# Patient Record
Sex: Female | Born: 1985
Health system: Southern US, Community
[De-identification: ages and names within clinical notes are randomized; demographics above are authoritative.]

## PROBLEM LIST (undated history)

## (undated) ENCOUNTER — Inpatient Hospital Stay (HOSPITAL_COMMUNITY): Payer: Self-pay

## (undated) DIAGNOSIS — K829 Disease of gallbladder, unspecified: Secondary | ICD-10-CM

## (undated) DIAGNOSIS — K219 Gastro-esophageal reflux disease without esophagitis: Secondary | ICD-10-CM

---

## 2010-09-18 ENCOUNTER — Ambulatory Visit: Payer: Self-pay | Admitting: Nurse Practitioner

## 2010-09-18 ENCOUNTER — Inpatient Hospital Stay (HOSPITAL_COMMUNITY): Admission: AD | Admit: 2010-09-18 | Discharge: 2010-09-18 | Payer: Self-pay | Admitting: Obstetrics & Gynecology

## 2011-03-01 LAB — URINALYSIS, ROUTINE W REFLEX MICROSCOPIC
Bilirubin Urine: NEGATIVE
Glucose, UA: NEGATIVE mg/dL
Hgb urine dipstick: NEGATIVE
Ketones, ur: NEGATIVE mg/dL
Nitrite: NEGATIVE
Protein, ur: NEGATIVE mg/dL
Specific Gravity, Urine: >1.03 — ABNORMAL HIGH (ref 1.005–1.030)
Urobilinogen, UA: 1 mg/dL (ref 0.0–1.0)
pH: 6 (ref 5.0–8.0)

## 2011-03-01 LAB — CBC
HCT: 32 % — ABNORMAL LOW (ref 36.0–46.0)
Hemoglobin: 10.6 g/dL — ABNORMAL LOW (ref 12.0–15.0)
MCH: 23 pg — ABNORMAL LOW (ref 26.0–34.0)
MCHC: 33 g/dL (ref 30.0–36.0)
MCV: 69.9 fL — ABNORMAL LOW (ref 78.0–100.0)
Platelets: 174 10*3/uL (ref 150–400)
RBC: 4.58 MIL/uL (ref 3.87–5.11)
RDW: 18.2 % — ABNORMAL HIGH (ref 11.5–15.5)
WBC: 5.5 10*3/uL (ref 4.0–10.5)

## 2011-03-01 LAB — DIFFERENTIAL
Basophils Absolute: 0 10*3/uL (ref 0.0–0.1)
Basophils Relative: 1 % (ref 0–1)
Eosinophils Absolute: 0.1 10*3/uL (ref 0.0–0.7)
Eosinophils Relative: 1 % (ref 0–5)
Lymphocytes Relative: 44 % (ref 12–46)
Lymphs Abs: 2.4 10*3/uL (ref 0.7–4.0)
Monocytes Absolute: 0.4 10*3/uL (ref 0.1–1.0)
Monocytes Relative: 8 % (ref 3–12)
Neutro Abs: 2.5 10*3/uL (ref 1.7–7.7)
Neutrophils Relative %: 47 % (ref 43–77)

## 2011-03-01 LAB — WET PREP, GENITAL
Clue Cells Wet Prep HPF POC: NONE SEEN
Trich, Wet Prep: NONE SEEN
Yeast Wet Prep HPF POC: NONE SEEN

## 2011-03-01 LAB — GC/CHLAMYDIA PROBE AMP, GENITAL
Chlamydia, DNA Probe: NEGATIVE
GC Probe Amp, Genital: NEGATIVE

## 2011-03-01 LAB — POCT PREGNANCY, URINE: Preg Test, Ur: NEGATIVE

## 2011-05-20 ENCOUNTER — Emergency Department (HOSPITAL_COMMUNITY)
Admission: EM | Admit: 2011-05-20 | Discharge: 2011-05-21 | Disposition: A | Discharge status: Home / Self Care | Payer: Self-pay | Attending: Emergency Medicine | Admitting: Emergency Medicine

## 2011-05-20 DIAGNOSIS — R6883 Chills (without fever): Secondary | ICD-10-CM | POA: Insufficient documentation

## 2011-05-20 DIAGNOSIS — K8020 Calculus of gallbladder without cholecystitis without obstruction: Secondary | ICD-10-CM | POA: Insufficient documentation

## 2011-05-20 DIAGNOSIS — R110 Nausea: Secondary | ICD-10-CM | POA: Insufficient documentation

## 2011-05-20 DIAGNOSIS — R1011 Right upper quadrant pain: Secondary | ICD-10-CM | POA: Insufficient documentation

## 2011-05-21 ENCOUNTER — Emergency Department (HOSPITAL_COMMUNITY): Payer: Self-pay

## 2011-05-21 LAB — DIFFERENTIAL
Basophils Absolute: 0 10*3/uL (ref 0.0–0.1)
Basophils Relative: 0 % (ref 0–1)
Eosinophils Relative: 1 % (ref 0–5)
Lymphocytes Relative: 33 % (ref 12–46)
Lymphs Abs: 2.3 10*3/uL (ref 0.7–4.0)
Monocytes Absolute: 0.5 10*3/uL (ref 0.1–1.0)
Monocytes Relative: 6 % (ref 3–12)
Neutro Abs: 4.2 10*3/uL (ref 1.7–7.7)
Neutrophils Relative %: 60 % (ref 43–77)

## 2011-05-21 LAB — CBC
HCT: 34.8 % — ABNORMAL LOW (ref 36.0–46.0)
Platelets: 174 10*3/uL (ref 150–400)
RBC: 4.5 MIL/uL (ref 3.87–5.11)
RDW: 14.7 % (ref 11.5–15.5)
WBC: 7 10*3/uL (ref 4.0–10.5)

## 2011-05-21 LAB — COMPREHENSIVE METABOLIC PANEL
ALT: 19 U/L (ref 0–35)
AST: 20 U/L (ref 0–37)
Albumin: 3.5 g/dL (ref 3.5–5.2)
Alkaline Phosphatase: 91 U/L (ref 39–117)
BUN: 9 mg/dL (ref 6–23)
CO2: 25 mEq/L (ref 19–32)
Calcium: 8.7 mg/dL (ref 8.4–10.5)
Chloride: 105 mEq/L (ref 96–112)
GFR calc Af Amer: >60 mL/min (ref >60)
GFR calc non Af Amer: >60 mL/min (ref >60)
Glucose, Bld: 95 mg/dL (ref 70–99)
Potassium: 3.7 mEq/L (ref 3.5–5.1)
Sodium: 136 mEq/L (ref 135–145)
Total Bilirubin: 0.3 mg/dL (ref 0.3–1.2)
Total Protein: 7.1 g/dL (ref 6.0–8.3)

## 2011-05-21 LAB — LIPASE, BLOOD: Lipase: 30 U/L (ref 11–59)

## 2011-05-21 LAB — URINALYSIS, ROUTINE W REFLEX MICROSCOPIC
Bilirubin Urine: NEGATIVE
Glucose, UA: NEGATIVE mg/dL
Hgb urine dipstick: NEGATIVE
Ketones, ur: NEGATIVE mg/dL
Nitrite: NEGATIVE
Protein, ur: NEGATIVE mg/dL
Specific Gravity, Urine: 1.025 (ref 1.005–1.030)
Urobilinogen, UA: 0.2 mg/dL (ref 0.0–1.0)
pH: 6 (ref 5.0–8.0)

## 2011-05-21 LAB — POCT PREGNANCY, URINE: Preg Test, Ur: NEGATIVE

## 2011-05-21 LAB — URINE MICROSCOPIC-ADD ON

## 2011-09-04 ENCOUNTER — Ambulatory Visit (INDEPENDENT_AMBULATORY_CARE_PROVIDER_SITE_OTHER): Payer: Self-pay | Admitting: Surgery

## 2011-11-15 LAB — OB RESULTS CONSOLE ABO/RH: RH Type: POSITIVE

## 2011-11-15 LAB — OB RESULTS CONSOLE HEPATITIS B SURFACE ANTIGEN: Hepatitis B Surface Ag: NEGATIVE

## 2011-11-15 LAB — OB RESULTS CONSOLE RPR: RPR: NONREACTIVE

## 2011-11-15 LAB — OB RESULTS CONSOLE ANTIBODY SCREEN: Antibody Screen: NEGATIVE

## 2011-11-15 LAB — OB RESULTS CONSOLE RUBELLA ANTIBODY, IGM: Rubella: IMMUNE

## 2011-12-18 NOTE — L&D Delivery Note (Signed)
Delivery Note At 1:03 PM a viable female was delivered via Vaginal, Spontaneous Delivery (Presentation: ;  ).  APGAR: , ; weight .   Placenta status: , .  Cord:  with the following complications: .  Cord pH: not done  Anesthesia: Epidural  Episiotomy:  Lacerations:  Suture Repair: 2.0 chromic Est. Blood Loss (mL):   Mom to postpartum.  Baby to nursery-stable.  Boubacar Lerette A 05/15/2012, 1:11 PM

## 2012-04-10 LAB — OB RESULTS CONSOLE GBS: GBS: NEGATIVE

## 2012-05-15 ENCOUNTER — Encounter (HOSPITAL_COMMUNITY): Payer: Self-pay | Admitting: *Deleted

## 2012-05-15 ENCOUNTER — Inpatient Hospital Stay (HOSPITAL_COMMUNITY)
Admission: AD | Admit: 2012-05-15 | Discharge: 2012-05-17 | DRG: 775 | Disposition: A | Discharge status: Home / Self Care | Payer: Medicaid Other | Source: Ambulatory Visit | Attending: Obstetrics | Admitting: Obstetrics

## 2012-05-15 ENCOUNTER — Encounter (HOSPITAL_COMMUNITY): Payer: Self-pay | Admitting: Anesthesiology

## 2012-05-15 ENCOUNTER — Inpatient Hospital Stay (HOSPITAL_COMMUNITY): Payer: Medicaid Other | Admitting: Anesthesiology

## 2012-05-15 LAB — CBC
HCT: 29.9 % — ABNORMAL LOW (ref 36.0–46.0)
Hemoglobin: 9.3 g/dL — ABNORMAL LOW (ref 12.0–15.0)
MCHC: 31.1 g/dL (ref 30.0–36.0)

## 2012-05-15 MED ORDER — ZOLPIDEM TARTRATE 5 MG PO TABS: 5.0000 mg | ORAL_TABLET | Freq: Every evening | ORAL | Status: DC | PRN

## 2012-05-15 MED ORDER — LIDOCAINE HCL (PF) 1 % IJ SOLN
INTRAMUSCULAR | Status: DC | PRN
  Administered 2012-05-15 (×2): 8 mL

## 2012-05-15 MED ORDER — DIPHENHYDRAMINE HCL 25 MG PO CAPS: 25.0000 mg | ORAL_CAPSULE | Freq: Four times a day (QID) | ORAL | Status: DC | PRN

## 2012-05-15 MED ORDER — WITCH HAZEL-GLYCERIN EX PADS: 1.0000 "application " | MEDICATED_PAD | CUTANEOUS | Status: DC | PRN

## 2012-05-15 MED ORDER — LIDOCAINE HCL (PF) 1 % IJ SOLN
30.0000 mL | INTRAMUSCULAR | Status: DC | PRN
  Filled 2012-05-15: qty 30

## 2012-05-15 MED ORDER — OXYTOCIN BOLUS FROM INFUSION
500.0000 mL | Freq: Once | INTRAVENOUS | Status: DC
  Filled 2012-05-15: qty 500
  Filled 2012-05-15: qty 1000

## 2012-05-15 MED ORDER — PHENYLEPHRINE 40 MCG/ML (10ML) SYRINGE FOR IV PUSH (FOR BLOOD PRESSURE SUPPORT): 80.0000 ug | PREFILLED_SYRINGE | INTRAVENOUS | Status: DC | PRN

## 2012-05-15 MED ORDER — IBUPROFEN 600 MG PO TABS
600.0000 mg | ORAL_TABLET | Freq: Four times a day (QID) | ORAL | Status: DC
  Administered 2012-05-15 – 2012-05-17 (×6): 600 mg via ORAL
  Filled 2012-05-15 (×2): qty 1

## 2012-05-15 MED ORDER — ONDANSETRON HCL 4 MG/2ML IJ SOLN: 4.0000 mg | INTRAMUSCULAR | Status: DC | PRN

## 2012-05-15 MED ORDER — ACETAMINOPHEN 325 MG PO TABS: 650.0000 mg | ORAL_TABLET | ORAL | Status: DC | PRN

## 2012-05-15 MED ORDER — EPHEDRINE 5 MG/ML INJ: 10.0000 mg | INTRAVENOUS | Status: DC | PRN

## 2012-05-15 MED ORDER — DIPHENHYDRAMINE HCL 50 MG/ML IJ SOLN: 12.5000 mg | INTRAMUSCULAR | Status: DC | PRN

## 2012-05-15 MED ORDER — ONDANSETRON HCL 4 MG PO TABS: 4.0000 mg | ORAL_TABLET | ORAL | Status: DC | PRN

## 2012-05-15 MED ORDER — SENNOSIDES-DOCUSATE SODIUM 8.6-50 MG PO TABS
2.0000 | ORAL_TABLET | Freq: Every day | ORAL | Status: DC
  Administered 2012-05-15: 2 via ORAL

## 2012-05-15 MED ORDER — FLEET ENEMA 7-19 GM/118ML RE ENEM: 1.0000 | ENEMA | RECTAL | Status: DC | PRN

## 2012-05-15 MED ORDER — OXYTOCIN 20 UNITS IN LACTATED RINGERS INFUSION - SIMPLE
125.0000 mL/h | Freq: Once | INTRAVENOUS | Status: AC
  Administered 2012-05-15: 12999 mL/h via INTRAVENOUS

## 2012-05-15 MED ORDER — DIBUCAINE 1 % RE OINT: 1.0000 "application " | TOPICAL_OINTMENT | RECTAL | Status: DC | PRN

## 2012-05-15 MED ORDER — LACTATED RINGERS IV SOLN
INTRAVENOUS | Status: DC
  Administered 2012-05-15 (×3): via INTRAVENOUS

## 2012-05-15 MED ORDER — FENTANYL 2.5 MCG/ML BUPIVACAINE 1/10 % EPIDURAL INFUSION (WH - ANES)
14.0000 mL/h | INTRAMUSCULAR | Status: DC
  Administered 2012-05-15: 14 mL/h via EPIDURAL
  Filled 2012-05-15 (×2): qty 60

## 2012-05-15 MED ORDER — PRENATAL MULTIVITAMIN CH
1.0000 | ORAL_TABLET | Freq: Every day | ORAL | Status: DC
  Administered 2012-05-16 – 2012-05-17 (×2): 1 via ORAL
  Filled 2012-05-15 (×2): qty 1

## 2012-05-15 MED ORDER — OXYCODONE-ACETAMINOPHEN 5-325 MG PO TABS: 1.0000 | ORAL_TABLET | ORAL | Status: DC | PRN

## 2012-05-15 MED ORDER — FERROUS SULFATE 325 (65 FE) MG PO TABS
325.0000 mg | ORAL_TABLET | Freq: Two times a day (BID) | ORAL | Status: DC
  Administered 2012-05-16 – 2012-05-17 (×3): 325 mg via ORAL
  Filled 2012-05-15 (×3): qty 1

## 2012-05-15 MED ORDER — CITRIC ACID-SODIUM CITRATE 334-500 MG/5ML PO SOLN: 30.0000 mL | ORAL | Status: DC | PRN

## 2012-05-15 MED ORDER — FENTANYL 2.5 MCG/ML BUPIVACAINE 1/10 % EPIDURAL INFUSION (WH - ANES)
INTRAMUSCULAR | Status: DC | PRN
  Administered 2012-05-15: 14 mL/h via EPIDURAL

## 2012-05-15 MED ORDER — IBUPROFEN 600 MG PO TABS
600.0000 mg | ORAL_TABLET | Freq: Four times a day (QID) | ORAL | Status: DC | PRN
  Filled 2012-05-15 (×5): qty 1

## 2012-05-15 MED ORDER — ONDANSETRON HCL 4 MG/2ML IJ SOLN: 4.0000 mg | Freq: Four times a day (QID) | INTRAMUSCULAR | Status: DC | PRN

## 2012-05-15 MED ORDER — LACTATED RINGERS IV SOLN: 500.0000 mL | INTRAVENOUS | Status: DC | PRN

## 2012-05-15 MED ORDER — BUTORPHANOL TARTRATE 2 MG/ML IJ SOLN: 1.0000 mg | INTRAMUSCULAR | Status: DC | PRN

## 2012-05-15 MED ORDER — BENZOCAINE-MENTHOL 20-0.5 % EX AERO: 1.0000 "application " | INHALATION_SPRAY | CUTANEOUS | Status: DC | PRN

## 2012-05-15 MED ORDER — LANOLIN HYDROUS EX OINT: TOPICAL_OINTMENT | CUTANEOUS | Status: DC | PRN

## 2012-05-15 MED ORDER — TETANUS-DIPHTH-ACELL PERTUSSIS 5-2.5-18.5 LF-MCG/0.5 IM SUSP
0.5000 mL | Freq: Once | INTRAMUSCULAR | Status: AC
  Administered 2012-05-17: 0.5 mL via INTRAMUSCULAR

## 2012-05-15 MED ORDER — SIMETHICONE 80 MG PO CHEW: 80.0000 mg | CHEWABLE_TABLET | ORAL | Status: DC | PRN

## 2012-05-15 MED ORDER — EPHEDRINE 5 MG/ML INJ
10.0000 mg | INTRAVENOUS | Status: DC | PRN
  Filled 2012-05-15: qty 4

## 2012-05-15 MED ORDER — LACTATED RINGERS IV SOLN: 500.0000 mL | Freq: Once | INTRAVENOUS | Status: DC

## 2012-05-15 MED ORDER — PHENYLEPHRINE 40 MCG/ML (10ML) SYRINGE FOR IV PUSH (FOR BLOOD PRESSURE SUPPORT)
80.0000 ug | PREFILLED_SYRINGE | INTRAVENOUS | Status: DC | PRN
  Filled 2012-05-15: qty 5

## 2012-05-15 NOTE — MAU Note (Signed)
Pt reports per interpreter she has been having contractions since midnight , also reports some bloody discharge. Reports good fetal movement. Denies problems with pregnancy

## 2012-05-15 NOTE — Progress Notes (Signed)
[facility] Department of Clinical Social Work Documentation of Interpretation   I assisted __Lyz RN_________________ with interpretation of _admission_____________________ for this patient.

## 2012-05-15 NOTE — Anesthesia Procedure Notes (Signed)
Epidural Patient location during procedure: OB Start time: 05/15/2012 9:23 AM End time: 05/15/2012 9:27 AM Reason for block: procedure for pain  Staffing Anesthesiologist: Sandrea Hughs  Preanesthetic Checklist Completed: patient identified, site marked, surgical consent, pre-op evaluation, timeout performed, IV checked, risks and benefits discussed and monitors and equipment checked  Epidural Patient position: sitting Prep: site prepped and draped and DuraPrep Patient monitoring: continuous pulse ox and blood pressure Approach: midline Injection technique: LOR air  Needle:  Needle type: Tuohy  Needle gauge: 17 G Needle length: 9 cm Needle insertion depth: 5 cm cm Catheter type: closed end flexible Catheter size: 19 Gauge Catheter at skin depth: 10 cm Test dose: negative and Other  Assessment Sensory level: T9 Events: blood not aspirated, injection not painful, no injection resistance, negative IV test and no paresthesia

## 2012-05-15 NOTE — Progress Notes (Signed)
[facility] Department of Clinical Social Work Documentation of Interpretation   I assisted __Dr Hatchett_________________ with interpretation of _epidural_____________________ for this patient. 

## 2012-05-15 NOTE — H&P (Signed)
This is Dr. Francoise Ceo dictating the history and physical on  Amy Lloyd she's a 27 year old gravida 2 para 1001 who is due date is 05/14/2012 she's a 40 and 1 and was admitted in labor negative GBS her cervix is now 5-6 cm dilated -3 station vertex 90% effaced AROM performed thin meconium she has an epidural Past medical history negative Past surgical history negative Social history denies smoking or alcohol use or drug use System review negative Physical exam HEENT negative Breasts negative Lungs clear Heart regular rhythm no murmurs no gallops Abdomen term Pelvic as described above

## 2012-05-15 NOTE — Anesthesia Preprocedure Evaluation (Signed)
Anesthesia Evaluation  Patient identified by MRN, date of birth, ID band Patient awake    Reviewed: Allergy & Precautions, H&P , NPO status , Patient's Chart, lab work & pertinent test results  Airway Mallampati: III TM Distance: >3 FB Neck ROM: full    Dental No notable dental hx.    Pulmonary neg pulmonary ROS,    Pulmonary exam normal       Cardiovascular negative cardio ROS      Neuro/Psych negative neurological ROS  negative psych ROS   GI/Hepatic negative GI ROS, Neg liver ROS,   Endo/Other  Morbid obesity  Renal/GU negative Renal ROS  negative genitourinary   Musculoskeletal   Abdominal (+) + obese,   Peds  Hematology negative hematology ROS (+)   Anesthesia Other Findings   Reproductive/Obstetrics (+) Pregnancy                           Anesthesia Physical Anesthesia Plan  ASA: III  Anesthesia Plan: Epidural   Post-op Pain Management:    Induction:   Airway Management Planned:   Additional Equipment:   Intra-op Plan:   Post-operative Plan:   Informed Consent: I have reviewed the patients History and Physical, chart, labs and discussed the procedure including the risks, benefits and alternatives for the proposed anesthesia with the patient or authorized representative who has indicated his/her understanding and acceptance.     Plan Discussed with:   Anesthesia Plan Comments:         Anesthesia Quick Evaluation

## 2012-05-15 NOTE — Progress Notes (Signed)
[facility] Department of Clinical Social Work Documentation of Interpretation   I assisted ___Judy  RN________________ with interpretation of ____questions__________________ for this patient. 

## 2012-05-15 NOTE — Progress Notes (Signed)
[facility] Department of Clinical Social Work Documentation of Interpretation   I assisted ____Anita RN______________ with interpretation of ____questions__________________ for this patient.

## 2012-05-16 LAB — CBC
HCT: 28 % — ABNORMAL LOW (ref 36.0–46.0)
Hemoglobin: 8.8 g/dL — ABNORMAL LOW (ref 12.0–15.0)
RBC: 3.81 MIL/uL — ABNORMAL LOW (ref 3.87–5.11)
WBC: 9.6 10*3/uL (ref 4.0–10.5)

## 2012-05-16 NOTE — Progress Notes (Signed)
UR chart review completed.  

## 2012-05-16 NOTE — Anesthesia Postprocedure Evaluation (Signed)
Anesthesia Post Note  Patient: Amy Lloyd  Procedure(s) Performed: * No procedures listed *  Anesthesia type: Epidural  Patient location: Mother/Baby  Post pain: Pain level controlled  Post assessment: Post-op Vital signs reviewed  Last Vitals:  Filed Vitals:   05/16/12 0527  BP: 118/78  Pulse: 83  Temp: 36.8 C  Resp: 18    Post vital signs: Reviewed  Level of consciousness: awake  Complications: No apparent anesthesia complications

## 2012-05-16 NOTE — Progress Notes (Signed)
Patient ID: Amy Lloyd, female   DOB: [birth year], 26 y.o.   MRN: [id number] Postpartum day one Vital signs normal Fundus firm Lochia moderate Legs negative

## 2012-05-16 NOTE — Progress Notes (Signed)
[facility] Department of Clinical Social Work Documentation of Interpretation   I assisted ___________________ with interpretation of _stopped by to check on patient_____________________ for this patient. 

## 2012-05-17 MED ORDER — IBUPROFEN 600 MG PO TABS: 600.0000 mg | ORAL_TABLET | Freq: Four times a day (QID) | ORAL | Status: DC

## 2012-05-17 MED ORDER — OXYCODONE-ACETAMINOPHEN 5-325 MG PO TABS: 1.0000 | ORAL_TABLET | ORAL | Status: AC | PRN

## 2012-05-17 NOTE — Progress Notes (Signed)
Post Partum Day 2 Subjective: no complaints  Objective: Blood pressure 109/75, pulse 85, temperature 98 F (36.7 C), temperature source Oral, resp. rate 18, height 5\' 1"  (1.549 m), weight 83.008 kg (183 lb), SpO2 98.00%, unknown if currently breastfeeding.  Physical Exam:  General: alert and no distress Lochia: appropriate Uterine Fundus: firm Incision: healing well DVT Evaluation: No evidence of DVT seen on physical exam.   Basename 05/16/12 0010 05/15/12 0825  HGB 8.8* 9.3*  HCT 28.0* 29.9*    Assessment/Plan: Discharge home   LOS: 2 days   Adric Wrede A 05/17/2012, 10:07 AM

## 2012-05-17 NOTE — Discharge Summary (Signed)
Obstetric Discharge Summary Reason for Admission: onset of labor Prenatal Procedures: ultrasound Intrapartum Procedures: spontaneous vaginal delivery Postpartum Procedures: none Complications-Operative and Postpartum: none Hemoglobin  Date Value Range Status  05/16/2012 8.8* 12.0-15.0 (g/dL) Final     HCT  Date Value Range Status  05/16/2012 28.0* 36.0-46.0 (%) Final    Physical Exam:  General: alert and no distress Lochia: appropriate Uterine Fundus: firm Incision: healing well DVT Evaluation: No evidence of DVT seen on physical exam.  Discharge Diagnoses: Term Pregnancy-delivered  Discharge Information: Date: 05/17/2012 Activity: pelvic rest Diet: routine Medications: PNV, Ibuprofen, Colace and Percocet Condition: stable Instructions: refer to practice specific booklet Discharge to: home Follow-up Information    Follow up with MARSHALL,BERNARD A, MD. Schedule an appointment as soon as possible for a visit in 6 weeks.   Contact information:   [address] [place] [address] [phone]          Newborn Data: Live born female  Birth Weight: 6 lb 8.1 oz (2951 g) APGAR: 9, 9  Home with mother.  Amy Lloyd A 05/17/2012, 10:13 AM

## 2013-10-28 ENCOUNTER — Encounter (HOSPITAL_COMMUNITY): Payer: Self-pay | Admitting: Emergency Medicine

## 2013-10-28 DIAGNOSIS — R1011 Right upper quadrant pain: Secondary | ICD-10-CM | POA: Insufficient documentation

## 2013-10-28 DIAGNOSIS — R112 Nausea with vomiting, unspecified: Secondary | ICD-10-CM | POA: Insufficient documentation

## 2013-10-28 LAB — CBC WITH DIFFERENTIAL/PLATELET
Eosinophils Relative: 1 % (ref 0–5)
HCT: 39.1 % (ref 36.0–46.0)
Lymphocytes Relative: 32 % (ref 12–46)
Lymphs Abs: 2.6 10*3/uL (ref 0.7–4.0)
MCV: 84.1 fL (ref 78.0–100.0)
Monocytes Absolute: 0.5 10*3/uL (ref 0.1–1.0)
RBC: 4.65 MIL/uL (ref 3.87–5.11)
WBC: 8.2 10*3/uL (ref 4.0–10.5)

## 2013-10-28 LAB — COMPREHENSIVE METABOLIC PANEL
ALT: 21 U/L (ref 0–35)
CO2: 27 mEq/L (ref 19–32)
Calcium: 9.1 mg/dL (ref 8.4–10.5)
Chloride: 102 mEq/L (ref 96–112)
GFR calc Af Amer: >90 mL/min (ref >90)
GFR calc non Af Amer: >90 mL/min (ref >90)
Glucose, Bld: 90 mg/dL (ref 70–99)
Sodium: 138 mEq/L (ref 135–145)
Total Bilirubin: 0.4 mg/dL (ref 0.3–1.2)

## 2013-10-28 NOTE — ED Notes (Signed)
Pt c/o RUQ abdominal pain that started this morning, with nausea and vomiting. Pain radiates to back.

## 2013-10-29 ENCOUNTER — Emergency Department (HOSPITAL_COMMUNITY)
Admission: EM | Admit: 2013-10-29 | Discharge: 2013-10-29 | Discharge status: Against Medical Advice | Payer: Medicaid Other | Attending: Emergency Medicine | Admitting: Emergency Medicine

## 2014-10-18 ENCOUNTER — Encounter (HOSPITAL_COMMUNITY): Payer: Self-pay | Admitting: Emergency Medicine

## 2015-09-25 ENCOUNTER — Emergency Department (HOSPITAL_COMMUNITY): Payer: Medicaid Other

## 2015-09-25 ENCOUNTER — Encounter (HOSPITAL_COMMUNITY): Payer: Self-pay | Admitting: Emergency Medicine

## 2015-09-25 ENCOUNTER — Observation Stay (HOSPITAL_COMMUNITY)
Admission: EM | Admit: 2015-09-25 | Discharge: 2015-09-27 | Disposition: A | Discharge status: Home / Self Care | Payer: Medicaid Other | Attending: General Surgery | Admitting: General Surgery

## 2015-09-25 DIAGNOSIS — K353 Acute appendicitis with localized peritonitis: Secondary | ICD-10-CM | POA: Diagnosis not present

## 2015-09-25 DIAGNOSIS — K3533 Acute appendicitis with perforation and localized peritonitis, with abscess: Secondary | ICD-10-CM | POA: Diagnosis present

## 2015-09-25 DIAGNOSIS — R109 Unspecified abdominal pain: Secondary | ICD-10-CM | POA: Diagnosis present

## 2015-09-25 DIAGNOSIS — R1084 Generalized abdominal pain: Secondary | ICD-10-CM

## 2015-09-25 HISTORY — DX: Disease of gallbladder, unspecified: K82.9

## 2015-09-25 LAB — COMPREHENSIVE METABOLIC PANEL
ALBUMIN: 3.6 g/dL (ref 3.5–5.0)
ALT: 22 U/L (ref 14–54)
ANION GAP: 8 (ref 5–15)
AST: 31 U/L (ref 15–41)
Alkaline Phosphatase: 87 U/L (ref 38–126)
BUN: 12 mg/dL (ref 6–20)
CHLORIDE: 100 mmol/L — AB (ref 101–111)
CO2: 27 mmol/L (ref 22–32)
Calcium: 8.9 mg/dL (ref 8.9–10.3)
Creatinine, Ser: 0.81 mg/dL (ref 0.44–1.00)
GFR calc Af Amer: >60 mL/min (ref >60)
GFR calc non Af Amer: >60 mL/min (ref >60)
GLUCOSE: 101 mg/dL — AB (ref 65–99)
POTASSIUM: 3.4 mmol/L — AB (ref 3.5–5.1)
SODIUM: 135 mmol/L (ref 135–145)
Total Bilirubin: 0.7 mg/dL (ref 0.3–1.2)
Total Protein: 7.3 g/dL (ref 6.5–8.1)

## 2015-09-25 LAB — CBC
HCT: 40.9 % (ref 36.0–46.0)
Hemoglobin: 14 g/dL (ref 12.0–15.0)
MCH: 29.7 pg (ref 26.0–34.0)
MCHC: 34.2 g/dL (ref 30.0–36.0)
MCV: 86.8 fL (ref 78.0–100.0)
PLATELETS: 180 10*3/uL (ref 150–400)
RBC: 4.71 MIL/uL (ref 3.87–5.11)
RDW: 12.1 % (ref 11.5–15.5)
WBC: 9.5 10*3/uL (ref 4.0–10.5)

## 2015-09-25 LAB — URINE MICROSCOPIC-ADD ON

## 2015-09-25 LAB — URINALYSIS, ROUTINE W REFLEX MICROSCOPIC
BILIRUBIN URINE: NEGATIVE
Glucose, UA: NEGATIVE mg/dL
KETONES UR: NEGATIVE mg/dL
NITRITE: NEGATIVE
PH: 7.5 (ref 5.0–8.0)
PROTEIN: NEGATIVE mg/dL
Specific Gravity, Urine: 1.02 (ref 1.005–1.030)
Urobilinogen, UA: 1 mg/dL (ref 0.0–1.0)

## 2015-09-25 LAB — LIPASE, BLOOD: Lipase: 25 U/L (ref 22–51)

## 2015-09-25 LAB — POC URINE PREG, ED: Preg Test, Ur: NEGATIVE

## 2015-09-25 MED ORDER — PIPERACILLIN-TAZOBACTAM 3.375 G IVPB 30 MIN
3.3750 g | Freq: Once | INTRAVENOUS | Status: AC
  Administered 2015-09-25: 3.375 g via INTRAVENOUS
  Filled 2015-09-25: qty 50

## 2015-09-25 MED ORDER — MORPHINE SULFATE (PF) 4 MG/ML IV SOLN
4.0000 mg | Freq: Once | INTRAVENOUS | Status: AC
  Administered 2015-09-25: 4 mg via INTRAVENOUS
  Filled 2015-09-25: qty 1

## 2015-09-25 MED ORDER — SODIUM CHLORIDE 0.9 % IV BOLUS (SEPSIS)
1000.0000 mL | Freq: Once | INTRAVENOUS | Status: AC
  Administered 2015-09-25: 1000 mL via INTRAVENOUS

## 2015-09-25 MED ORDER — IOHEXOL 300 MG/ML  SOLN
100.0000 mL | Freq: Once | INTRAMUSCULAR | Status: AC | PRN
  Administered 2015-09-25: 100 mL via INTRAVENOUS

## 2015-09-25 MED ORDER — ONDANSETRON HCL 4 MG/2ML IJ SOLN
4.0000 mg | Freq: Once | INTRAMUSCULAR | Status: AC
  Administered 2015-09-25: 4 mg via INTRAVENOUS
  Filled 2015-09-25: qty 2

## 2015-09-25 NOTE — ED Notes (Signed)
Pt c/o abdominal pain onset 0300 today. Pt denies N/V.

## 2015-09-25 NOTE — ED Provider Notes (Signed)
CSN: [id number]     Arrival date & time 09/25/15  1711 History   First MD Initiated Contact with Patient 09/25/15 1953     Chief Complaint  Patient presents with  . Abdominal Pain   Amy Lloyd is a 29 y.o. female with no significant past medical history who presents for abdominal pain and nausea. The patient reports that approximately 3 AM this morning, the patient was awoken by sudden onset abdominal pain. The patient scrubbed the pain as going all the way across her lower abdomen. The patient describes the pain as an 8 out of 10 in severity, and nonradiating. The patient describes the pain as sharp The patient denies fevers, chills, constipation, diarrhea, or dysuria. The patient does report that she has had some intermittent vaginal bleeding this month. The patient denies any vaginal bleeding today. The patient says she is having nausea but no emesis. The patient's initial never felt this type of pain before. The patient denies any abdominal trauma, or any other symptoms on arrival.   (Consider location/radiation/quality/duration/timing/severity/associated sxs/prior Treatment) Patient is a 29 y.o. female presenting with abdominal pain. The history is provided by the patient. The history is limited by a language barrier. A language interpreter was used (Spanish interpreter).  Abdominal Pain Pain location:  Suprapubic, LLQ and RLQ Pain quality: sharp   Pain radiates to:  Does not radiate Pain severity:  Severe Onset quality:  Sudden Duration:  1 day Timing:  Constant Progression:  Worsening Chronicity:  New Context: not previous surgeries   Relieved by:  Nothing Ineffective treatments:  None tried Associated symptoms: anorexia, nausea and vaginal bleeding (earlier this month but not today)   Associated symptoms: no chest pain, no chills, no constipation, no cough, no diarrhea, no dysuria, no fatigue, no fever, no hematemesis, no shortness of breath, no vaginal discharge and no  vomiting   Nausea:    Severity:  Moderate   Onset quality:  Gradual   Duration:  1 day   Timing:  Intermittent   Progression:  Waxing and waning Vaginal bleeding:    Quality:  Spotting   Severity:  Mild   Progression:  Resolved Risk factors: not obese     Past Medical History  Diagnosis Date  . No pertinent past medical history    Past Surgical History  Procedure Laterality Date  . No past surgeries     No family history on file. Social History  Substance Use Topics  . Smoking status: Never Smoker   . Smokeless tobacco: None  . Alcohol Use: No   OB History    Gravida Para Term Preterm AB TAB SAB Ectopic Multiple Living   Review of Systems  Constitutional: Negative for fever, chills and fatigue.  HENT: Negative for congestion and rhinorrhea.   Eyes: Negative for visual disturbance.  Respiratory: Negative for cough, chest tightness, shortness of breath, wheezing and stridor.   Cardiovascular: Negative for chest pain, palpitations and leg swelling.  Gastrointestinal: Positive for nausea, abdominal pain and anorexia. Negative for vomiting, diarrhea, constipation, abdominal distention and hematemesis.  Genitourinary: Positive for vaginal bleeding (earlier this month but not today). Negative for dysuria, frequency, flank pain, vaginal discharge and vaginal pain.  Musculoskeletal: Negative for neck pain and neck stiffness.  Skin: Negative for rash and wound.  Neurological: Negative for dizziness and headaches.  Psychiatric/Behavioral: Negative for agitation.  All other systems reviewed and are negative.  Allergies  Review of patient's allergies indicates no known allergies.  Home Medications   Prior to Admission medications   Not on File   BP 127/78 mmHg  Pulse 105  Temp(Src) 99.2 F (37.3 C) (Oral)  Resp 16  Wt 164 lb 3.2 oz (74.481 kg)  SpO2 100%  LMP 09/03/2015  Breastfeeding? No Physical Exam  Constitutional: She is oriented to  person, place, and time. She appears well-developed and well-nourished. No distress.  HENT:  Head: Normocephalic and atraumatic.  Mouth/Throat: No oropharyngeal exudate.  Eyes: Conjunctivae and EOM are normal. Pupils are equal, round, and reactive to light.  Cardiovascular: Normal rate, normal heart sounds and intact distal pulses.   No murmur heard. Pulmonary/Chest: Effort normal. No stridor. No respiratory distress. She has no wheezes. She exhibits no tenderness.  Abdominal: Soft. Bowel sounds are normal. There is tenderness in the right lower quadrant, suprapubic area and left lower quadrant. There is no rebound, no guarding and no CVA tenderness.    Genitourinary: Uterus is not tender. Cervix exhibits discharge (Clear discharge). Cervix exhibits no motion tenderness. Right adnexum displays no tenderness. Left adnexum displays no tenderness.  Musculoskeletal: She exhibits no tenderness.  Neurological: She is alert and oriented to person, place, and time. She exhibits normal muscle tone.  Skin: Skin is warm. She is not diaphoretic. No erythema.  Psychiatric: She has a normal mood and affect.  Nursing note and vitals reviewed.   ED Course  Procedures (including critical care time) Labs Review Labs Reviewed  COMPREHENSIVE METABOLIC PANEL - Abnormal; Notable for the following:    Potassium 3.4 (*)    Chloride 100 (*)    Glucose, Bld 101 (*)    All other components within normal limits  URINALYSIS, ROUTINE W REFLEX MICROSCOPIC (NOT AT [facility]) - Abnormal; Notable for the following:    APPearance CLOUDY (*)    Hgb urine dipstick MODERATE (*)    Leukocytes, UA TRACE (*)    All other components within normal limits  URINE MICROSCOPIC-ADD ON - Abnormal; Notable for the following:    Squamous Epithelial / LPF FEW (*)    Bacteria, UA MANY (*)    All other components within normal limits  CULTURE, BLOOD (ROUTINE X 2)  CULTURE, BLOOD (ROUTINE X 2)  LIPASE, BLOOD  CBC  POC URINE PREG, ED    GC/CHLAMYDIA PROBE AMP () NOT AT [facility]    Imaging Review Ct Abdomen Pelvis W Contrast  09/25/2015   CLINICAL DATA:  29 year old female with periumbilical pain and nausea since 0300 hr today. Initial encounter.  EXAM: CT ABDOMEN AND PELVIS WITH CONTRAST  TECHNIQUE: Multidetector CT imaging of the abdomen and pelvis was performed using the standard protocol following bolus administration of intravenous contrast.  CONTRAST:  OMNIPAQUE IOHEXOL 300 MG/ML  SOLN  COMPARISON:  Abdomen ultrasound 05/21/2011.  FINDINGS: Mild respiratory motion and dependent atelectasis at the lung bases. No pericardial or pleural effusion.  No acute osseous abnormality identified. L4-L5 central disc protrusion.  Small volume pelvic free fluid in the cul-de-sac. IUD. Physiologic bilateral adnexal cysts suspected, otherwise negative uterus and adnexa. Mostly decompressed distal colon. Diminutive urinary bladder. Mostly decompressed sigmoid colon.  Negative left colon, transverse colon, hepatic flexure and right colon. The cecum is not inflamed. Negative terminal ileum.  The appendix does not contain gas. The base of the appendix appears largely unremarkable (coronal image 36), however, there is progressive enlargement and irregular appearance of the appendix distally towards the tip. There is soft  tissue thickening and stranding about the tip best seen on coronal image 41. The appendix in this region measures up to 8 mm diameter. It appears to contain fluid throughout its course. The soft tissue thickening in this region is somewhat inseparable from the right adnexa and some decompressed small bowel loops in the region. No extraluminal gas. No free fluid.  No dilated small bowel.  Decompressed stomach and duodenum.  No pneumoperitoneum. Liver, gallbladder, spleen, pancreas, adrenal glands, and major arterial structures in the abdomen and pelvis are within normal limits. Portal venous system appears to remain patent.  Punctate bilateral nephrolithiasis. No perinephric stranding. The right ureter is also in proximity to the abnormal appendix. No hydroureter. No lymphadenopathy.  IMPRESSION: 1. Acute appendicitis relatively sparing the base of the appendix at this time, most pronounced at the tip. Soft tissue thickening and stranding surrounding the tip of the appendix is also inseparable from the nearby right ureter, right adnexa, and regional decompressed small bowel loops. See coronal image 41. 2. No free air or evidence of perforation at this time. Trace amount of pelvic free fluid could be reactive or physiologic in this female patient.   Electronically Signed   By: Odessa Fleming M.D.   On: 09/25/2015 22:26   I have personally reviewed and evaluated these images and lab results as part of my medical decision-making.   EKG Interpretation None      MDM   Amy Lloyd is a 29 y.o. female with no significant past medical history who presents for abdominal pain and nausea. As the patient's pain is located all across her lower abdomen, initial concern for possible appendicitis versus a GU cause of her symptoms. The patient was tachycardic on arrival and continued to be tachycardic. The patient was given fluids. A pelvic exam was performed revealing some clear discharge however, the patient did not have any cervical motion tenderness, uterine tenderness or adnexal tenderness. The decision was made to obtain imaging studies including CT of the abdomen and pelvis as well as a pelvic ultrasound.  The patient's previous test was negative, her urinalysis showed moderate hemoglobin and trace leuks. The patient's CMP revealed a normal kidney function. The patient's lipase was normal and her CBC did not show a significant leukocytosis or anemia.  The patient's CT of the abdomen and pelvis revealed acute appendicitis. The patient had blood cultures obtained, antibiotics were ordered, more fluids were given, and the surgery  team was called for management recommendations.  The patient will be admitted to the surgery service for further management of her acute appendicitis.  This patient was seen with Dr. Jodi Mourning, emergency medicine attending.  Final diagnoses:  Abdominal pain       Theda Belfast, MD 09/26/15 1610  Blane Ohara, MD 09/28/15 2011

## 2015-09-25 NOTE — H&P (Signed)
Amy Lloyd is an 29 y.o. female.   Chief Complaint: Lower abdominal pain HPI: This is a healthy 29 yo female who presents with a one-day history of lower abdominal pain and nausea.  She denies any recent illness.  She awoke with lower abdominal pain that seems worse on the right side.  She presented to the ED for evaluation.  Pelvic examination was unremarkable, but CT scan showed acute tip appendicitis with no sign of perforation or abscess.  Past Medical History  Diagnosis Date  . No pertinent past medical history     Past Surgical History  Procedure Laterality Date  . No past surgeries      No family history on file. Social History:  reports that she has never smoked. She does not have any smokeless tobacco history on file. She reports that she does not drink alcohol or use illicit drugs.  Allergies: No Known Allergies  Prior to Admission medications   Not on File     Results for orders placed or performed during the hospital encounter of 09/25/15 (from the past 48 hour(s))  Lipase, blood     Status: None   Collection Time: 09/25/15  6:00 PM  Result Value Ref Range   Lipase 25 22 - 51 U/L  Comprehensive metabolic panel     Status: Abnormal   Collection Time: 09/25/15  6:00 PM  Result Value Ref Range   Sodium 135 135 - 145 mmol/L   Potassium 3.4 (L) 3.5 - 5.1 mmol/L   Chloride 100 (L) 101 - 111 mmol/L   CO2 27 22 - 32 mmol/L   Glucose, Bld 101 (H) 65 - 99 mg/dL   BUN 12 6 - 20 mg/dL   Creatinine, Ser 0.81 0.44 - 1.00 mg/dL   Calcium 8.9 8.9 - 10.3 mg/dL   Total Protein 7.3 6.5 - 8.1 g/dL   Albumin 3.6 3.5 - 5.0 g/dL   AST 31 15 - 41 U/L   ALT 22 14 - 54 U/L   Alkaline Phosphatase 87 38 - 126 U/L   Total Bilirubin 0.7 0.3 - 1.2 mg/dL   GFR calc non Af Amer >60 >60 mL/min   GFR calc Af Amer >60 >60 mL/min    Comment: (NOTE) The eGFR has been calculated using the CKD EPI equation. This calculation has not been validated in all clinical situations. eGFR's  persistently <60 mL/min signify possible Chronic Kidney Disease.    Anion gap 8 5 - 15  CBC     Status: None   Collection Time: 09/25/15  6:00 PM  Result Value Ref Range   WBC 9.5 4.0 - 10.5 K/uL   RBC 4.71 3.87 - 5.11 MIL/uL   Hemoglobin 14.0 12.0 - 15.0 g/dL   HCT 40.9 36.0 - 46.0 %   MCV 86.8 78.0 - 100.0 fL   MCH 29.7 26.0 - 34.0 pg   MCHC 34.2 30.0 - 36.0 g/dL   RDW 12.1 11.5 - 15.5 %   Platelets 180 150 - 400 K/uL  Urinalysis, Routine w reflex microscopic (not at [facility])     Status: Abnormal   Collection Time: 09/25/15  8:42 PM  Result Value Ref Range   Color, Urine YELLOW YELLOW   APPearance CLOUDY (A) CLEAR   Specific Gravity, Urine 1.020 1.005 - 1.030   pH 7.5 5.0 - 8.0   Glucose, UA NEGATIVE NEGATIVE mg/dL   Hgb urine dipstick MODERATE (A) NEGATIVE   Bilirubin Urine NEGATIVE NEGATIVE   Ketones, ur NEGATIVE NEGATIVE  mg/dL   Protein, ur NEGATIVE NEGATIVE mg/dL   Urobilinogen, UA 1.0 0.0 - 1.0 mg/dL   Nitrite NEGATIVE NEGATIVE   Leukocytes, UA TRACE (A) NEGATIVE  Urine microscopic-add on     Status: Abnormal   Collection Time: 09/25/15  8:42 PM  Result Value Ref Range   Squamous Epithelial / LPF FEW (A) RARE   WBC, UA 7-10 <3 WBC/hpf   RBC / HPF 3-6 <3 RBC/hpf   Bacteria, UA MANY (A) RARE  POC Urine Pregnancy, ED (do NOT order at [facility])     Status: None   Collection Time: 09/25/15  8:58 PM  Result Value Ref Range   Preg Test, Ur NEGATIVE NEGATIVE    Comment:        THE SENSITIVITY OF THIS METHODOLOGY IS >24 mIU/mL    Ct Abdomen Pelvis W Contrast  09/25/2015   CLINICAL DATA:  29 year old female with periumbilical pain and nausea since 0300 hr today. Initial encounter.  EXAM: CT ABDOMEN AND PELVIS WITH CONTRAST  TECHNIQUE: Multidetector CT imaging of the abdomen and pelvis was performed using the standard protocol following bolus administration of intravenous contrast.  CONTRAST:  15m OMNIPAQUE IOHEXOL 300 MG/ML  SOLN  COMPARISON:  Abdomen ultrasound 05/21/2011.   FINDINGS: Mild respiratory motion and dependent atelectasis at the lung bases. No pericardial or pleural effusion.  No acute osseous abnormality identified. L4-L5 central disc protrusion.  Small volume pelvic free fluid in the cul-de-sac. IUD. Physiologic bilateral adnexal cysts suspected, otherwise negative uterus and adnexa. Mostly decompressed distal colon. Diminutive urinary bladder. Mostly decompressed sigmoid colon.  Negative left colon, transverse colon, hepatic flexure and right colon. The cecum is not inflamed. Negative terminal ileum.  The appendix does not contain gas. The base of the appendix appears largely unremarkable (coronal image 36), however, there is progressive enlargement and irregular appearance of the appendix distally towards the tip. There is soft tissue thickening and stranding about the tip best seen on coronal image 41. The appendix in this region measures up to 8 mm diameter. It appears to contain fluid throughout its course. The soft tissue thickening in this region is somewhat inseparable from the right adnexa and some decompressed small bowel loops in the region. No extraluminal gas. No free fluid.  No dilated small bowel.  Decompressed stomach and duodenum.  No pneumoperitoneum. Liver, gallbladder, spleen, pancreas, adrenal glands, and major arterial structures in the abdomen and pelvis are within normal limits. Portal venous system appears to remain patent. Punctate bilateral nephrolithiasis. No perinephric stranding. The right ureter is also in proximity to the abnormal appendix. No hydroureter. No lymphadenopathy.  IMPRESSION: 1. Acute appendicitis relatively sparing the base of the appendix at this time, most pronounced at the tip. Soft tissue thickening and stranding surrounding the tip of the appendix is also inseparable from the nearby right ureter, right adnexa, and regional decompressed small bowel loops. See coronal image 41. 2. No free air or evidence of perforation at  this time. Trace amount of pelvic free fluid could be reactive or physiologic in this female patient.   Electronically Signed   By: HGenevie AnnM.D.   On: 09/25/2015 22:26    Review of Systems  Constitutional: Negative for weight loss.  HENT: Negative for ear discharge, ear pain, hearing loss and tinnitus.   Eyes: Negative for blurred vision, double vision, photophobia and pain.  Respiratory: Negative for cough, sputum production and shortness of breath.   Cardiovascular: Negative for chest pain.  Gastrointestinal: Positive for nausea  and abdominal pain. Negative for vomiting.  Genitourinary: Negative for dysuria, urgency, frequency and flank pain.  Musculoskeletal: Negative for myalgias, back pain, joint pain, falls and neck pain.  Neurological: Negative for dizziness, tingling, sensory change, focal weakness, loss of consciousness and headaches.  Endo/Heme/Allergies: Does not bruise/bleed easily.  Psychiatric/Behavioral: Negative for depression, memory loss and substance abuse. The patient is not nervous/anxious.     Blood pressure 110/77, pulse 122, temperature 99.2 F (37.3 C), temperature source Oral, resp. rate 16, weight 74.481 kg (164 lb 3.2 oz), last menstrual period 09/03/2015, SpO2 100 %, not currently breastfeeding. Physical Exam  WDWN in NAD HEENT:  EOMI, sclera anicteric Neck:  No masses, no thyromegaly Lungs:  CTA bilaterally; normal respiratory effort CV:  Regular rate and rhythm; no murmurs Abd:  +bowel sounds, soft, tender in lower abdomen, but greatest in the RLQ Ext:  Well-perfused; no edema Skin:  Warm, dry; no sign of jaundice  Assessment/Plan Early acute appendicitis that seems to be involving the tip of the appendix - no sign of perforation or abscess  Admit for IV antibiotics and IV hydration Plan laparoscopic appendectomy tomorrow morning.  Omarian Jaquith K. 09/25/2015, 11:17 PM

## 2015-09-26 ENCOUNTER — Encounter (HOSPITAL_COMMUNITY)
Admission: EM | Disposition: A | Discharge status: Home / Self Care | Payer: Self-pay | Source: Home / Self Care | Attending: Emergency Medicine

## 2015-09-26 ENCOUNTER — Observation Stay (HOSPITAL_COMMUNITY): Payer: Medicaid Other | Admitting: Certified Registered"

## 2015-09-26 ENCOUNTER — Encounter (HOSPITAL_COMMUNITY): Payer: Self-pay | Admitting: Certified Registered"

## 2015-09-26 DIAGNOSIS — K353 Acute appendicitis with localized peritonitis: Secondary | ICD-10-CM | POA: Diagnosis not present

## 2015-09-26 HISTORY — PX: APPENDECTOMY: SHX54

## 2015-09-26 HISTORY — PX: LAPAROSCOPIC APPENDECTOMY: SHX408

## 2015-09-26 LAB — SURGICAL PCR SCREEN
MRSA, PCR: NEGATIVE
Staphylococcus aureus: NEGATIVE

## 2015-09-26 LAB — GC/CHLAMYDIA PROBE AMP (~~LOC~~) NOT AT ARMC
CHLAMYDIA, DNA PROBE: NEGATIVE
NEISSERIA GONORRHEA: NEGATIVE

## 2015-09-26 SURGERY — APPENDECTOMY, LAPAROSCOPIC
Anesthesia: General | Site: Abdomen

## 2015-09-26 MED ORDER — PIPERACILLIN-TAZOBACTAM 3.375 G IVPB
3.3750 g | Freq: Three times a day (TID) | INTRAVENOUS | Status: DC
  Administered 2015-09-26: 3.375 g via INTRAVENOUS
  Filled 2015-09-26 (×3): qty 50

## 2015-09-26 MED ORDER — HYDROMORPHONE HCL 1 MG/ML IJ SOLN
1.0000 mg | INTRAMUSCULAR | Status: DC | PRN
  Administered 2015-09-26: 1 mg via INTRAVENOUS
  Filled 2015-09-26: qty 1

## 2015-09-26 MED ORDER — PHENYLEPHRINE 40 MCG/ML (10ML) SYRINGE FOR IV PUSH (FOR BLOOD PRESSURE SUPPORT)
PREFILLED_SYRINGE | INTRAVENOUS | Status: AC
  Filled 2015-09-26: qty 10

## 2015-09-26 MED ORDER — ROCURONIUM BROMIDE 100 MG/10ML IV SOLN
INTRAVENOUS | Status: DC | PRN
  Administered 2015-09-26: 20 mg via INTRAVENOUS

## 2015-09-26 MED ORDER — SUCCINYLCHOLINE CHLORIDE 20 MG/ML IJ SOLN
INTRAMUSCULAR | Status: DC | PRN
  Administered 2015-09-26: 100 mg via INTRAVENOUS

## 2015-09-26 MED ORDER — INFLUENZA VAC SPLIT QUAD 0.5 ML IM SUSY
0.5000 mL | PREFILLED_SYRINGE | INTRAMUSCULAR | Status: DC
  Filled 2015-09-26: qty 0.5

## 2015-09-26 MED ORDER — DEXAMETHASONE SODIUM PHOSPHATE 4 MG/ML IJ SOLN
INTRAMUSCULAR | Status: AC
  Filled 2015-09-26: qty 2

## 2015-09-26 MED ORDER — 0.9 % SODIUM CHLORIDE (POUR BTL) OPTIME
TOPICAL | Status: DC | PRN
  Administered 2015-09-26: 1000 mL

## 2015-09-26 MED ORDER — DIPHENHYDRAMINE HCL 25 MG PO CAPS: 25.0000 mg | ORAL_CAPSULE | Freq: Four times a day (QID) | ORAL | Status: DC | PRN

## 2015-09-26 MED ORDER — OXYCODONE HCL 5 MG/5ML PO SOLN: 5.0000 mg | Freq: Once | ORAL | Status: DC | PRN

## 2015-09-26 MED ORDER — PROPOFOL 10 MG/ML IV BOLUS
INTRAVENOUS | Status: AC
  Filled 2015-09-26: qty 20

## 2015-09-26 MED ORDER — NEOSTIGMINE METHYLSULFATE 10 MG/10ML IV SOLN
INTRAVENOUS | Status: DC | PRN
  Administered 2015-09-26: 3 mg via INTRAVENOUS

## 2015-09-26 MED ORDER — MIDAZOLAM HCL 2 MG/2ML IJ SOLN
INTRAMUSCULAR | Status: AC
  Filled 2015-09-26: qty 4

## 2015-09-26 MED ORDER — SODIUM CHLORIDE 0.9 % IR SOLN
Status: DC | PRN
  Administered 2015-09-26: 1000 mL

## 2015-09-26 MED ORDER — ONDANSETRON 4 MG PO TBDP
4.0000 mg | ORAL_TABLET | Freq: Four times a day (QID) | ORAL | Status: DC | PRN
  Filled 2015-09-26: qty 1

## 2015-09-26 MED ORDER — ARTIFICIAL TEARS OP OINT
TOPICAL_OINTMENT | OPHTHALMIC | Status: AC
  Filled 2015-09-26: qty 3.5

## 2015-09-26 MED ORDER — LACTATED RINGERS IV SOLN
INTRAVENOUS | Status: DC
  Administered 2015-09-26 (×2): via INTRAVENOUS

## 2015-09-26 MED ORDER — PIPERACILLIN-TAZOBACTAM 3.375 G IVPB
3.3750 g | Freq: Three times a day (TID) | INTRAVENOUS | Status: AC
  Administered 2015-09-26 (×2): 3.375 g via INTRAVENOUS
  Filled 2015-09-26 (×2): qty 50

## 2015-09-26 MED ORDER — BUPIVACAINE-EPINEPHRINE (PF) 0.25% -1:200000 IJ SOLN
INTRAMUSCULAR | Status: AC
  Filled 2015-09-26: qty 30

## 2015-09-26 MED ORDER — MIDAZOLAM HCL 5 MG/5ML IJ SOLN
INTRAMUSCULAR | Status: DC | PRN
  Administered 2015-09-26: 2 mg via INTRAVENOUS

## 2015-09-26 MED ORDER — DIPHENHYDRAMINE HCL 50 MG/ML IJ SOLN: 25.0000 mg | Freq: Four times a day (QID) | INTRAMUSCULAR | Status: DC | PRN

## 2015-09-26 MED ORDER — NEOSTIGMINE METHYLSULFATE 10 MG/10ML IV SOLN
INTRAVENOUS | Status: AC
  Filled 2015-09-26: qty 1

## 2015-09-26 MED ORDER — POTASSIUM CHLORIDE IN NACL 20-0.9 MEQ/L-% IV SOLN
INTRAVENOUS | Status: DC
  Administered 2015-09-26: 01:00:00 via INTRAVENOUS
  Filled 2015-09-26: qty 1000

## 2015-09-26 MED ORDER — ONDANSETRON HCL 4 MG/2ML IJ SOLN: 4.0000 mg | Freq: Four times a day (QID) | INTRAMUSCULAR | Status: DC | PRN

## 2015-09-26 MED ORDER — HYDROMORPHONE HCL 1 MG/ML IJ SOLN
INTRAMUSCULAR | Status: AC
  Filled 2015-09-26: qty 1

## 2015-09-26 MED ORDER — ONDANSETRON HCL 4 MG/2ML IJ SOLN
INTRAMUSCULAR | Status: DC | PRN
  Administered 2015-09-26: 4 mg via INTRAVENOUS

## 2015-09-26 MED ORDER — ONDANSETRON HCL 4 MG/2ML IJ SOLN
INTRAMUSCULAR | Status: AC
  Filled 2015-09-26: qty 2

## 2015-09-26 MED ORDER — ROCURONIUM BROMIDE 50 MG/5ML IV SOLN
INTRAVENOUS | Status: AC
  Filled 2015-09-26: qty 1

## 2015-09-26 MED ORDER — LIDOCAINE HCL (CARDIAC) 20 MG/ML IV SOLN
INTRAVENOUS | Status: DC | PRN
  Administered 2015-09-26: 80 mg via INTRAVENOUS

## 2015-09-26 MED ORDER — SUCCINYLCHOLINE CHLORIDE 20 MG/ML IJ SOLN
INTRAMUSCULAR | Status: AC
  Filled 2015-09-26: qty 1

## 2015-09-26 MED ORDER — PROPOFOL 10 MG/ML IV BOLUS
INTRAVENOUS | Status: DC | PRN
  Administered 2015-09-26: 170 mg via INTRAVENOUS

## 2015-09-26 MED ORDER — BUPIVACAINE-EPINEPHRINE 0.25% -1:200000 IJ SOLN
INTRAMUSCULAR | Status: DC | PRN
  Administered 2015-09-26: 10 mL

## 2015-09-26 MED ORDER — GLYCOPYRROLATE 0.2 MG/ML IJ SOLN
INTRAMUSCULAR | Status: DC | PRN
  Administered 2015-09-26: 0.4 mg via INTRAVENOUS

## 2015-09-26 MED ORDER — EPHEDRINE SULFATE 50 MG/ML IJ SOLN
INTRAMUSCULAR | Status: AC
  Filled 2015-09-26: qty 1

## 2015-09-26 MED ORDER — HYDROMORPHONE HCL 1 MG/ML IJ SOLN
0.2500 mg | INTRAMUSCULAR | Status: DC | PRN
  Administered 2015-09-26 (×2): 0.25 mg via INTRAVENOUS

## 2015-09-26 MED ORDER — SODIUM CHLORIDE 0.9 % IJ SOLN
INTRAMUSCULAR | Status: AC
  Filled 2015-09-26: qty 10

## 2015-09-26 MED ORDER — FENTANYL CITRATE (PF) 250 MCG/5ML IJ SOLN
INTRAMUSCULAR | Status: AC
Start: 2015-09-26 — End: 2015-09-26
  Filled 2015-09-26: qty 5

## 2015-09-26 MED ORDER — GLYCOPYRROLATE 0.2 MG/ML IJ SOLN
INTRAMUSCULAR | Status: AC
  Filled 2015-09-26: qty 3

## 2015-09-26 MED ORDER — LIDOCAINE HCL (CARDIAC) 20 MG/ML IV SOLN
INTRAVENOUS | Status: AC
  Filled 2015-09-26: qty 5

## 2015-09-26 MED ORDER — FENTANYL CITRATE (PF) 100 MCG/2ML IJ SOLN
INTRAMUSCULAR | Status: DC | PRN
  Administered 2015-09-26: 50 ug via INTRAVENOUS
  Administered 2015-09-26: 100 ug via INTRAVENOUS
  Administered 2015-09-26: 50 ug via INTRAVENOUS

## 2015-09-26 MED ORDER — OXYCODONE HCL 5 MG PO TABS: 5.0000 mg | ORAL_TABLET | Freq: Once | ORAL | Status: DC | PRN

## 2015-09-26 MED ORDER — PANTOPRAZOLE SODIUM 40 MG IV SOLR
40.0000 mg | Freq: Every day | INTRAVENOUS | Status: DC
  Administered 2015-09-26: 40 mg via INTRAVENOUS
  Filled 2015-09-26: qty 40

## 2015-09-26 MED ORDER — OXYCODONE-ACETAMINOPHEN 5-325 MG PO TABS
1.0000 | ORAL_TABLET | ORAL | Status: DC | PRN
Start: 2015-09-26 — End: 2015-09-27
  Administered 2015-09-26 (×3): 2 via ORAL
  Filled 2015-09-26 (×4): qty 2

## 2015-09-26 MED ORDER — FENTANYL CITRATE (PF) 250 MCG/5ML IJ SOLN
INTRAMUSCULAR | Status: AC
  Filled 2015-09-26: qty 5

## 2015-09-26 SURGICAL SUPPLY — 39 items
APPLIER CLIP ROT 10 11.4 M/L (STAPLE)
CANISTER SUCTION 2500CC (MISCELLANEOUS) ×2 IMPLANT
CHLORAPREP W/TINT 26ML (MISCELLANEOUS) ×2 IMPLANT
CLIP APPLIE ROT 10 11.4 M/L (STAPLE) IMPLANT
COVER SURGICAL LIGHT HANDLE (MISCELLANEOUS) ×2 IMPLANT
CUTTER FLEX LINEAR 45M (STAPLE) ×2 IMPLANT
DEVICE TROCAR PUNCTURE CLOSURE (ENDOMECHANICALS) ×2 IMPLANT
ELECT REM PT RETURN 9FT ADLT (ELECTROSURGICAL) ×2
ELECTRODE REM PT RTRN 9FT ADLT (ELECTROSURGICAL) ×1 IMPLANT
GLOVE BIO SURGEON STRL SZ7 (GLOVE) ×2 IMPLANT
GLOVE BIOGEL PI IND STRL 7.5 (GLOVE) ×3 IMPLANT
GLOVE BIOGEL PI INDICATOR 7.5 (GLOVE) ×3
GLOVE ECLIPSE 7.5 STRL STRAW (GLOVE) ×2 IMPLANT
GOWN STRL REUS W/ TWL LRG LVL3 (GOWN DISPOSABLE) ×3 IMPLANT
GOWN STRL REUS W/TWL LRG LVL3 (GOWN DISPOSABLE) ×3
KIT BASIN OR (CUSTOM PROCEDURE TRAY) ×2 IMPLANT
KIT ROOM TURNOVER OR (KITS) ×2 IMPLANT
LIQUID BAND (GAUZE/BANDAGES/DRESSINGS) ×2 IMPLANT
NS IRRIG 1000ML POUR BTL (IV SOLUTION) ×2 IMPLANT
PAD ARMBOARD 7.5X6 YLW CONV (MISCELLANEOUS) ×4 IMPLANT
POUCH RETRIEVAL ECOSAC 10 (ENDOMECHANICALS) ×1 IMPLANT
POUCH RETRIEVAL ECOSAC 10MM (ENDOMECHANICALS) ×1
RELOAD 45 VASCULAR/THIN (ENDOMECHANICALS) IMPLANT
RELOAD STAPLE TA45 3.5 REG BLU (ENDOMECHANICALS) ×2 IMPLANT
SCALPEL HARMONIC ACE (MISCELLANEOUS) ×2 IMPLANT
SCISSORS LAP 5X35 DISP (ENDOMECHANICALS) IMPLANT
SET IRRIG TUBING LAPAROSCOPIC (IRRIGATION / IRRIGATOR) ×2 IMPLANT
SLEEVE ENDOPATH XCEL 5M (ENDOMECHANICALS) ×2 IMPLANT
SPECIMEN JAR SMALL (MISCELLANEOUS) ×2 IMPLANT
STRIP CLOSURE SKIN 1/2X4 (GAUZE/BANDAGES/DRESSINGS) ×2 IMPLANT
SUT MNCRL AB 4-0 PS2 18 (SUTURE) ×2 IMPLANT
SUT VICRYL 0 UR6 27IN ABS (SUTURE) ×2 IMPLANT
TOWEL OR 17X24 6PK STRL BLUE (TOWEL DISPOSABLE) ×2 IMPLANT
TOWEL OR 17X26 10 PK STRL BLUE (TOWEL DISPOSABLE) ×2 IMPLANT
TRAY FOLEY CATH 16FR SILVER (SET/KITS/TRAYS/PACK) ×2 IMPLANT
TRAY LAPAROSCOPIC MC (CUSTOM PROCEDURE TRAY) ×2 IMPLANT
TROCAR XCEL BLUNT TIP 100MML (ENDOMECHANICALS) ×2 IMPLANT
TROCAR XCEL NON-BLD 5MMX100MML (ENDOMECHANICALS) ×2 IMPLANT
TUBING INSUFFLATION (TUBING) ×2 IMPLANT

## 2015-09-26 NOTE — Progress Notes (Signed)
Called report to Short stay  

## 2015-09-26 NOTE — Op Note (Signed)
Preoperative diagnosis: acute appendicitis Postoperative diagnosis: same as above Procedure: laparoscopic appendectomy Surgeon: Dr Harden Mo Anesthesia: general EBL: minimal Drains none Specimen appendix to pathology Complications: none Sponge count correct at completion Disposition to recovery stable  Indications: This is a 42 yof with rlq pain, elevated wbc and ct scan consistent with appendicitis. We discussed laparoscopic appendectomy.   Procedure: After informed consent was obtained the patient was taken to the operating room. She was already given antibiotics. Sequential compression devices were on her legs.She was placed under general anesthesia without complication. Her abdomen was prepped and draped in the standard sterile surgical fashion. A surgical timeout was then performed. A foley catheter was placed.   I infiltrated marcaine below the umbilicus. I made an incision and then entered the fascia sharply. I then entered the peritoneum bluntly. I placed a 0 vicryl pursestring suture and inserted a hasson trocar.I then inserted 2 further 5 mm trocars in the suprapubic region and the mid abdomen.  She did appear to have acute appendicitis. The appendix was thickened, inflamed at the distal third I was concerned I was not identifying it correctly and rotated the entire cecum medially. I saw the TI into the right colon. I then dissected it free from the cecum. I divided the mesoappendix with the harmonic scalpel. I then divided the appendix with the gia stapler. I then placed this in a bag and removed it from the abdomen.  I then obtained hemostasis and irrigated. I then removed the umbilical trocar and closed with 0 vicryl and the endoclose device after tying down the pursestring.  I then desufflated the abdomen and removed all my remaining trocars. I then closed these with 4-0 Monocryl and Dermabond. He tolerated this well was extubated and transferred to the recovery room in  stable condition

## 2015-09-26 NOTE — Anesthesia Procedure Notes (Signed)
Procedure Name: Intubation Date/Time: 09/26/2015 9:03 AM Performed by: Arlice Colt B Pre-anesthesia Checklist: Timeout performed, Patient identified, Emergency Drugs available, Suction available and Patient being monitored Patient Re-evaluated:Patient Re-evaluated prior to inductionOxygen Delivery Method: Circle system utilized Preoxygenation: Pre-oxygenation with 100% oxygen Intubation Type: IV induction and Rapid sequence Laryngoscope Size: Mac and 4 Grade View: Grade I Tube type: Oral Tube size: 7.5 mm Number of attempts: 1 Airway Equipment and Method: Stylet Placement Confirmation: ETT inserted through vocal cords under direct vision,  breath sounds checked- equal and bilateral,  positive ETCO2 and CO2 detector Secured at: 22 cm Tube secured with: Tape Dental Injury: Teeth and Oropharynx as per pre-operative assessment

## 2015-09-26 NOTE — Anesthesia Postprocedure Evaluation (Signed)
Anesthesia Post Note  Patient: Amy Lloyd  Procedure(s) Performed: Procedure(s) (LRB): APPENDECTOMY LAPAROSCOPIC (N/A)  Anesthesia type: General  Patient location: PACU  Post pain: Pain level controlled and Adequate analgesia  Post assessment: Post-op Vital signs reviewed, Patient's Cardiovascular Status Stable, Respiratory Function Stable, Patent Airway and Pain level controlled  Last Vitals:  Filed Vitals:   09/26/15 1030  BP:   Pulse: 85  Temp:   Resp: 25    Post vital signs: Reviewed and stable  Level of consciousness: awake, alert  and oriented  Complications: No apparent anesthesia complications

## 2015-09-26 NOTE — Transfer of Care (Signed)
Immediate Anesthesia Transfer of Care Note  Patient: Amy Lloyd  Procedure(s) Performed: Procedure(s): APPENDECTOMY LAPAROSCOPIC (N/A)  Patient Location: PACU  Anesthesia Type:General  Level of Consciousness: awake, alert , oriented and patient cooperative  Airway & Oxygen Therapy: Patient Spontanous Breathing and Patient connected to face mask oxygen  Post-op Assessment: Report given to RN, Post -op Vital signs reviewed and stable and Patient moving all extremities  Post vital signs: Reviewed and stable  Last Vitals:  Filed Vitals:   09/26/15 0500  BP: 117/74  Pulse: 117  Temp: 37.6 C  Resp: 15    Complications: No apparent anesthesia complications

## 2015-09-26 NOTE — Progress Notes (Signed)
Patient ID: Amy Lloyd, female   DOB: 05/01/1986, 29 y.o.   MRN: [id number]     [facility]      [address] [place], [place] [address]    Phone: [phone] FAX: [phone]     Subjective: C/o pain rlq, no increase.  No n/v.  NPO.    [place] interpreter Art [phone]  Objective:  Vital signs:  Filed Vitals:   09/25/15 2300 09/25/15 2315 09/26/15 0002 09/26/15 0500  BP: 111/65 124/74 124/79 117/74  Pulse: 117 116 110 117  Temp:   98.8 F (37.1 C) 99.7 F (37.6 C)  TempSrc:   Oral Oral  Resp:   17 15  Weight:      SpO2: 100% 100% 100% 100%    Last BM Date: 09/25/15  Intake/Output   Yesterday:    This shift:     Physical Exam: General: Pt awake/alert/oriented x4 in no acute distress Chest: cta.  No chest wall pain w good excursion CV:  Pulses intact.  Regular rhythm Abdomen: Soft.  Nondistended. ttp pelvic region, most appreciated RLQ.   No evidence of peritonitis.  No incarcerated hernias. Ext:  SCDs BLE.  No mjr edema.  No cyanosis Skin: No petechiae / purpura   Problem List:   Active Problems:   Appendicitis, acute, with peritonitis    Results:   Labs: Results for orders placed or performed during the hospital encounter of 09/25/15 (from the past 48 hour(s))  Lipase, blood     Status: None   Collection Time: 09/25/15  6:00 PM  Result Value Ref Range   Lipase 25 22 - 51 U/L  Comprehensive metabolic panel     Status: Abnormal   Collection Time: 09/25/15  6:00 PM  Result Value Ref Range   Sodium 135 135 - 145 mmol/L   Potassium 3.4 (L) 3.5 - 5.1 mmol/L   Chloride 100 (L) 101 - 111 mmol/L   CO2 27 22 - 32 mmol/L   Glucose, Bld 101 (H) 65 - 99 mg/dL   BUN 12 6 - 20 mg/dL   Creatinine, Ser 0.81 0.44 - 1.00 mg/dL   Calcium 8.9 8.9 - 10.3 mg/dL   Total Protein 7.3 6.5 - 8.1 g/dL   Albumin 3.6 3.5 - 5.0 g/dL   AST 31 15 - 41 U/L   ALT 22 14 - 54 U/L   Alkaline Phosphatase 87 38 - 126 U/L    Total Bilirubin 0.7 0.3 - 1.2 mg/dL   GFR calc non Af Amer >60 >60 mL/min   GFR calc Af Amer >60 >60 mL/min    Comment: (NOTE) The eGFR has been calculated using the CKD EPI equation. This calculation has not been validated in all clinical situations. eGFR's persistently <60 mL/min signify possible Chronic Kidney Disease.    Anion gap 8 5 - 15  CBC     Status: None   Collection Time: 09/25/15  6:00 PM  Result Value Ref Range   WBC 9.5 4.0 - 10.5 K/uL   RBC 4.71 3.87 - 5.11 MIL/uL   Hemoglobin 14.0 12.0 - 15.0 g/dL   HCT 40.9 36.0 - 46.0 %   MCV 86.8 78.0 - 100.0 fL   MCH 29.7 26.0 - 34.0 pg   MCHC 34.2 30.0 - 36.0 g/dL   RDW 12.1 11.5 - 15.5 %   Platelets 180 150 - 400 K/uL  Urinalysis, Routine w reflex microscopic (not at [facility])     Status: Abnormal  Collection Time: 09/25/15  8:42 PM  Result Value Ref Range   Color, Urine YELLOW YELLOW   APPearance CLOUDY (A) CLEAR   Specific Gravity, Urine 1.020 1.005 - 1.030   pH 7.5 5.0 - 8.0   Glucose, UA NEGATIVE NEGATIVE mg/dL   Hgb urine dipstick MODERATE (A) NEGATIVE   Bilirubin Urine NEGATIVE NEGATIVE   Ketones, ur NEGATIVE NEGATIVE mg/dL   Protein, ur NEGATIVE NEGATIVE mg/dL   Urobilinogen, UA 1.0 0.0 - 1.0 mg/dL   Nitrite NEGATIVE NEGATIVE   Leukocytes, UA TRACE (A) NEGATIVE  Urine microscopic-add on     Status: Abnormal   Collection Time: 09/25/15  8:42 PM  Result Value Ref Range   Squamous Epithelial / LPF FEW (A) RARE   WBC, UA 7-10 <3 WBC/hpf   RBC / HPF 3-6 <3 RBC/hpf   Bacteria, UA MANY (A) RARE  POC Urine Pregnancy, ED (do NOT order at [facility])     Status: None   Collection Time: 09/25/15  8:58 PM  Result Value Ref Range   Preg Test, Ur NEGATIVE NEGATIVE    Comment:        THE SENSITIVITY OF THIS METHODOLOGY IS >24 mIU/mL     Imaging / Studies: Ct Abdomen Pelvis W Contrast  09/25/2015   CLINICAL DATA:  29 year old female with periumbilical pain and nausea since 0300 hr today. Initial encounter.  EXAM: CT  ABDOMEN AND PELVIS WITH CONTRAST  TECHNIQUE: Multidetector CT imaging of the abdomen and pelvis was performed using the standard protocol following bolus administration of intravenous contrast.  CONTRAST:  144m OMNIPAQUE IOHEXOL 300 MG/ML  SOLN  COMPARISON:  Abdomen ultrasound 05/21/2011.  FINDINGS: Mild respiratory motion and dependent atelectasis at the lung bases. No pericardial or pleural effusion.  No acute osseous abnormality identified. L4-L5 central disc protrusion.  Small volume pelvic free fluid in the cul-de-sac. IUD. Physiologic bilateral adnexal cysts suspected, otherwise negative uterus and adnexa. Mostly decompressed distal colon. Diminutive urinary bladder. Mostly decompressed sigmoid colon.  Negative left colon, transverse colon, hepatic flexure and right colon. The cecum is not inflamed. Negative terminal ileum.  The appendix does not contain gas. The base of the appendix appears largely unremarkable (coronal image 36), however, there is progressive enlargement and irregular appearance of the appendix distally towards the tip. There is soft tissue thickening and stranding about the tip best seen on coronal image 41. The appendix in this region measures up to 8 mm diameter. It appears to contain fluid throughout its course. The soft tissue thickening in this region is somewhat inseparable from the right adnexa and some decompressed small bowel loops in the region. No extraluminal gas. No free fluid.  No dilated small bowel.  Decompressed stomach and duodenum.  No pneumoperitoneum. Liver, gallbladder, spleen, pancreas, adrenal glands, and major arterial structures in the abdomen and pelvis are within normal limits. Portal venous system appears to remain patent. Punctate bilateral nephrolithiasis. No perinephric stranding. The right ureter is also in proximity to the abnormal appendix. No hydroureter. No lymphadenopathy.  IMPRESSION: 1. Acute appendicitis relatively sparing the base of the appendix  at this time, most pronounced at the tip. Soft tissue thickening and stranding surrounding the tip of the appendix is also inseparable from the nearby right ureter, right adnexa, and regional decompressed small bowel loops. See coronal image 41. 2. No free air or evidence of perforation at this time. Trace amount of pelvic free fluid could be reactive or physiologic in this female patient.   Electronically Signed  By: Genevie Ann M.D.   On: 09/25/2015 22:26    Medications / Allergies:  Scheduled Meds: . pantoprazole (PROTONIX) IV  40 mg Intravenous QHS  . piperacillin-tazobactam (ZOSYN)  IV  3.375 g Intravenous 3 times per day   Continuous Infusions: . 0.9 % NaCl with KCl 20 mEq / L 100 mL/hr at 09/26/15 0054   PRN Meds:.diphenhydrAMINE **OR** diphenhydrAMINE, HYDROmorphone (DILAUDID) injection, ondansetron **OR** ondansetron (ZOFRAN) IV  Antibiotics: Anti-infectives    Start     Dose/Rate Route Frequency Ordered Stop   09/26/15 0600  piperacillin-tazobactam (ZOSYN) IVPB 3.375 g     3.375 g 12.5 mL/hr over 240 Minutes Intravenous 3 times per day 09/26/15 0002     09/25/15 2245  piperacillin-tazobactam (ZOSYN) IVPB 3.375 g     3.375 g 100 mL/hr over 30 Minutes Intravenous  Once 09/25/15 2233 09/25/15 2326        Assessment/Plan Acute appendicitis -to OR this AM for a laparoscopic appendectomy.  ID-zosyn FEN-NPO, IVF, add PO pain meds Dispo-to OR.  Wants to go home today  Erby Pian, [facility] Pager [phone]) For consults and floor pages call [phone])  09/26/2015 7:40 AM

## 2015-09-26 NOTE — Anesthesia Preprocedure Evaluation (Signed)
Anesthesia Evaluation  Patient identified by MRN, date of birth, ID band Patient awake    Reviewed: Allergy & Precautions, NPO status , Patient's Chart, lab work & pertinent test results  Airway Mallampati: I   Neck ROM: full    Dental   Pulmonary neg pulmonary ROS,  breath sounds clear to auscultation        Cardiovascular negative cardio ROS  Rhythm:regular Rate:Normal     Neuro/Psych    GI/Hepatic   Endo/Other    Renal/GU      Musculoskeletal   Abdominal   Peds  Hematology   Anesthesia Other Findings   Reproductive/Obstetrics                             Anesthesia Physical Anesthesia Plan  ASA: I and emergent  Anesthesia Plan: General   Post-op Pain Management:    Induction: Intravenous  Airway Management Planned: Oral ETT  Additional Equipment:   Intra-op Plan:   Post-operative Plan: Extubation in OR  Informed Consent: I have reviewed the patients History and Physical, chart, labs and discussed the procedure including the risks, benefits and alternatives for the proposed anesthesia with the patient or authorized representative who has indicated his/her understanding and acceptance.     Plan Discussed with: CRNA, Anesthesiologist and Surgeon  Anesthesia Plan Comments:         Anesthesia Quick Evaluation  

## 2015-09-27 ENCOUNTER — Encounter (HOSPITAL_COMMUNITY): Payer: Self-pay | Admitting: General Surgery

## 2015-09-27 DIAGNOSIS — R109 Unspecified abdominal pain: Secondary | ICD-10-CM | POA: Diagnosis present

## 2015-09-27 DIAGNOSIS — K353 Acute appendicitis with localized peritonitis: Secondary | ICD-10-CM | POA: Diagnosis not present

## 2015-09-27 MED ORDER — OXYCODONE-ACETAMINOPHEN 5-325 MG PO TABS: 1.0000 | ORAL_TABLET | ORAL | Status: DC | PRN

## 2015-09-27 NOTE — Discharge Instructions (Signed)
LAPAROSCOPIC SURGERY: POST OP INSTRUCTIONS ° °1. DIET: Follow a light bland diet the first 24 hours after arrival home, such as soup, liquids, crackers, etc.  Be sure to include lots of fluids daily.  Avoid fast food or heavy meals as your are more likely to get nauseated.  Eat a low fat the next few days after surgery.   °2. Take your usually prescribed home medications unless otherwise directed. °3. PAIN CONTROL: °a. Pain is best controlled by a usual combination of three different methods TOGETHER: °i. Ice/Heat °ii. Over the counter pain medication °iii. Prescription pain medication °b. Most patients will experience some swelling and bruising around the incisions.  Ice packs or heating pads (30-60 minutes up to 6 times a day) will help. Use ice for the first few days to help decrease swelling and bruising, then switch to heat to help relax tight/sore spots and speed recovery.  Some people prefer to use ice alone, heat alone, alternating between ice & heat.  Experiment to what works for you.  Swelling and bruising can take several weeks to resolve.   °c. It is helpful to take an over-the-counter pain medication regularly for the first few weeks.  Choose one of the following that works best for you: °i. Naproxen (Aleve, etc)  Two 220mg tabs twice a day °ii. Ibuprofen (Advil, etc) Three 200mg tabs four times a day (every meal & bedtime) °iii. Acetaminophen (Tylenol, etc) 500-650mg four times a day (every meal & bedtime) °d. A  prescription for pain medication (such as oxycodone, hydrocodone, etc) should be given to you upon discharge.  Take your pain medication as prescribed.  °i. If you are having problems/concerns with the prescription medicine (does not control pain, nausea, vomiting, rash, itching, etc), please call us [phone] to see if we need to switch you to a different pain medicine that will work better for you and/or control your side effect better. °ii. If you need a refill on your pain medication,  please contact your pharmacy.  They will contact our office to request authorization. Prescriptions will not be filled after 5 pm or on week-ends. °4. Avoid getting constipated.  Between the surgery and the pain medications, it is common to experience some constipation.  Increasing fluid intake and taking a fiber supplement (such as Metamucil, Citrucel, FiberCon, MiraLax, etc) 1-2 times a day regularly will usually help prevent this problem from occurring.  A mild laxative (prune juice, Milk of Magnesia, MiraLax, etc) should be taken according to package directions if there are no bowel movements after 48 hours.   °5. Watch out for diarrhea.  If you have many loose bowel movements, simplify your diet to bland foods & liquids for a few days.  Stop any stool softeners and decrease your fiber supplement.  Switching to mild anti-diarrheal medications (Kayopectate, Pepto Bismol) can help.  If this worsens or does not improve, please call us. °6. Wash / shower every day.  You may shower over the dressings as they are waterproof.  Continue to shower over incision(s) after the dressing is off. °7. Remove your waterproof bandages 5 days after surgery.  You may leave the incision open to air.  You may replace a dressing/Band-Aid to cover the incision for comfort if you wish.  °8. ACTIVITIES as tolerated:   °a. You may resume regular (light) daily activities beginning the next day--such as daily self-care, walking, climbing stairs--gradually increasing activities as tolerated.  If you can walk 30 minutes without difficulty, it   is safe to try more intense activity such as jogging, treadmill, bicycling, low-impact aerobics, swimming, etc. b. Save the most intensive and strenuous activity for last such as sit-ups, heavy lifting, contact sports, etc  Refrain from any heavy lifting or straining until you are off narcotics for pain control.   c. DO NOT PUSH THROUGH PAIN.  Let pain be your guide: If it hurts to do something, don't  do it.  Pain is your body warning you to avoid that activity for another week until the pain goes down. d. You may drive when you are no longer taking prescription pain medication, you can comfortably wear a seatbelt, and you can safely maneuver your car and apply brakes. e. Bonita Quin may have sexual intercourse when it is comfortable.  9. FOLLOW UP in our office a. Please call [facility] at [phone] to set up an appointment to see your surgeon in the office for a follow-up appointment approximately 2-3 weeks after your surgery. b. Make sure that you call for this appointment the day you arrive home to insure a convenient appointment time. 10. IF YOU HAVE DISABILITY OR FAMILY LEAVE FORMS, BRING THEM TO THE OFFICE FOR PROCESSING.  DO NOT GIVE THEM TO YOUR DOCTOR.   WHEN TO CALL us [phone]: 1. Poor pain control 2. Reactions / problems with new medications (rash/itching, nausea, etc)  3. Fever over 101.5 F (38.5 C) 4. Inability to urinate 5. Nausea and/or vomiting 6. Worsening swelling or bruising 7. Continued bleeding from incision. 8. Increased pain, redness, or drainage from the incision   The clinic staff is available to answer your questions during regular business hours (8:30am-5pm).  Please dont hesitate to call and ask to speak to one of our nurses for clinical concerns.   If you have a medical emergency, go to the nearest emergency room or call 911.  A surgeon from [facility] is always on call at the [facility], [place] [address], [place], [place]  [address] ? MAIN: [phone] ? TOLL FREE: [phone] ?  FAX [phone] [email or web address]  Apendectoma Laparoscpica - Adultos (Laparoscopic Appendectomy, Adult)  Una apendicectoma es un procedimiento que se realiza para extirpar el apndice. La ciruga laparoscpica emplea varios cortes pequeos (incisiones) en lugar de una incisin grande. La ciruga  laparoscpica brinda un tiempo ms breve de recuperacin y [employer].  INFORME A SU MDICO:   Alergias a alimentos o medicamentos.  Medicamentos que [place], incluyendo vitaminas, hierbas, gotas oftlmicas, medicamentos de venta libre y cremas.  Uso de corticoides (por va oral o cremas).  Problemas anteriores debido a anestsicos o a medicamentos que [employer] sensibilidad.  Antecedentes de hemorragias o cogulos sanguneos  Cirugas anteriores.  Otros problemas de salud, incluyendo diabetes, problemas cardacos, pulmonares y renales.  Posibilidad de embarazo, si corresponde. RIESGOS Y COMPLICACIONES.   Infecciones. Un germen comienza a desarrollarse en la herida. Generalmente se trata con antibiticos. En algunos casos, la herida debe abrirse y limpiarse.  Hemorragias  Lesiones en los rganos circundantes.  lceras (abscesos).  Dolor crnico en el sitio de la incisin. Se define como un dolor que dura ms de 3 meses.  Cogulos de [employer] piernas que en algunos casos [employer].  Infecciones en el pulmn (neumona). ANTES DEL PROCEDIMIENTO  La apendicectoma se realiza inmediatamente despus de diagnosticar la inflamacin del apndice (apendicitis). No es necesario una preparacin antes del procedimiento.  PROCEDIMIENTO   Conley Rolls  administrarn un medicamento que lo har dormir (anestesia general). Una vez que se encuentre dormido, Musician un tubo flexible (catter) en la vejiga para drenar la orina durante la ciruga. El tubo se retira antes de que usted se despierte de la anestesia. Cuando se encuentre dormido, le insuflarn dixido de carbono en el abdomen. Esto le permitir al cirujano observar el interior del abdomen y llevar a cabo la [place]. Le practicarn varias incisiones pequeas en el abdomen. El cirujano insertar un tubo delgado y luminoso (laparoscopio) a travs de una de las incisiones. El cirujano observar por el laparoscopio mientras  realiza la [place]. Se insertarn otros instrumentos quirrgicos a travs de las otras incisiones. La laparoscopia puede no ser apropiada cuando:   Hay una cicatriz importante de una operacin anterior.  El paciente tiene trastornos hemorrgicos.  Si tiene Engineer, petroleum trmino.  Hay otros problemas que hacen imposible el procedimiento laparoscpico, como en caso de una infeccin avanzada o la ruptura del apndice. Si el cirujano estima que no es seguro seguir con el procedimiento laparoscpico cambiar a una ciruga de abdomen abierto. Esto le dar Neomia Dear mayor visin y campo para Printmaker. La ciruga abierta requiere un tiempo ms prolongado de recuperacin. Luego de extirpar el apndice, le cerrarn las incisiones con puntos (suturas) o con un adhesivo para la piel.  DESPUS DEL PROCEDIMIENTO  Lo trasladarn una sala de recuperacin. Cuando el efecto de la anestesia haya desaparecido, lo llevarn a su habitacin en el hospital. Tia Alert del procedimiento le administrarn medicamentos para el dolor para que se sienta confortable. Pregunte a su mdico cunto Furniture conservator/restorer hospital.    Esta informacin no tiene Theme park manager el consejo del mdico. Asegrese de hacerle al mdico cualquier pregunta que tenga.   Document Released: 12/23/2007 Document Revised: 12/24/2014 Elsevier Interactive Patient Education [employer]

## 2015-09-27 NOTE — Discharge Summary (Signed)
Physician Discharge Summary  Patient ID: Amy Lloyd MRN: [id number] DOB/AGE: 1986-03-17 29 y.o.  Admit date: 09/25/2015 Discharge date: 09/27/2015  Admitting Diagnosis: Acute appendicitis  Discharge Diagnosis Patient Active Problem List   Diagnosis Date Noted  . Appendicitis, acute, with peritonitis 09/25/2015    Consultants none  Imaging: Ct Abdomen Pelvis W Contrast  09/25/2015   CLINICAL DATA:  29 year old female with periumbilical pain and nausea since 0300 hr today. Initial encounter.  EXAM: CT ABDOMEN AND PELVIS WITH CONTRAST  TECHNIQUE: Multidetector CT imaging of the abdomen and pelvis was performed using the standard protocol following bolus administration of intravenous contrast.  CONTRAST:  OMNIPAQUE IOHEXOL 300 MG/ML  SOLN  COMPARISON:  Abdomen ultrasound 05/21/2011.  FINDINGS: Mild respiratory motion and dependent atelectasis at the lung bases. No pericardial or pleural effusion.  No acute osseous abnormality identified. L4-L5 central disc protrusion.  Small volume pelvic free fluid in the cul-de-sac. IUD. Physiologic bilateral adnexal cysts suspected, otherwise negative uterus and adnexa. Mostly decompressed distal colon. Diminutive urinary bladder. Mostly decompressed sigmoid colon.  Negative left colon, transverse colon, hepatic flexure and right colon. The cecum is not inflamed. Negative terminal ileum.  The appendix does not contain gas. The base of the appendix appears largely unremarkable (coronal image 36), however, there is progressive enlargement and irregular appearance of the appendix distally towards the tip. There is soft tissue thickening and stranding about the tip best seen on coronal image 41. The appendix in this region measures up to 8 mm diameter. It appears to contain fluid throughout its course. The soft tissue thickening in this region is somewhat inseparable from the right adnexa and some decompressed small bowel loops in the region. No  extraluminal gas. No free fluid.  No dilated small bowel.  Decompressed stomach and duodenum.  No pneumoperitoneum. Liver, gallbladder, spleen, pancreas, adrenal glands, and major arterial structures in the abdomen and pelvis are within normal limits. Portal venous system appears to remain patent. Punctate bilateral nephrolithiasis. No perinephric stranding. The right ureter is also in proximity to the abnormal appendix. No hydroureter. No lymphadenopathy.  IMPRESSION: 1. Acute appendicitis relatively sparing the base of the appendix at this time, most pronounced at the tip. Soft tissue thickening and stranding surrounding the tip of the appendix is also inseparable from the nearby right ureter, right adnexa, and regional decompressed small bowel loops. See coronal image 41. 2. No free air or evidence of perforation at this time. Trace amount of pelvic free fluid could be reactive or physiologic in this female patient.   Electronically Signed   By: Odessa Fleming M.D.   On: 09/25/2015 22:26    Procedures Laparoscopic appendectomy---Dr. Marta Lamas Course:  Amy Lloyd presented to [facility] with lower abdominal pain.  Workup showed acute appendicitis on CT.  Patient was admitted and underwent procedure listed above.  Tolerated procedure well and was transferred to the floor.  Diet was advanced as tolerated.  On POD#1, the patient was voiding well, tolerating diet, ambulating well, pain well controlled, vital signs stable, incisions c/d/i and felt stable for discharge home.  Medication risks, benefits and therapeutic alternatives were reviewed with the patient.  She verbalizes understanding.  Patient will follow up in our office in 2 weeks and knows to call with questions or concerns.  [employer] interpreter used (salvadore) instructions and follow up provided.   Physical Exam: General:  Alert, NAD, pleasant, comfortable Abd:  Soft, ND, mild tenderness, incisions C/D/I    Medication List  TAKE these medications        oxyCODONE-acetaminophen 5-325 MG tablet  Commonly known as:  PERCOCET/ROXICET  Take 1-2 tablets by mouth every 4 (four) hours as needed for moderate pain or severe pain.             Follow-up Information    Follow up with [facility] On 10/12/2015.   Specialty:  General Surgery   Why:  arrive by 10:45AM for a 11:15AM post operative check up    Contact information:   [address] [place] [address] [phone]       Signed: Ashok Norris, [facility] [phone]  09/27/2015, 8:43 AM

## 2015-09-27 NOTE — Discharge Planning (Signed)
IV removed. Discharge information given to patient. Interpreter utilized to give instructions in spanish. Pt sent with prescription and note for work. Patient states she does not have any questions at this time. Visitor came to the floor to take the patient home. Patient walked off the unit, offered a wheelchair but refused.

## 2015-09-30 LAB — CULTURE, BLOOD (ROUTINE X 2)
CULTURE: NO GROWTH
CULTURE: NO GROWTH

## 2016-07-29 ENCOUNTER — Emergency Department (HOSPITAL_COMMUNITY)
Admission: EM | Admit: 2016-07-29 | Discharge: 2016-07-30 | Disposition: A | Discharge status: Home / Self Care | Payer: Medicaid Other | Attending: Emergency Medicine | Admitting: Emergency Medicine

## 2016-07-29 ENCOUNTER — Encounter (HOSPITAL_COMMUNITY): Payer: Self-pay | Admitting: Emergency Medicine

## 2016-07-29 ENCOUNTER — Emergency Department (HOSPITAL_COMMUNITY): Payer: Self-pay

## 2016-07-29 DIAGNOSIS — R109 Unspecified abdominal pain: Secondary | ICD-10-CM

## 2016-07-29 DIAGNOSIS — K8020 Calculus of gallbladder without cholecystitis without obstruction: Secondary | ICD-10-CM | POA: Insufficient documentation

## 2016-07-29 DIAGNOSIS — N1330 Unspecified hydronephrosis: Secondary | ICD-10-CM

## 2016-07-29 DIAGNOSIS — R51 Headache: Secondary | ICD-10-CM | POA: Insufficient documentation

## 2016-07-29 DIAGNOSIS — R1011 Right upper quadrant pain: Secondary | ICD-10-CM

## 2016-07-29 DIAGNOSIS — R519 Headache, unspecified: Secondary | ICD-10-CM

## 2016-07-29 DIAGNOSIS — J029 Acute pharyngitis, unspecified: Secondary | ICD-10-CM

## 2016-07-29 DIAGNOSIS — N390 Urinary tract infection, site not specified: Secondary | ICD-10-CM

## 2016-07-29 LAB — CBC
HCT: 40.2 % (ref 36.0–46.0)
Hemoglobin: 13.6 g/dL (ref 12.0–15.0)
MCH: 28.8 pg (ref 26.0–34.0)
MCHC: 33.8 g/dL (ref 30.0–36.0)
MCV: 85 fL (ref 78.0–100.0)
PLATELETS: 195 10*3/uL (ref 150–400)
RBC: 4.73 MIL/uL (ref 3.87–5.11)
RDW: 12.7 % (ref 11.5–15.5)
WBC: 5.2 10*3/uL (ref 4.0–10.5)

## 2016-07-29 LAB — URINALYSIS, ROUTINE W REFLEX MICROSCOPIC
Bilirubin Urine: NEGATIVE
Glucose, UA: NEGATIVE mg/dL
Hgb urine dipstick: NEGATIVE
KETONES UR: 15 mg/dL — AB
NITRITE: NEGATIVE
PROTEIN: NEGATIVE mg/dL
Specific Gravity, Urine: 1.029 (ref 1.005–1.030)
pH: 6 (ref 5.0–8.0)

## 2016-07-29 LAB — COMPREHENSIVE METABOLIC PANEL
ALT: 14 U/L (ref 14–54)
AST: 16 U/L (ref 15–41)
Albumin: 3.9 g/dL (ref 3.5–5.0)
Alkaline Phosphatase: 73 U/L (ref 38–126)
Anion gap: 8 (ref 5–15)
BILIRUBIN TOTAL: 0.8 mg/dL (ref 0.3–1.2)
BUN: 9 mg/dL (ref 6–20)
CALCIUM: 9 mg/dL (ref 8.9–10.3)
CHLORIDE: 106 mmol/L (ref 101–111)
CO2: 24 mmol/L (ref 22–32)
CREATININE: 0.73 mg/dL (ref 0.44–1.00)
Glucose, Bld: 90 mg/dL (ref 65–99)
Potassium: 3.8 mmol/L (ref 3.5–5.1)
Sodium: 138 mmol/L (ref 135–145)
TOTAL PROTEIN: 7.1 g/dL (ref 6.5–8.1)

## 2016-07-29 LAB — URINE MICROSCOPIC-ADD ON

## 2016-07-29 LAB — LIPASE, BLOOD: LIPASE: 23 U/L (ref 11–51)

## 2016-07-29 LAB — RAPID STREP SCREEN (MED CTR MEBANE ONLY): STREPTOCOCCUS, GROUP A SCREEN (DIRECT): NEGATIVE

## 2016-07-29 LAB — POC URINE PREG, ED: PREG TEST UR: NEGATIVE

## 2016-07-29 MED ORDER — SODIUM CHLORIDE 0.9 % IV BOLUS (SEPSIS)
1000.0000 mL | Freq: Once | INTRAVENOUS | Status: AC
  Administered 2016-07-29: 1000 mL via INTRAVENOUS

## 2016-07-29 MED ORDER — ONDANSETRON HCL 4 MG/2ML IJ SOLN
4.0000 mg | Freq: Once | INTRAMUSCULAR | Status: AC
  Administered 2016-07-29: 4 mg via INTRAVENOUS
  Filled 2016-07-29: qty 2

## 2016-07-29 MED ORDER — CEPHALEXIN 250 MG PO CAPS
500.0000 mg | ORAL_CAPSULE | Freq: Once | ORAL | Status: AC
  Administered 2016-07-30: 500 mg via ORAL
  Filled 2016-07-29: qty 2

## 2016-07-29 MED ORDER — HYDROCODONE-ACETAMINOPHEN 5-325 MG PO TABS: 1.0000 | ORAL_TABLET | Freq: Four times a day (QID) | ORAL | 0 refills | Status: DC | PRN

## 2016-07-29 MED ORDER — MORPHINE SULFATE (PF) 4 MG/ML IV SOLN
4.0000 mg | Freq: Once | INTRAVENOUS | Status: AC
  Administered 2016-07-29: 4 mg via INTRAVENOUS
  Filled 2016-07-29: qty 1

## 2016-07-29 MED ORDER — CEPHALEXIN 500 MG PO CAPS: 500.0000 mg | ORAL_CAPSULE | Freq: Four times a day (QID) | ORAL | 0 refills | Status: DC

## 2016-07-29 NOTE — ED Provider Notes (Signed)
[facility]-EMERGENCY DEPT Provider Note   CSN: [id number] Arrival date & time: 07/29/16  2003  First Provider Contact:  First MD Initiated Contact with Patient 07/29/16 2037        History   Chief Complaint Chief Complaint  Patient presents with  . Abdominal Pain  . Headache    HPI Amy Lloyd is a 30 y.o. female.  Patient presents c/o ruq pain, and also c/o frontal headache and sore throat. Symptoms present for past day. Symptoms gradual in onset, constant, persistent, moderate. No trouble breathing or swallowing. With abdominal pain, +nausea. No vomiting or diarrhea. No dysuria or hematuria. No lower abd or pelvic pain. Frontal headache, gradual onset, mild, c/w prior headaches. No eye pain or change in vision. No recent head trauma/injury. No neck pain or stiffness. No numbness/weakness. No change in speech or vision. Steady gait. No syncope. Sore throat moderate, no trouble swallowing. No known ill contacts. No fever or chills.       The history is provided by the patient.  Abdominal Pain   Associated symptoms include headaches. Pertinent negatives include fever, vomiting and dysuria.  Headache   Pertinent negatives include no fever, no shortness of breath and no vomiting.    Past Medical History:  Diagnosis Date  . Gallbladder attack     Patient Active Problem List   Diagnosis Date Noted  . Appendicitis, acute, with peritonitis 09/25/2015    Past Surgical History:  Procedure Laterality Date  . APPENDECTOMY  09/26/2015   "lap"  . LAPAROSCOPIC APPENDECTOMY N/A 09/26/2015   Procedure: APPENDECTOMY LAPAROSCOPIC;  Surgeon: Emelia LoronMatthew Wakefield, MD;  Location: [facility] OR;  Service: General;  Laterality: N/A;    OB History    Gravida Para Term Preterm AB Living   2 2 2     2    SAB TAB Ectopic Multiple Live Births           1       Home Medications    Prior to Admission medications   Medication Sig Start Date End Date Taking? Authorizing Provider    oxyCODONE-acetaminophen (PERCOCET/ROXICET) 5-325 MG tablet Take 1-2 tablets by mouth every 4 (four) hours as needed for moderate pain or severe pain. 09/27/15   Ashok NorrisEmina Riebock, NP    Family History History reviewed. No pertinent family history.  Social History Social History  Substance Use Topics  . Smoking status: Never Smoker  . Smokeless tobacco: Never Used  . Alcohol use No     Allergies   Review of patient's allergies indicates no known allergies.   Review of Systems Review of Systems  Constitutional: Negative for fever.  HENT: Positive for rhinorrhea and sore throat. Negative for sinus pressure.   Eyes: Negative for photophobia, pain, redness and visual disturbance.  Respiratory: Negative for cough and shortness of breath.   Cardiovascular: Negative for chest pain.  Gastrointestinal: Positive for abdominal pain. Negative for vomiting.  Genitourinary: Negative for dysuria and flank pain.  Musculoskeletal: Negative for back pain and neck pain.  Skin: Negative for rash.  Neurological: Positive for headaches.  Hematological: Does not bruise/bleed easily.  Psychiatric/Behavioral: Negative for confusion.     Physical Exam Updated Vital Signs BP 116/76 (BP Location: Right Arm)   Pulse 73   Temp 98.1 F (36.7 C) (Oral)   LMP 07/02/2016   SpO2 100%   Physical Exam  Constitutional: She appears well-developed and well-nourished. No distress.  HENT:  Head: Atraumatic.  Nose: Nose normal.  Mouth/Throat: No oropharyngeal exudate.  No  sinus or temporal tenderness.   Pharynx mildly erythematous. No asymmetric swelling or abscess.    Eyes: Conjunctivae and EOM are normal. Pupils are equal, round, and reactive to light. No scleral icterus.  Fundoscopic exam unremarkable.   Neck: Neck supple. No tracheal deviation present. No thyromegaly present.  No stiffness or rigidity.   Cardiovascular: Normal rate, regular rhythm, normal heart sounds and intact distal pulses.  Exam  reveals no gallop and no friction rub.   No murmur heard. Pulmonary/Chest: Effort normal and breath sounds normal. No respiratory distress.  Abdominal: Soft. Normal appearance and bowel sounds are normal. She exhibits no distension and no mass. There is tenderness. There is no rebound and no guarding. No hernia.  ruq tenderness.   Genitourinary:  Genitourinary Comments: No definite/clear cva tenderness.  Musculoskeletal: Normal range of motion. She exhibits no edema or tenderness.  Lymphadenopathy:    She has no cervical adenopathy.  Neurological: She is alert. No cranial nerve deficit.  Motor intact bilaterally. Steady gait.   Skin: Skin is warm and dry. No rash noted. She is not diaphoretic.  Psychiatric: She has a normal mood and affect.  Nursing note and vitals reviewed.    ED Treatments / Results  Labs (all labs ordered are listed, but only abnormal results are displayed) Results for orders placed or performed during the hospital encounter of 07/29/16  Rapid strep screen (not at [facility])  Result Value Ref Range   Streptococcus, Group A Screen (Direct) NEGATIVE NEGATIVE  Lipase, blood  Result Value Ref Range   Lipase 23 11 - 51 U/L  Comprehensive metabolic panel  Result Value Ref Range   Sodium 138 135 - 145 mmol/L   Potassium 3.8 3.5 - 5.1 mmol/L   Chloride 106 101 - 111 mmol/L   CO2 24 22 - 32 mmol/L   Glucose, Bld 90 65 - 99 mg/dL   BUN 9 6 - 20 mg/dL   Creatinine, Ser 1.61 0.44 - 1.00 mg/dL   Calcium 9.0 8.9 - 09.6 mg/dL   Total Protein 7.1 6.5 - 8.1 g/dL   Albumin 3.9 3.5 - 5.0 g/dL   AST 16 15 - 41 U/L   ALT 14 14 - 54 U/L   Alkaline Phosphatase 73 38 - 126 U/L   Total Bilirubin 0.8 0.3 - 1.2 mg/dL   GFR calc non Af Amer >60 >60 mL/min   GFR calc Af Amer >60 >60 mL/min   Anion gap 8 5 - 15  CBC  Result Value Ref Range   WBC 5.2 4.0 - 10.5 K/uL   RBC 4.73 3.87 - 5.11 MIL/uL   Hemoglobin 13.6 12.0 - 15.0 g/dL   HCT 04.5 40.9 - 81.1 %   MCV 85.0 78.0 - 100.0  fL   MCH 28.8 26.0 - 34.0 pg   MCHC 33.8 30.0 - 36.0 g/dL   RDW 91.4 78.2 - 95.6 %   Platelets 195 150 - 400 K/uL  Urinalysis, Routine w reflex microscopic  Result Value Ref Range   Color, Urine YELLOW YELLOW   APPearance CLOUDY (A) CLEAR   Specific Gravity, Urine 1.029 1.005 - 1.030   pH 6.0 5.0 - 8.0   Glucose, UA NEGATIVE NEGATIVE mg/dL   Hgb urine dipstick NEGATIVE NEGATIVE   Bilirubin Urine NEGATIVE NEGATIVE   Ketones, ur 15 (A) NEGATIVE mg/dL   Protein, ur NEGATIVE NEGATIVE mg/dL   Nitrite NEGATIVE NEGATIVE   Leukocytes, UA MODERATE (A) NEGATIVE  Urine microscopic-add on  Result Value Ref Range  Squamous Epithelial / LPF 6-30 (A) NONE SEEN   WBC, UA 6-30 0 - 5 WBC/hpf   RBC / HPF 0-5 0 - 5 RBC/hpf   Bacteria, UA MANY (A) NONE SEEN   Urine-Other MUCOUS PRESENT   POC urine preg, ED  Result Value Ref Range   Preg Test, Ur NEGATIVE NEGATIVE   US Abdomen Complete  Result Date: 07/29/2016 CLINICAL DATA:  Initial evaluation for acute right upper quadrant pain. EXAM: ABDOMEN ULTRASOUND COMPLETE COMPARISON:  Prior CT from 09/25/2015. FINDINGS: Gallbladder: Large approximately 4 mm stone present within the gallbladder lumen. Associated gallbladder sludge. No sonographic Murphy sign elicited on exam. Gallbladder wall thickened up to 5 mm, which may be related to incomplete distension. No free pericholecystic fluid. Common bile duct: Diameter: 3 mm Liver: No focal lesion identified. Within normal limits in parenchymal echogenicity. IVC: No abnormality visualized. Pancreas: Visualized portion unremarkable. Spleen: Size and appearance within normal limits. Right Kidney: Length: 9.8 cm. Echogenicity within normal limits. No mass or hydronephrosis visualized. Left Kidney: Length: 10.5 cm. Echogenicity within normal limits. No mass or hydronephrosis visualized. Abdominal aorta: No aneurysm visualized. Other findings: None. IMPRESSION: 1. Cholelithiasis with gallbladder sludge. Gallbladder wall  is thickened up to 5 mm, which may in part be related to gallbladder contraction. No free pericholecystic fluid or sonographic Murphy's sign elicited on exam. No biliary dilatation. 2. Otherwise normal abdominal ultrasound. Specifically, normal appearance of the kidneys. No nephrolithiasis or hydronephrosis. Electronically Signed   By: Rise Mu M.D.   On: 07/29/2016 23:32    EKG  EKG Interpretation None       Radiology US Abdomen Complete  Result Date: 07/29/2016 CLINICAL DATA:  Initial evaluation for acute right upper quadrant pain. EXAM: ABDOMEN ULTRASOUND COMPLETE COMPARISON:  Prior CT from 09/25/2015. FINDINGS: Gallbladder: Large approximately 4 mm stone present within the gallbladder lumen. Associated gallbladder sludge. No sonographic Murphy sign elicited on exam. Gallbladder wall thickened up to 5 mm, which may be related to incomplete distension. No free pericholecystic fluid. Common bile duct: Diameter: 3 mm Liver: No focal lesion identified. Within normal limits in parenchymal echogenicity. IVC: No abnormality visualized. Pancreas: Visualized portion unremarkable. Spleen: Size and appearance within normal limits. Right Kidney: Length: 9.8 cm. Echogenicity within normal limits. No mass or hydronephrosis visualized. Left Kidney: Length: 10.5 cm. Echogenicity within normal limits. No mass or hydronephrosis visualized. Abdominal aorta: No aneurysm visualized. Other findings: None. IMPRESSION: 1. Cholelithiasis with gallbladder sludge. Gallbladder wall is thickened up to 5 mm, which may in part be related to gallbladder contraction. No free pericholecystic fluid or sonographic Murphy's sign elicited on exam. No biliary dilatation. 2. Otherwise normal abdominal ultrasound. Specifically, normal appearance of the kidneys. No nephrolithiasis or hydronephrosis. Electronically Signed   By: Rise Mu M.D.   On: 07/29/2016 23:32    Procedures Procedures (including critical care  time)  Medications Ordered in ED Medications  sodium chloride 0.9 % bolus 1,000 mL (not administered)  morphine 4 MG/ML injection 4 mg (not administered)  ondansetron (ZOFRAN) injection 4 mg (not administered)     Initial Impression / Assessment and Plan / ED Course  I have reviewed the triage vital signs and the nursing notes.  Pertinent labs & imaging results that were available during my care of the patient were reviewed by me and considered in my medical decision making (see chart for details).  Clinical Course    Iv ns. Labs. U/s. Morphine iv. zofran iv.   Reviewed nursing notes and prior charts  for additional history.   ua with LE pos, many bact, 6-30 wbc, will rx.   U/s with gallstones, no pericholecystic fluid.   On recheck, ruq pain improved, abd soft nt.   Discussed labs, u/s with patient.    Will refer to gen surgery f/u re gallstones.     Final Clinical Impressions(s) / ED Diagnoses   Final diagnoses:  None    New Prescriptions New Prescriptions   No medications on file     Cathren Laine, MD 07/29/16 2343

## 2016-07-29 NOTE — ED Triage Notes (Signed)
Using interpreter service: Pain in right side of abdomen since last night. Has gallbladder issues and it feels the same, but worse. Nausea, no vomiting. Also reports headache for four days.

## 2016-07-29 NOTE — ED Notes (Signed)
Patient transported to Ultrasound 

## 2016-07-29 NOTE — Discharge Instructions (Signed)
It was our pleasure to provide your ER care today - we hope that you feel better.  Your ultrasound shows gallstones - follow up with general surgeon in the next 1-2 weeks -see referral - call office tomorrow to arrange appointment.   Take antibiotic as prescribed for suspected urine infection.  Take motrin or aleve as need for pain. You may also take hydrocodone as need for pain. No driving when taking hydrocodone. Also, do not take tylenol or acetaminophen containing medication when taking hydrocodone.  Return to ER if worse, new symptoms, fevers, worsening or severe pain, other concern.  You were given pain medication in the ER - no driving for the next 6 hours.

## 2016-08-01 LAB — CULTURE, GROUP A STREP (THRC)

## 2017-04-03 ENCOUNTER — Encounter (HOSPITAL_COMMUNITY): Payer: Self-pay | Admitting: Emergency Medicine

## 2017-04-03 ENCOUNTER — Emergency Department (HOSPITAL_COMMUNITY): Payer: Self-pay

## 2017-04-03 ENCOUNTER — Emergency Department (HOSPITAL_COMMUNITY)
Admission: EM | Admit: 2017-04-03 | Discharge: 2017-04-03 | Disposition: A | Discharge status: Home / Self Care | Payer: Self-pay | Attending: Emergency Medicine | Admitting: Emergency Medicine

## 2017-04-03 DIAGNOSIS — Y939 Activity, unspecified: Secondary | ICD-10-CM | POA: Insufficient documentation

## 2017-04-03 DIAGNOSIS — R109 Unspecified abdominal pain: Secondary | ICD-10-CM | POA: Insufficient documentation

## 2017-04-03 DIAGNOSIS — Y999 Unspecified external cause status: Secondary | ICD-10-CM | POA: Insufficient documentation

## 2017-04-03 DIAGNOSIS — Y92410 Unspecified street and highway as the place of occurrence of the external cause: Secondary | ICD-10-CM | POA: Insufficient documentation

## 2017-04-03 DIAGNOSIS — M25511 Pain in right shoulder: Secondary | ICD-10-CM | POA: Insufficient documentation

## 2017-04-03 DIAGNOSIS — M7918 Myalgia, other site: Secondary | ICD-10-CM

## 2017-04-03 LAB — URINALYSIS, ROUTINE W REFLEX MICROSCOPIC
BILIRUBIN URINE: NEGATIVE
Glucose, UA: NEGATIVE mg/dL
Hgb urine dipstick: NEGATIVE
Ketones, ur: NEGATIVE mg/dL
NITRITE: NEGATIVE
PH: 5 (ref 5.0–8.0)
Protein, ur: NEGATIVE mg/dL
SPECIFIC GRAVITY, URINE: 1.026 (ref 1.005–1.030)

## 2017-04-03 LAB — PREGNANCY, URINE: PREG TEST UR: NEGATIVE

## 2017-04-03 MED ORDER — IBUPROFEN 600 MG PO TABS: 600.0000 mg | ORAL_TABLET | Freq: Four times a day (QID) | ORAL | 0 refills | Status: DC | PRN

## 2017-04-03 MED ORDER — CYCLOBENZAPRINE HCL 10 MG PO TABS: 10.0000 mg | ORAL_TABLET | Freq: Two times a day (BID) | ORAL | 0 refills | Status: DC | PRN

## 2017-04-03 MED ORDER — HYDROCODONE-ACETAMINOPHEN 5-325 MG PO TABS
1.0000 | ORAL_TABLET | Freq: Once | ORAL | Status: AC
  Administered 2017-04-03: 1 via ORAL
  Filled 2017-04-03: qty 1
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
## 2017-04-03 NOTE — ED Notes (Signed)
Patient transported to X-ray 
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
## 2017-04-16 ENCOUNTER — Emergency Department (HOSPITAL_COMMUNITY)
Admission: EM | Admit: 2017-04-16 | Discharge: 2017-04-16 | Disposition: A | Discharge status: Home / Self Care | Payer: Self-pay | Attending: Physician Assistant | Admitting: Physician Assistant

## 2017-04-16 ENCOUNTER — Encounter (HOSPITAL_COMMUNITY): Payer: Self-pay | Admitting: Emergency Medicine

## 2017-04-16 ENCOUNTER — Emergency Department (HOSPITAL_COMMUNITY): Payer: Self-pay

## 2017-04-16 DIAGNOSIS — M545 Low back pain, unspecified: Secondary | ICD-10-CM

## 2017-04-16 DIAGNOSIS — S3992XA Unspecified injury of lower back, initial encounter: Secondary | ICD-10-CM | POA: Insufficient documentation

## 2017-04-16 DIAGNOSIS — Z79899 Other long term (current) drug therapy: Secondary | ICD-10-CM | POA: Insufficient documentation

## 2017-04-16 DIAGNOSIS — Y999 Unspecified external cause status: Secondary | ICD-10-CM | POA: Insufficient documentation

## 2017-04-16 DIAGNOSIS — Y92410 Unspecified street and highway as the place of occurrence of the external cause: Secondary | ICD-10-CM | POA: Insufficient documentation

## 2017-04-16 DIAGNOSIS — Y939 Activity, unspecified: Secondary | ICD-10-CM | POA: Insufficient documentation

## 2017-04-16 LAB — PREGNANCY, URINE: PREG TEST UR: NEGATIVE

## 2017-04-16 MED ORDER — IBUPROFEN 100 MG/5ML PO SUSP: 10.0000 mg/kg | Freq: Four times a day (QID) | ORAL | 0 refills | Status: DC | PRN

## 2017-04-16 MED ORDER — IBUPROFEN 100 MG/5ML PO SUSP
600.0000 mg | Freq: Once | ORAL | Status: AC
  Administered 2017-04-16: 600 mg via ORAL
  Filled 2017-04-16: qty 30

## 2017-04-16 MED ORDER — CYCLOBENZAPRINE HCL 10 MG PO TABS: 10.0000 mg | ORAL_TABLET | Freq: Two times a day (BID) | ORAL | 0 refills | Status: DC | PRN

## 2017-04-16 NOTE — ED Notes (Signed)
ED Provider at bedside. 

## 2017-04-16 NOTE — ED Notes (Signed)
Patient transported to X-ray 
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
## 2017-04-16 NOTE — ED Notes (Signed)
c/o lower back pain onset Sat. States the pain radiates into the left hip. Denies injury
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
## 2017-04-16 NOTE — Discharge Instructions (Signed)
As we discussed this likely her muscles that are causing pain in her back. Please return with any concerns.

## 2017-04-16 NOTE — ED Notes (Signed)
Pt stable, understands discharge instructions, and reasons for return.   

## 2017-11-12 ENCOUNTER — Observation Stay (HOSPITAL_COMMUNITY): Payer: Self-pay | Admitting: Anesthesiology

## 2017-11-12 ENCOUNTER — Encounter (HOSPITAL_COMMUNITY)
Admission: EM | Disposition: A | Discharge status: Home / Self Care | Payer: Self-pay | Source: Home / Self Care | Attending: Emergency Medicine

## 2017-11-12 ENCOUNTER — Encounter (HOSPITAL_COMMUNITY): Payer: Self-pay | Admitting: Emergency Medicine

## 2017-11-12 ENCOUNTER — Emergency Department (HOSPITAL_COMMUNITY): Payer: Self-pay

## 2017-11-12 ENCOUNTER — Other Ambulatory Visit: Payer: Self-pay

## 2017-11-12 ENCOUNTER — Observation Stay (HOSPITAL_COMMUNITY)
Admission: EM | Admit: 2017-11-12 | Discharge: 2017-11-13 | Disposition: A | Discharge status: Home / Self Care | Payer: Self-pay | Attending: General Surgery | Admitting: General Surgery

## 2017-11-12 DIAGNOSIS — K8020 Calculus of gallbladder without cholecystitis without obstruction: Secondary | ICD-10-CM

## 2017-11-12 DIAGNOSIS — K819 Cholecystitis, unspecified: Secondary | ICD-10-CM | POA: Diagnosis present

## 2017-11-12 DIAGNOSIS — K8010 Calculus of gallbladder with chronic cholecystitis without obstruction: Principal | ICD-10-CM | POA: Insufficient documentation

## 2017-11-12 DIAGNOSIS — K8050 Calculus of bile duct without cholangitis or cholecystitis without obstruction: Secondary | ICD-10-CM

## 2017-11-12 DIAGNOSIS — K8000 Calculus of gallbladder with acute cholecystitis without obstruction: Secondary | ICD-10-CM

## 2017-11-12 HISTORY — PX: CHOLECYSTECTOMY: SHX55

## 2017-11-12 LAB — COMPREHENSIVE METABOLIC PANEL
ALBUMIN: 3.5 g/dL (ref 3.5–5.0)
ALK PHOS: 79 U/L (ref 38–126)
ALT: 14 U/L (ref 14–54)
AST: 19 U/L (ref 15–41)
Anion gap: 7 (ref 5–15)
BILIRUBIN TOTAL: 0.3 mg/dL (ref 0.3–1.2)
BUN: 10 mg/dL (ref 6–20)
CO2: 25 mmol/L (ref 22–32)
Calcium: 8.7 mg/dL — ABNORMAL LOW (ref 8.9–10.3)
Chloride: 106 mmol/L (ref 101–111)
Creatinine, Ser: 0.77 mg/dL (ref 0.44–1.00)
GFR calc Af Amer: >60 mL/min (ref >60)
GLUCOSE: 115 mg/dL — AB (ref 65–99)
Potassium: 3.3 mmol/L — ABNORMAL LOW (ref 3.5–5.1)
Sodium: 138 mmol/L (ref 135–145)
Total Protein: 6.7 g/dL (ref 6.5–8.1)

## 2017-11-12 LAB — URINALYSIS, ROUTINE W REFLEX MICROSCOPIC
Bilirubin Urine: NEGATIVE
Glucose, UA: NEGATIVE mg/dL
HGB URINE DIPSTICK: NEGATIVE
KETONES UR: NEGATIVE mg/dL
Nitrite: NEGATIVE
PROTEIN: NEGATIVE mg/dL
Specific Gravity, Urine: 1.019 (ref 1.005–1.030)
pH: 7 (ref 5.0–8.0)

## 2017-11-12 LAB — CBC
HEMATOCRIT: 39 % (ref 36.0–46.0)
HEMOGLOBIN: 13.6 g/dL (ref 12.0–15.0)
MCH: 30.4 pg (ref 26.0–34.0)
MCHC: 34.9 g/dL (ref 30.0–36.0)
MCV: 87.2 fL (ref 78.0–100.0)
Platelets: 185 10*3/uL (ref 150–400)
RBC: 4.47 MIL/uL (ref 3.87–5.11)
RDW: 12 % (ref 11.5–15.5)
WBC: 6.7 10*3/uL (ref 4.0–10.5)

## 2017-11-12 LAB — LIPASE, BLOOD: Lipase: 28 U/L (ref 11–51)

## 2017-11-12 LAB — I-STAT BETA HCG BLOOD, ED (MC, WL, AP ONLY)

## 2017-11-12 SURGERY — LAPAROSCOPIC CHOLECYSTECTOMY
Anesthesia: General

## 2017-11-12 MED ORDER — DIPHENHYDRAMINE HCL 25 MG PO CAPS: 25.0000 mg | ORAL_CAPSULE | Freq: Four times a day (QID) | ORAL | Status: DC | PRN

## 2017-11-12 MED ORDER — LIDOCAINE HCL (CARDIAC) 20 MG/ML IV SOLN
INTRAVENOUS | Status: DC | PRN
  Administered 2017-11-12: 50 mg via INTRATRACHEAL

## 2017-11-12 MED ORDER — MIDAZOLAM HCL 5 MG/5ML IJ SOLN
INTRAMUSCULAR | Status: DC | PRN
  Administered 2017-11-12: 2 mg via INTRAVENOUS

## 2017-11-12 MED ORDER — PROPOFOL 10 MG/ML IV BOLUS
INTRAVENOUS | Status: AC
  Filled 2017-11-12: qty 20

## 2017-11-12 MED ORDER — ACETAMINOPHEN 160 MG/5ML PO SOLN: 325.0000 mg | ORAL | Status: DC | PRN

## 2017-11-12 MED ORDER — EPHEDRINE 5 MG/ML INJ
INTRAVENOUS | Status: AC
  Filled 2017-11-12: qty 10

## 2017-11-12 MED ORDER — SUCCINYLCHOLINE CHLORIDE 200 MG/10ML IV SOSY
PREFILLED_SYRINGE | INTRAVENOUS | Status: AC
  Filled 2017-11-12: qty 10

## 2017-11-12 MED ORDER — EPHEDRINE SULFATE 50 MG/ML IJ SOLN
INTRAMUSCULAR | Status: DC | PRN
  Administered 2017-11-12 (×2): 5 mg via INTRAVENOUS

## 2017-11-12 MED ORDER — LIDOCAINE 2% (20 MG/ML) 5 ML SYRINGE
INTRAMUSCULAR | Status: AC
  Filled 2017-11-12: qty 5

## 2017-11-12 MED ORDER — OXYCODONE HCL 5 MG/5ML PO SOLN: 5.0000 mg | Freq: Once | ORAL | Status: DC | PRN

## 2017-11-12 MED ORDER — DEXAMETHASONE SODIUM PHOSPHATE 10 MG/ML IJ SOLN
INTRAMUSCULAR | Status: DC | PRN
  Administered 2017-11-12: 10 mg via INTRAVENOUS

## 2017-11-12 MED ORDER — MIDAZOLAM HCL 2 MG/2ML IJ SOLN
INTRAMUSCULAR | Status: AC
  Filled 2017-11-12: qty 2

## 2017-11-12 MED ORDER — PHENYLEPHRINE HCL 10 MG/ML IJ SOLN
INTRAMUSCULAR | Status: DC | PRN
  Administered 2017-11-12: 80 ug via INTRAVENOUS

## 2017-11-12 MED ORDER — ROCURONIUM BROMIDE 100 MG/10ML IV SOLN
INTRAVENOUS | Status: DC | PRN
  Administered 2017-11-12: 60 mg via INTRAVENOUS

## 2017-11-12 MED ORDER — DIPHENHYDRAMINE HCL 50 MG/ML IJ SOLN
25.0000 mg | Freq: Four times a day (QID) | INTRAMUSCULAR | Status: DC | PRN
Start: 2017-11-12 — End: 2017-11-12

## 2017-11-12 MED ORDER — DEXTROSE 5 % IV SOLN
2.0000 g | INTRAVENOUS | Status: DC
  Filled 2017-11-12: qty 2

## 2017-11-12 MED ORDER — ACETAMINOPHEN 325 MG PO TABS: 650.0000 mg | ORAL_TABLET | Freq: Four times a day (QID) | ORAL | Status: DC | PRN

## 2017-11-12 MED ORDER — ONDANSETRON 4 MG PO TBDP: 4.0000 mg | ORAL_TABLET | Freq: Four times a day (QID) | ORAL | Status: DC | PRN

## 2017-11-12 MED ORDER — HYDROMORPHONE HCL 1 MG/ML IJ SOLN
1.0000 mg | Freq: Once | INTRAMUSCULAR | Status: AC
  Administered 2017-11-12: 1 mg via INTRAVENOUS
  Filled 2017-11-12: qty 1

## 2017-11-12 MED ORDER — BUPIVACAINE-EPINEPHRINE (PF) 0.25% -1:200000 IJ SOLN
INTRAMUSCULAR | Status: AC
  Filled 2017-11-12: qty 30

## 2017-11-12 MED ORDER — BUPIVACAINE-EPINEPHRINE 0.25% -1:200000 IJ SOLN
INTRAMUSCULAR | Status: DC | PRN
  Administered 2017-11-12: 10 mL

## 2017-11-12 MED ORDER — ONDANSETRON HCL 4 MG/2ML IJ SOLN
4.0000 mg | Freq: Once | INTRAMUSCULAR | Status: AC
  Administered 2017-11-12: 4 mg via INTRAVENOUS
  Filled 2017-11-12: qty 2

## 2017-11-12 MED ORDER — ACETAMINOPHEN 325 MG PO TABS: 325.0000 mg | ORAL_TABLET | ORAL | Status: DC | PRN

## 2017-11-12 MED ORDER — OXYCODONE HCL 5 MG PO TABS
5.0000 mg | ORAL_TABLET | ORAL | Status: DC | PRN
  Administered 2017-11-12 – 2017-11-13 (×3): 10 mg via ORAL
  Filled 2017-11-12 (×3): qty 2

## 2017-11-12 MED ORDER — SODIUM CHLORIDE 0.9 % IV BOLUS (SEPSIS)
1000.0000 mL | Freq: Once | INTRAVENOUS | Status: AC
  Administered 2017-11-12: 1000 mL via INTRAVENOUS

## 2017-11-12 MED ORDER — FENTANYL CITRATE (PF) 100 MCG/2ML IJ SOLN
INTRAMUSCULAR | Status: AC
  Administered 2017-11-12: 50 ug via INTRAVENOUS
  Filled 2017-11-12: qty 2

## 2017-11-12 MED ORDER — SUGAMMADEX SODIUM 200 MG/2ML IV SOLN
INTRAVENOUS | Status: DC | PRN
  Administered 2017-11-12: 150 mg via INTRAVENOUS

## 2017-11-12 MED ORDER — FENTANYL CITRATE (PF) 100 MCG/2ML IJ SOLN
50.0000 ug | INTRAMUSCULAR | Status: AC | PRN
Start: 2017-11-12 — End: 2017-11-12
  Administered 2017-11-12 (×4): 50 ug via INTRAVENOUS
  Administered 2017-11-12: 100 ug via INTRAVENOUS
  Filled 2017-11-12: qty 2

## 2017-11-12 MED ORDER — MORPHINE SULFATE (PF) 4 MG/ML IV SOLN
2.0000 mg | INTRAVENOUS | Status: DC | PRN
  Administered 2017-11-12 – 2017-11-13 (×2): 2 mg via INTRAVENOUS
  Filled 2017-11-12 (×2): qty 1

## 2017-11-12 MED ORDER — SUGAMMADEX SODIUM 200 MG/2ML IV SOLN
INTRAVENOUS | Status: AC
  Filled 2017-11-12: qty 2

## 2017-11-12 MED ORDER — SIMETHICONE 80 MG PO CHEW: 40.0000 mg | CHEWABLE_TABLET | Freq: Four times a day (QID) | ORAL | Status: DC | PRN

## 2017-11-12 MED ORDER — DEXTROSE 5 % IV SOLN
2.0000 g | INTRAVENOUS | Status: DC
  Administered 2017-11-12: 2 g via INTRAVENOUS
  Filled 2017-11-12: qty 2

## 2017-11-12 MED ORDER — DEXAMETHASONE SODIUM PHOSPHATE 10 MG/ML IJ SOLN
INTRAMUSCULAR | Status: AC
  Filled 2017-11-12: qty 1

## 2017-11-12 MED ORDER — ONDANSETRON HCL 4 MG/2ML IJ SOLN
INTRAMUSCULAR | Status: AC
  Filled 2017-11-12: qty 2

## 2017-11-12 MED ORDER — HYDROMORPHONE HCL 1 MG/ML IJ SOLN: 0.5000 mg | INTRAMUSCULAR | Status: DC | PRN

## 2017-11-12 MED ORDER — FENTANYL CITRATE (PF) 250 MCG/5ML IJ SOLN
INTRAMUSCULAR | Status: AC
  Filled 2017-11-12: qty 5

## 2017-11-12 MED ORDER — LACTATED RINGERS IV SOLN
INTRAVENOUS | Status: DC
  Administered 2017-11-12 (×2): via INTRAVENOUS

## 2017-11-12 MED ORDER — PROPOFOL 10 MG/ML IV BOLUS
INTRAVENOUS | Status: DC | PRN
  Administered 2017-11-12: 20 mg via INTRAVENOUS
  Administered 2017-11-12: 120 mg via INTRAVENOUS

## 2017-11-12 MED ORDER — ACETAMINOPHEN 650 MG RE SUPP
650.0000 mg | Freq: Four times a day (QID) | RECTAL | Status: DC | PRN
Start: 2017-11-12 — End: 2017-11-12

## 2017-11-12 MED ORDER — FENTANYL CITRATE (PF) 100 MCG/2ML IJ SOLN
25.0000 ug | INTRAMUSCULAR | Status: DC | PRN
  Administered 2017-11-12 (×4): 50 ug via INTRAVENOUS

## 2017-11-12 MED ORDER — OXYCODONE HCL 5 MG PO TABS: 5.0000 mg | ORAL_TABLET | Freq: Once | ORAL | Status: DC | PRN

## 2017-11-12 MED ORDER — PHENYLEPHRINE 40 MCG/ML (10ML) SYRINGE FOR IV PUSH (FOR BLOOD PRESSURE SUPPORT)
PREFILLED_SYRINGE | INTRAVENOUS | Status: AC
  Filled 2017-11-12: qty 10

## 2017-11-12 MED ORDER — SODIUM CHLORIDE 0.9 % IV SOLN
INTRAVENOUS | Status: DC
  Administered 2017-11-12: 22:00:00 via INTRAVENOUS

## 2017-11-12 MED ORDER — ONDANSETRON HCL 4 MG/2ML IJ SOLN
4.0000 mg | Freq: Four times a day (QID) | INTRAMUSCULAR | Status: DC | PRN
  Administered 2017-11-12: 4 mg via INTRAVENOUS
  Filled 2017-11-12: qty 2

## 2017-11-12 MED ORDER — METHOCARBAMOL 500 MG PO TABS
500.0000 mg | ORAL_TABLET | Freq: Four times a day (QID) | ORAL | Status: DC | PRN
  Administered 2017-11-12: 500 mg via ORAL
  Filled 2017-11-12: qty 1

## 2017-11-12 MED ORDER — ONDANSETRON HCL 4 MG/2ML IJ SOLN: 4.0000 mg | Freq: Four times a day (QID) | INTRAMUSCULAR | Status: DC | PRN

## 2017-11-12 MED ORDER — ACETAMINOPHEN 650 MG RE SUPP: 650.0000 mg | Freq: Four times a day (QID) | RECTAL | Status: DC | PRN

## 2017-11-12 MED ORDER — IBUPROFEN 600 MG PO TABS: 600.0000 mg | ORAL_TABLET | Freq: Four times a day (QID) | ORAL | Status: DC | PRN

## 2017-11-12 MED ORDER — ROCURONIUM BROMIDE 10 MG/ML (PF) SYRINGE
PREFILLED_SYRINGE | INTRAVENOUS | Status: AC
  Filled 2017-11-12: qty 5

## 2017-11-12 MED ORDER — HEMOSTATIC AGENTS (NO CHARGE) OPTIME
TOPICAL | Status: DC | PRN
  Administered 2017-11-12 (×4): 1 via TOPICAL

## 2017-11-12 MED ORDER — ONDANSETRON HCL 4 MG/2ML IJ SOLN
INTRAMUSCULAR | Status: DC | PRN
  Administered 2017-11-12: 4 mg via INTRAVENOUS

## 2017-11-12 MED ORDER — 0.9 % SODIUM CHLORIDE (POUR BTL) OPTIME
TOPICAL | Status: DC | PRN
  Administered 2017-11-12 (×2): 1000 mL

## 2017-11-12 MED ORDER — SODIUM CHLORIDE 0.9 % IR SOLN
Status: DC | PRN
Start: 2017-11-12 — End: 2017-11-12
  Administered 2017-11-12: 1000 mL

## 2017-11-12 MED ORDER — FENTANYL CITRATE (PF) 100 MCG/2ML IJ SOLN
INTRAMUSCULAR | Status: AC
  Filled 2017-11-12: qty 2

## 2017-11-12 SURGICAL SUPPLY — 42 items
APPLIER CLIP 5 13 M/L LIGAMAX5 (MISCELLANEOUS) ×2
BLADE CLIPPER SURG (BLADE) IMPLANT
CANISTER SUCT 3000ML PPV (MISCELLANEOUS) ×2 IMPLANT
CHLORAPREP W/TINT 26ML (MISCELLANEOUS) ×2 IMPLANT
CLIP APPLIE 5 13 M/L LIGAMAX5 (MISCELLANEOUS) ×1 IMPLANT
CLSR STERI-STRIP ANTIMIC 1/2X4 (GAUZE/BANDAGES/DRESSINGS) ×2 IMPLANT
COVER SURGICAL LIGHT HANDLE (MISCELLANEOUS) ×2 IMPLANT
DERMABOND ADVANCED (GAUZE/BANDAGES/DRESSINGS) ×1
DERMABOND ADVANCED .7 DNX12 (GAUZE/BANDAGES/DRESSINGS) ×1 IMPLANT
DEVICE TROCAR PUNCTURE CLOSURE (ENDOMECHANICALS) ×2 IMPLANT
ELECT REM PT RETURN 9FT ADLT (ELECTROSURGICAL) ×2
ELECTRODE REM PT RTRN 9FT ADLT (ELECTROSURGICAL) ×1 IMPLANT
GLOVE BIO SURGEON STRL SZ 6.5 (GLOVE) ×6 IMPLANT
GLOVE BIO SURGEON STRL SZ7 (GLOVE) ×2 IMPLANT
GLOVE BIO SURGEON STRL SZ7.5 (GLOVE) ×2 IMPLANT
GLOVE BIOGEL PI IND STRL 6.5 (GLOVE) ×1 IMPLANT
GLOVE BIOGEL PI IND STRL 7.5 (GLOVE) ×1 IMPLANT
GLOVE BIOGEL PI INDICATOR 6.5 (GLOVE) ×1
GLOVE BIOGEL PI INDICATOR 7.5 (GLOVE) ×1
GOWN STRL REUS W/ TWL LRG LVL3 (GOWN DISPOSABLE) ×3 IMPLANT
GOWN STRL REUS W/TWL LRG LVL3 (GOWN DISPOSABLE) ×3
HEMOSTAT SNOW SURGICEL 2X4 (HEMOSTASIS) ×8 IMPLANT
KIT BASIN OR (CUSTOM PROCEDURE TRAY) ×2 IMPLANT
KIT ROOM TURNOVER OR (KITS) ×2 IMPLANT
NS IRRIG 1000ML POUR BTL (IV SOLUTION) ×2 IMPLANT
PAD ARMBOARD 7.5X6 YLW CONV (MISCELLANEOUS) ×2 IMPLANT
POUCH RETRIEVAL ECOSAC 10 (ENDOMECHANICALS) ×1 IMPLANT
POUCH RETRIEVAL ECOSAC 10MM (ENDOMECHANICALS) ×1
SCISSORS LAP 5X35 DISP (ENDOMECHANICALS) ×2 IMPLANT
SET IRRIG TUBING LAPAROSCOPIC (IRRIGATION / IRRIGATOR) ×2 IMPLANT
SLEEVE ENDOPATH XCEL 5M (ENDOMECHANICALS) ×4 IMPLANT
SPECIMEN JAR SMALL (MISCELLANEOUS) ×2 IMPLANT
STRIP CLOSURE SKIN 1/2X4 (GAUZE/BANDAGES/DRESSINGS) ×2 IMPLANT
SUT MNCRL AB 4-0 PS2 18 (SUTURE) ×4 IMPLANT
SUT VICRYL 0 UR6 27IN ABS (SUTURE) ×4 IMPLANT
TOWEL OR 17X24 6PK STRL BLUE (TOWEL DISPOSABLE) ×2 IMPLANT
TOWEL OR 17X26 10 PK STRL BLUE (TOWEL DISPOSABLE) ×2 IMPLANT
TRAY CATH 16FR W/PLASTIC CATH (SET/KITS/TRAYS/PACK) ×2 IMPLANT
TRAY LAPAROSCOPIC MC (CUSTOM PROCEDURE TRAY) ×2 IMPLANT
TROCAR XCEL BLUNT TIP 100MML (ENDOMECHANICALS) ×2 IMPLANT
TROCAR XCEL NON-BLD 5MMX100MML (ENDOMECHANICALS) ×2 IMPLANT
TUBING INSUFFLATION (TUBING) ×2 IMPLANT

## 2017-11-12 NOTE — ED Notes (Signed)
Patient complained of chest pain 10/10 pressure Doctor notified ordered EKG at this time.

## 2017-11-12 NOTE — H&P (Signed)
[facility] Admission Note  Amy Lloyd 07-14-1986  [id number].    Requesting MD: Ashok Cordia, MD Chief Complaint/Reason for Consult: RUQ pain, cholelithiasis   HPI:  31 y/o spanish speaking female who presents to [facility] emergency department with 2 days of upper abdominal pain. Patient states her pain started on 11/25 in the afternoon after eating a meal. Described as right upper quadrant pain with some radiation to her back and her chest. Has experienced similar pain in the past and been diagnosed with gallstone, however this episode is worse than previous episodes. Associated symptoms include nausea and vomiting. Patient denies fever, chills, changes in bowel habits. Patient has no known drug allergies. Patient is a nonsmoker and denies illicit drug use. Denies daily medication use. Past abdominal surgeries include laparoscopic appendectomy which she tolerated without consultations.  Workup in the ED showed normal vital signs, no leukocytosis, normal LFTs and lipase, right upper quadrant ultrasound significant for cholelithiasis with a 2.7 x 4.3 cm gallstone in the gallbladder neck. Also significant for mild gallbladder wall thickening, pericholecystic fluid, and positive sonographic Murphy sign. General surgery was asked to consult for possible cholecystectomy.  ROS: Review of Systems  Constitutional: Negative for chills and fever.  Respiratory: Negative for cough and shortness of breath.   Cardiovascular: Negative for chest pain.  Gastrointestinal: Positive for abdominal pain, nausea and vomiting. Negative for blood in stool, constipation and diarrhea.  Genitourinary: Negative.   All other systems reviewed and are negative.  No family history on file.  Past Medical History:  Diagnosis Date  . Gallbladder attack     Past Surgical History:  Procedure Laterality Date  . APPENDECTOMY  09/26/2015   "lap"  . LAPAROSCOPIC APPENDECTOMY N/A 09/26/2015   Procedure:  APPENDECTOMY LAPAROSCOPIC;  Surgeon: Rolm Bookbinder, MD;  Location: [facility];  Service: General;  Laterality: N/A;    Social History:  reports that  has never smoked. she has never used smokeless tobacco. She reports that she does not drink alcohol or use drugs.  Allergies: No Known Allergies   (Not in a hospital admission)  Blood pressure 103/80, pulse 73, temperature 98.5 F (36.9 C), temperature source Oral, resp. rate 15, SpO2 100 %. Physical Exam: Physical Exam  Constitutional: She is oriented to person, place, and time. She appears well-developed and well-nourished.  Non-toxic appearance. She does not appear ill. No distress.  HENT:  Head: Normocephalic and atraumatic.  Mouth/Throat: Oropharynx is clear and moist.  Eyes: EOM are normal. Pupils are equal, round, and reactive to light. No scleral icterus.  Cardiovascular: Normal rate, regular rhythm, normal heart sounds and intact distal pulses. Exam reveals no gallop and no friction rub.  No murmur heard. Pulmonary/Chest: Effort normal and breath sounds normal. No respiratory distress. She has no wheezes. She has no rhonchi. She has no rales. She exhibits no tenderness.  Abdominal: Soft. Normal appearance and bowel sounds are normal. There is tenderness in the right upper quadrant and epigastric area. There is no rebound, no guarding and negative Murphy's sign.  Neurological: She is alert and oriented to person, place, and time.  Skin: Skin is warm and dry. No rash noted. No erythema. No pallor.  Psychiatric: She has a normal mood and affect. Her behavior is normal.    Results for orders placed or performed during the hospital encounter of 11/12/17 (from the past 48 hour(s))  I-Stat beta hCG blood, ED     Status: None   Collection Time: 11/12/17  3:07 AM  Result  Value Ref Range   I-stat hCG, quantitative <5.0 <5 mIU/mL   Comment 3            Comment:   GEST. AGE      CONC.  (mIU/mL)   <=1 WEEK        5 - 50     2 WEEKS        50 - 500     3 WEEKS       100 - 10,000     4 WEEKS     1,000 - 30,000        FEMALE AND NON-PREGNANT FEMALE:     LESS THAN 5 mIU/mL   Lipase, blood     Status: None   Collection Time: 11/12/17  3:17 AM  Result Value Ref Range   Lipase 28 11 - 51 U/L  Comprehensive metabolic panel     Status: Abnormal   Collection Time: 11/12/17  3:17 AM  Result Value Ref Range   Sodium 138 135 - 145 mmol/L   Potassium 3.3 (L) 3.5 - 5.1 mmol/L   Chloride 106 101 - 111 mmol/L   CO2 25 22 - 32 mmol/L   Glucose, Bld 115 (H) 65 - 99 mg/dL   BUN 10 6 - 20 mg/dL   Creatinine, Ser 0.77 0.44 - 1.00 mg/dL   Calcium 8.7 (L) 8.9 - 10.3 mg/dL   Total Protein 6.7 6.5 - 8.1 g/dL   Albumin 3.5 3.5 - 5.0 g/dL   AST 19 15 - 41 U/L   ALT 14 14 - 54 U/L   Alkaline Phosphatase 79 38 - 126 U/L   Total Bilirubin 0.3 0.3 - 1.2 mg/dL   GFR calc non Af Amer >60 >60 mL/min   GFR calc Af Amer >60 >60 mL/min    Comment: (NOTE) The eGFR has been calculated using the CKD EPI equation. This calculation has not been validated in all clinical situations. eGFR's persistently <60 mL/min signify possible Chronic Kidney Disease.    Anion gap 7 5 - 15  CBC     Status: None   Collection Time: 11/12/17  3:17 AM  Result Value Ref Range   WBC 6.7 4.0 - 10.5 K/uL   RBC 4.47 3.87 - 5.11 MIL/uL   Hemoglobin 13.6 12.0 - 15.0 g/dL   HCT 39.0 36.0 - 46.0 %   MCV 87.2 78.0 - 100.0 fL   MCH 30.4 26.0 - 34.0 pg   MCHC 34.9 30.0 - 36.0 g/dL   RDW 12.0 11.5 - 15.5 %   Platelets 185 150 - 400 K/uL   US Abdomen Complete  Result Date: 11/12/2017 CLINICAL DATA:  Right upper quadrant pain with nausea and vomiting for 3 days. EXAM: ABDOMEN ULTRASOUND COMPLETE COMPARISON:  Abdominal ultrasound 07/29/2016. CT abdomen and pelvis 09/25/2015. FINDINGS: Gallbladder: The gallbladder wall is mildly thickened at 0.5 cm and there is a small volume of pericholecystic fluid. A gallstone measuring approximately 2.7 by 4.3 cm is impacted in the  gallbladder neck. Sonographer reports positive Murphy's sign. Common bile duct: Diameter: 0.4 cm Liver: No focal lesion identified. Within normal limits in parenchymal echogenicity. Portal vein is patent on color Doppler imaging with normal direction of blood flow towards the liver. IVC: No abnormality visualized. Pancreas: Visualized portion unremarkable. Spleen: Size and appearance within normal limits. Right Kidney: Length: 10.3 cm. Echogenicity within normal limits. No mass or hydronephrosis visualized. Left Kidney: Length: 10.9 cm. Echogenicity within normal limits. No mass or hydronephrosis visualized. Abdominal  aorta: No aneurysm visualized. Other findings: None. IMPRESSION: Findings most consistent with acute cholecystitis with a 2.7 x 4.3 cm stone impacted in the gallbladder neck. Electronically Signed   By: Inge Rise M.D.   On: 11/12/2017 11:17   Assessment/Plan Worsening symptomatic cholelithiasis, possible early acute cholecystitis  - afebrile, VSS, LFTs and WBC WNL - RUQ U/S w/ large gallstone impacted in gallbladder neck and surrounding inflammatory changes consistent with early acute cholecystitis. Positive sonographic murphy's. - recommend NPO, IVF, IV abx, and OR for laparoscopic cholecystectomy today by Dr. Donne Hazel.  - Will admit to [facility] service for observation post-operatively.  FEN: NPO, IVF ID: Rocephin perioperatively VTE: SCD's, hold chemical VTE for surgery    Jill Alexanders, [facility] 11/12/2017, 12:47 PM Pager: [phone] Consults: [phone] Mon-Fri 7:00 am-4:30 pm Sat-Sun 7:00 am-11:30 am

## 2017-11-12 NOTE — ED Triage Notes (Signed)
Pt c/o right upper abdominal pain that started yesterday and got worse today. Pt is unable to sit still in triage. Denies anything that makes it better or worse. She endorses nausea and vomiting. Pt states she was told she had kidney stones.

## 2017-11-12 NOTE — Anesthesia Procedure Notes (Signed)
Procedure Name: Intubation Date/Time: 11/12/2017 4:03 PM Performed by: Shirlyn Goltz, CRNA Pre-anesthesia Checklist: Patient identified, Emergency Drugs available, Patient being monitored and Suction available Patient Re-evaluated:Patient Re-evaluated prior to induction Oxygen Delivery Method: Circle system utilized Preoxygenation: Pre-oxygenation with 100% oxygen Induction Type: IV induction Ventilation: Mask ventilation without difficulty Laryngoscope Size: Mac and 3 Grade View: Grade I Tube type: Oral Tube size: 7.0 mm Number of attempts: 1 Airway Equipment and Method: Stylet Placement Confirmation: ETT inserted through vocal cords under direct vision,  positive ETCO2 and breath sounds checked- equal and bilateral Secured at: 21 cm Tube secured with: Tape Dental Injury: Teeth and Oropharynx as per pre-operative assessment

## 2017-11-12 NOTE — ED Notes (Signed)
EKG completed given to EDP.  

## 2017-11-12 NOTE — Op Note (Signed)
Preoperative diagnosis: acute cholecystitis Postoperative diagnosis: same as above Procedure: Laparoscopic cholecystectomy Surgeon: Dr. Harden MoMatt Blaize Lloyd Asst: Dr Carman Chingoug Blackman Anesthesia: Gen. Specimens: gb to pathology Estimated blood loss: minimal Complications: None Drains: none Sponge count was correct at completion Disposition to recovery stable  Indications: This is a [age] yof who has known gallstones.  She has cholecystitis now and I discussed a lap chole today.   Procedure: After informed consent was obtained the patient was taken to the operating room. She was givenantibiotics. SCDs were in place. She was placed undergeneral anesthesia without complication. Her abdomen was prepped and draped in the standard sterile surgical fashion. A surgical timeout was then performed.  I infiltrated marcaine below the umbilicus.  I reentered the old incision and identified the fascia. I entered the fascia and incised it. I entered the peritoneum bluntly.  I placed a 0 vicryl pursestring suture. I inserted a hasson trocar and insufflated the to 15 mm Hg pressure.   I then placed a 5 mm trocar in theepigastrium.Two5 mm trocars were placed in the right side of the abdomen. The gallbladder was difficult to manipulate due to the fatty liver.  I did aspirate the gallbladder as it was tense.I grasped the gallbladder and retracted cephalad and lateral. Dr Magnus IvanBlackman was necessary for this portion of the operation.  there was a large stone at the neck that made this difficult.  EventuallyI was able to identify the critical view of safety. I then clipped the duct and divided it. The duct was viable. The clips traversed the duct. I then clipped and divided the cystic artery. I then removed the gallbladder from the liver bed. The gallbladder was stuck and the liver very friable.  The liver was bleeding diffusely upon completion.  I spent some time cauterizing the liver bed and placed several pieces of  snow.  I placed the gallbladderin a bag and removed from the umbilicus. I had to enlarge my incision to get the large stones out.  I then removed the hasson trocar, tied down the pursestring and closed this with multiple2-0 vicrylsusing the endoclose device.I then removed the remaining trocars and these were closed with 4-0 Monocryl and glue. She tolerated this well be transferred to the recovery room.

## 2017-11-12 NOTE — Progress Notes (Signed)
Amy Lloyd is a 31 y.o. female patient admitted from PACU awake, alert - oriented  X 4 - C/o pain in incision sites, 4 lap sites with steri strips on, clean and dry.  VSS - Blood pressure 135/83, pulse 74, temperature 98.5 F (36.9 C), temperature source Oral, resp. rate 19, height 5' (1.524 m), weight 72.5 kg (159 lb 13.3 oz), SpO2 100 %.    IV in place, occlusive dsg intact without redness.    Will cont to eval and treat per MD orders.  Rolland PorterJosephine M Colin Ellers, RN 11/12/2017 10:57 PM

## 2017-11-12 NOTE — ED Provider Notes (Signed)
[facility] EMERGENCY DEPARTMENT Provider Note   CSN: [id number] Arrival date & time: 11/12/17  0214     History   Chief Complaint Chief Complaint  Patient presents with  . Abdominal Pain    HPI Amy Lloyd is a 31 y.o. female.  Patient c/o ruq/r flank pain for the past 2 days. Pain acute onset, constant, dull, moderate-severe, radiates to back. Has had same pain in past. Hx gallstones and kidney stones. +nausea and vomiting. Emesis not bloody or bilious. No fever or chills. Is having normal bms. No dysuria or hematuria.    The history is provided by the patient.  Abdominal Pain   Associated symptoms include nausea and vomiting. Pertinent negatives include fever and headaches.    Past Medical History:  Diagnosis Date  . Gallbladder attack     Patient Active Problem List   Diagnosis Date Noted  . Appendicitis, acute, with peritonitis 09/25/2015    Past Surgical History:  Procedure Laterality Date  . APPENDECTOMY  09/26/2015   "lap"  . LAPAROSCOPIC APPENDECTOMY N/A 09/26/2015   Procedure: APPENDECTOMY LAPAROSCOPIC;  Surgeon: Emelia LoronMatthew Wakefield, MD;  Location: [facility] OR;  Service: General;  Laterality: N/A;    OB History    Gravida Para Term Preterm AB Living   2 2 2     2    SAB TAB Ectopic Multiple Live Births           1       Home Medications    Prior to Admission medications   Medication Sig Start Date End Date Taking? Authorizing Provider  cephALEXin (KEFLEX) 500 MG capsule Take 1 capsule (500 mg total) by mouth 4 (four) times daily. Patient not taking: Reported on 04/03/2017 07/29/16   Cathren LaineSteinl, Breeley Bischof, MD  cyclobenzaprine (FLEXERIL) 10 MG tablet Take 1 tablet (10 mg total) by mouth 2 (two) times daily as needed for muscle spasms. 04/03/17   Elpidio AnisUpstill, Shari, PA-C  cyclobenzaprine (FLEXERIL) 10 MG tablet Take 1 tablet (10 mg total) by mouth 2 (two) times daily as needed for muscle spasms. 04/16/17   Mackuen, Courteney Lyn, MD    HYDROcodone-acetaminophen (NORCO/VICODIN) 5-325 MG tablet Take 1-2 tablets by mouth every 6 (six) hours as needed for moderate pain. Patient not taking: Reported on 04/03/2017 07/29/16   Cathren LaineSteinl, Zach Tietje, MD  ibuprofen (ADVIL,MOTRIN) 600 MG tablet Take 1 tablet (600 mg total) by mouth every 6 (six) hours as needed. 04/03/17   Elpidio AnisUpstill, Shari, PA-C  ibuprofen (CHILDRENS IBUPROFEN 100) 100 MG/5ML suspension Take 36.3 mLs (726 mg total) by mouth every 6 (six) hours as needed. 04/16/17   Mackuen, Cindee Saltourteney Lyn, MD    Family History No family history on file.  Social History Social History   Tobacco Use  . Smoking status: Never Smoker  . Smokeless tobacco: Never Used  Substance Use Topics  . Alcohol use: No  . Drug use: No     Allergies   Patient has no known allergies.   Review of Systems Review of Systems  Constitutional: Negative for fever.  HENT: Negative for sore throat.   Eyes: Negative for redness.  Respiratory: Negative for cough and shortness of breath.   Cardiovascular: Negative for chest pain.  Gastrointestinal: Positive for abdominal pain, nausea and vomiting.  Genitourinary: Positive for flank pain.  Musculoskeletal: Negative for neck pain.  Skin: Negative for rash.  Neurological: Negative for headaches.  Hematological: Does not bruise/bleed easily.  Psychiatric/Behavioral: Negative for confusion.     Physical Exam Updated Vital Signs  BP 128/64 (BP Location: Left Arm)   Pulse 67   Temp 98.5 F (36.9 C) (Oral)   Resp 18   SpO2 100%   Physical Exam  Constitutional: She appears well-developed and well-nourished. No distress.  HENT:  Head: Atraumatic.  Eyes: Conjunctivae are normal. No scleral icterus.  Neck: Neck supple. No tracheal deviation present.  Cardiovascular: Normal rate, regular rhythm, normal heart sounds and intact distal pulses. Exam reveals no gallop and no friction rub.  No murmur heard. Pulmonary/Chest: Effort normal and breath sounds normal.  No respiratory distress.  Abdominal: Soft. Normal appearance and bowel sounds are normal. She exhibits no distension and no mass. There is tenderness. There is no rebound and no guarding. No hernia.  Mod ruq tenderness.   Genitourinary:  Genitourinary Comments: No cva tenderness  Musculoskeletal: She exhibits no edema.  Neurological: She is alert.  Skin: Skin is warm and dry. No rash noted.  Psychiatric: She has a normal mood and affect.  Nursing note and vitals reviewed.    ED Treatments / Results  Labs (all labs ordered are listed, but only abnormal results are displayed) Results for orders placed or performed during the hospital encounter of 11/12/17  Lipase, blood  Result Value Ref Range   Lipase 28 11 - 51 U/L  Comprehensive metabolic panel  Result Value Ref Range   Sodium 138 135 - 145 mmol/L   Potassium 3.3 (L) 3.5 - 5.1 mmol/L   Chloride 106 101 - 111 mmol/L   CO2 25 22 - 32 mmol/L   Glucose, Bld 115 (H) 65 - 99 mg/dL   BUN 10 6 - 20 mg/dL   Creatinine, Ser 1.91 0.44 - 1.00 mg/dL   Calcium 8.7 (L) 8.9 - 10.3 mg/dL   Total Protein 6.7 6.5 - 8.1 g/dL   Albumin 3.5 3.5 - 5.0 g/dL   AST 19 15 - 41 U/L   ALT 14 14 - 54 U/L   Alkaline Phosphatase 79 38 - 126 U/L   Total Bilirubin 0.3 0.3 - 1.2 mg/dL   GFR calc non Af Amer >60 >60 mL/min   GFR calc Af Amer >60 >60 mL/min   Anion gap 7 5 - 15  CBC  Result Value Ref Range   WBC 6.7 4.0 - 10.5 K/uL   RBC 4.47 3.87 - 5.11 MIL/uL   Hemoglobin 13.6 12.0 - 15.0 g/dL   HCT 47.8 29.5 - 62.1 %   MCV 87.2 78.0 - 100.0 fL   MCH 30.4 26.0 - 34.0 pg   MCHC 34.9 30.0 - 36.0 g/dL   RDW 30.8 65.7 - 84.6 %   Platelets 185 150 - 400 K/uL  I-Stat beta hCG blood, ED  Result Value Ref Range   I-stat hCG, quantitative <5.0 <5 mIU/mL   Comment 3           No results found.  EKG  EKG Interpretation None       Radiology US Abdomen Complete  Result Date: 11/12/2017 CLINICAL DATA:  Right upper quadrant pain with nausea and  vomiting for 3 days. EXAM: ABDOMEN ULTRASOUND COMPLETE COMPARISON:  Abdominal ultrasound 07/29/2016. CT abdomen and pelvis 09/25/2015. FINDINGS: Gallbladder: The gallbladder wall is mildly thickened at 0.5 cm and there is a small volume of pericholecystic fluid. A gallstone measuring approximately 2.7 by 4.3 cm is impacted in the gallbladder neck. Sonographer reports positive Murphy's sign. Common bile duct: Diameter: 0.4 cm Liver: No focal lesion identified. Within normal limits in parenchymal echogenicity. Portal vein  is patent on color Doppler imaging with normal direction of blood flow towards the liver. IVC: No abnormality visualized. Pancreas: Visualized portion unremarkable. Spleen: Size and appearance within normal limits. Right Kidney: Length: 10.3 cm. Echogenicity within normal limits. No mass or hydronephrosis visualized. Left Kidney: Length: 10.9 cm. Echogenicity within normal limits. No mass or hydronephrosis visualized. Abdominal aorta: No aneurysm visualized. Other findings: None. IMPRESSION: Findings most consistent with acute cholecystitis with a 2.7 x 4.3 cm stone impacted in the gallbladder neck. Electronically Signed   By: Drusilla Kannerhomas  Dalessio M.D.   On: 11/12/2017 11:17    Procedures Procedures (including critical care time)  Medications Ordered in ED Medications  fentaNYL (SUBLIMAZE) injection 50 mcg (50 mcg Intravenous Given 11/12/17 0318)  sodium chloride 0.9 % bolus 1,000 mL (not administered)  HYDROmorphone (DILAUDID) injection 1 mg (not administered)  ondansetron (ZOFRAN) injection 4 mg (not administered)  ondansetron (ZOFRAN) injection 4 mg (4 mg Intravenous Given 11/12/17 0318)     Initial Impression / Assessment and Plan / ED Course  I have reviewed the triage vital signs and the nursing notes.  Pertinent labs & imaging results that were available during my care of the patient were reviewed by me and considered in my medical decision making (see chart for details).  Iv  ns bolus. Dilaudid 1 mg iv. zofran iv.   Labs sent.  Persistent pain. Will get u/s.   Reviewed nursing notes and prior charts for additional history.   Pain improved from earlier, but persists.  Will consult general surgery re gallstones and possible cholecystitis.      Final Clinical Impressions(s) / ED Diagnoses   Final diagnoses:  None    ED Discharge Orders    None       Cathren LaineSteinl, Raijon Lindfors, MD 11/12/17 1203

## 2017-11-12 NOTE — Transfer of Care (Signed)
Immediate Anesthesia Transfer of Care Note  Patient: Amy Lloyd  Procedure(s) Performed: LAPAROSCOPIC CHOLECYSTECTOMY (N/A )  Patient Location: PACU  Anesthesia Type:General  Level of Consciousness: awake, alert , oriented and patient cooperative  Airway & Oxygen Therapy: Patient Spontanous Breathing and Patient connected to nasal cannula oxygen  Post-op Assessment: Report given to RN and Post -op Vital signs reviewed and stable  Post vital signs: Reviewed and stable  Last Vitals:  Vitals:   11/12/17 1430 11/12/17 1753  BP: 105/67   Pulse: 64   Resp: (!) 22   Temp:  36.6 C  SpO2: 100%     Last Pain:  Vitals:   11/12/17 1246  TempSrc:   PainSc: 5          Complications: No apparent anesthesia complications

## 2017-11-12 NOTE — Anesthesia Preprocedure Evaluation (Signed)
Anesthesia Evaluation  Patient identified by MRN, date of birth, ID band Patient awake    Reviewed: Allergy & Precautions, NPO status , Patient's Chart, lab work & pertinent test results  Airway Mallampati: I   Neck ROM: full    Dental   Pulmonary neg pulmonary ROS,    breath sounds clear to auscultation       Cardiovascular negative cardio ROS   Rhythm:regular Rate:Normal  ECG: SB, rate 54   Neuro/Psych negative neurological ROS  negative psych ROS   GI/Hepatic negative GI ROS, Neg liver ROS, Cholecystitis   Endo/Other  negative endocrine ROS  Renal/GU negative Renal ROS     Musculoskeletal negative musculoskeletal ROS (+)   Abdominal   Peds  Hematology negative hematology ROS (+)   Anesthesia Other Findings   Reproductive/Obstetrics                             Anesthesia Physical  Anesthesia Plan  ASA: I  Anesthesia Plan: General   Post-op Pain Management:    Induction: Intravenous  PONV Risk Score and Plan: 4 or greater and Ondansetron, Dexamethasone and Midazolam  Airway Management Planned: Oral ETT  Additional Equipment:   Intra-op Plan:   Post-operative Plan: Extubation in OR  Informed Consent: I have reviewed the patients History and Physical, chart, labs and discussed the procedure including the risks, benefits and alternatives for the proposed anesthesia with the patient or authorized representative who has indicated his/her understanding and acceptance.   Dental advisory given  Plan Discussed with: CRNA  Anesthesia Plan Comments:         Anesthesia Quick Evaluation

## 2017-11-13 ENCOUNTER — Encounter (HOSPITAL_COMMUNITY): Payer: Self-pay | Admitting: General Surgery

## 2017-11-13 LAB — COMPREHENSIVE METABOLIC PANEL
ALBUMIN: 2.9 g/dL — AB (ref 3.5–5.0)
ALT: 156 U/L — AB (ref 14–54)
AST: 121 U/L — AB (ref 15–41)
Alkaline Phosphatase: 80 U/L (ref 38–126)
Anion gap: 5 (ref 5–15)
BUN: 6 mg/dL (ref 6–20)
CHLORIDE: 107 mmol/L (ref 101–111)
CO2: 24 mmol/L (ref 22–32)
CREATININE: 0.59 mg/dL (ref 0.44–1.00)
Calcium: 8.6 mg/dL — ABNORMAL LOW (ref 8.9–10.3)
GFR calc Af Amer: >60 mL/min (ref >60)
GLUCOSE: 109 mg/dL — AB (ref 65–99)
Potassium: 3.8 mmol/L (ref 3.5–5.1)
Sodium: 136 mmol/L (ref 135–145)
Total Bilirubin: 0.8 mg/dL (ref 0.3–1.2)
Total Protein: 6.1 g/dL — ABNORMAL LOW (ref 6.5–8.1)

## 2017-11-13 LAB — CBC
HCT: 35.8 % — ABNORMAL LOW (ref 36.0–46.0)
Hemoglobin: 12.4 g/dL (ref 12.0–15.0)
MCH: 30 pg (ref 26.0–34.0)
MCHC: 34.6 g/dL (ref 30.0–36.0)
MCV: 86.7 fL (ref 78.0–100.0)
PLATELETS: 177 10*3/uL (ref 150–400)
RBC: 4.13 MIL/uL (ref 3.87–5.11)
RDW: 12 % (ref 11.5–15.5)
WBC: 9.7 10*3/uL (ref 4.0–10.5)

## 2017-11-13 LAB — HIV ANTIBODY (ROUTINE TESTING W REFLEX): HIV SCREEN 4TH GENERATION: NONREACTIVE

## 2017-11-13 MED ORDER — OXYCODONE HCL 5 MG PO TABS: 5.0000 mg | ORAL_TABLET | ORAL | 0 refills | Status: DC | PRN

## 2017-11-13 MED ORDER — ACETAMINOPHEN 325 MG PO TABS: 650.0000 mg | ORAL_TABLET | Freq: Four times a day (QID) | ORAL | Status: DC | PRN

## 2017-11-13 NOTE — Progress Notes (Signed)
Discharge instructions reviewed with pt and pt's family, using family as interpreter per pt's request and prescriptions given.  Pt verbalized understanding and had no questions.  Pt discharged in stable condition with family.  Hector ShadeMoss, Armina Galloway WabenoLindsay

## 2017-11-13 NOTE — Discharge Instructions (Signed)

## 2017-11-13 NOTE — Discharge Summary (Signed)
[facility] Discharge Summary   Patient ID: Amy Lloyd MRN: [id number] DOB/AGE: 08/06/1986 31 y.o.  Admit date: 11/12/2017 Discharge date: 11/13/2017   Discharge Diagnosis Patient Active Problem List   Diagnosis Date Noted  . Cholecystitis 11/12/2017  .     Imaging: [place] Abdomen Complete  Result Date: 11/12/2017 CLINICAL DATA:  Right upper quadrant pain with nausea and vomiting for 3 days. EXAM: ABDOMEN ULTRASOUND COMPLETE COMPARISON:  Abdominal ultrasound 07/29/2016. CT abdomen and pelvis 09/25/2015. FINDINGS: Gallbladder: The gallbladder wall is mildly thickened at 0.5 cm and there is a small volume of pericholecystic fluid. A gallstone measuring approximately 2.7 by 4.3 cm is impacted in the gallbladder neck. Sonographer reports positive Murphy's sign. Common bile duct: Diameter: 0.4 cm Liver: No focal lesion identified. Within normal limits in parenchymal echogenicity. Portal vein is patent on color Doppler imaging with normal direction of blood flow towards the liver. IVC: No abnormality visualized. Pancreas: Visualized portion unremarkable. Spleen: Size and appearance within normal limits. Right Kidney: Length: 10.3 cm. Echogenicity within normal limits. No mass or hydronephrosis visualized. Left Kidney: Length: 10.9 cm. Echogenicity within normal limits. No mass or hydronephrosis visualized. Abdominal aorta: No aneurysm visualized. Other findings: None. IMPRESSION: Findings most consistent with acute cholecystitis with a 2.7 x 4.3 cm stone impacted in the gallbladder neck. Electronically Signed   By: Drusilla Kannerhomas  Dalessio M.D.   On: 11/12/2017 11:17    Procedures Dr. Emelia LoronMatthew Wakefield (11/12/17) - Laparoscopic Cholecystectomy  Hospital Course:  31 y/o spanish speaking female [facility] onset 10/10 epigastric pain and a history of worsening biliary colic.  Workup showed above RUQ U/S consistent with cholecystitis.  Patient was admitted and  underwent procedure listed above.  Tolerated procedure well and was transferred to the floor.  Diet was advanced as tolerated.  On POD#1, the patient was voiding well, tolerating diet, ambulating well, pain well controlled, vital signs stable, incisions c/d/i and felt stable for discharge home.  Patient will follow up in our office in 2-3 weeks and knows to call with questions or concerns.  She will call to confirm appointment date/time. Work note provided.  Physical Exam: General:  Alert, NAD, pleasant, comfortable Pulm: normal effort Abd:  Soft, ND, appropriately tender, incisions C/D/I without drainage or erythema.  Allergies as of 11/13/2017   No Known Allergies     Medication List    STOP taking these medications   cephALEXin 500 MG capsule Commonly known as:  KEFLEX   cyclobenzaprine 10 MG tablet Commonly known as:  FLEXERIL   HYDROcodone-acetaminophen 5-325 MG tablet Commonly known as:  NORCO/VICODIN     TAKE these medications   acetaminophen 325 MG tablet Commonly known as:  TYLENOL Take 2 tablets (650 mg total) by mouth every 6 (six) hours as needed for mild pain (or temp > 100).   ibuprofen 600 MG tablet Commonly known as:  ADVIL,MOTRIN Take 1 tablet (600 mg total) by mouth every 6 (six) hours as needed. What changed:    how much to take  reasons to take this  Another medication with the same name was removed. Continue taking this medication, and follow the directions you see here.   oxyCODONE 5 MG immediate release tablet Commonly known as:  Oxy IR/ROXICODONE Take 1 tablet (5 mg total) by mouth every 4 (four) hours as needed for moderate pain.        Follow-up Information    [facility]   Specialty:  General  Surgery Why:  our office is scheduling you for post-operative follow up appointment. call to confirm appointment date/time.  Contact information: [address] [place]  [id number] [phone]          Signed: Hosie SpangleElizabeth Deara Bober, [facility] 11/13/2017, 10:57 AM Pager: [phone] Consults: [phone] Mon-Fri 7:00 am-4:30 pm Sat-Sun 7:00 am-11:30 am

## 2017-11-15 NOTE — Anesthesia Postprocedure Evaluation (Signed)
Anesthesia Post Note  Patient: Kambrie Ramirez-Velazquez  Procedure(s) Performed: LAPAROSCOPIC CHOLECYSTECTOMY (N/A )     Patient location during evaluation: PACU Anesthesia Type: General Level of consciousness: awake and alert Pain management: pain level controlled Vital Signs Assessment: post-procedure vital signs reviewed and stable Respiratory status: spontaneous breathing, nonlabored ventilation, respiratory function stable and patient connected to nasal cannula oxygen Cardiovascular status: blood pressure returned to baseline and stable Postop Assessment: no apparent nausea or vomiting Anesthetic complications: no    Last Vitals:  Vitals:   11/13/17 0535 11/13/17 1154  BP: 111/74 113/76  Pulse: 78 78  Resp: 16   Temp: 36.9 C 37.1 C  SpO2: 99% 100%    Last Pain:  Vitals:   11/13/17 1445  TempSrc:   PainSc: 6                  Shermika Balthaser

## 2018-01-09 ENCOUNTER — Ambulatory Visit (HOSPITAL_COMMUNITY)
Admission: EM | Admit: 2018-01-09 | Discharge: 2018-01-09 | Disposition: A | Discharge status: Home / Self Care | Payer: Self-pay | Attending: Physician Assistant | Admitting: Physician Assistant

## 2018-01-09 ENCOUNTER — Encounter (HOSPITAL_COMMUNITY): Payer: Self-pay | Admitting: Emergency Medicine

## 2018-01-09 DIAGNOSIS — M25562 Pain in left knee: Secondary | ICD-10-CM

## 2018-01-09 MED ORDER — IBUPROFEN 600 MG PO TABS: 600.0000 mg | ORAL_TABLET | Freq: Four times a day (QID) | ORAL | 0 refills | Status: DC | PRN

## 2018-01-09 NOTE — Discharge Instructions (Signed)
Take the medication as prescribed every 8 hours for at least 4-5 days.  If the pain continues it would be wise to return here or go to your primary care provider to consider getting a knee x-ray.  Consider icing the knee.

## 2018-01-09 NOTE — ED Provider Notes (Addendum)
01/09/2018 6:43 PM   DOB: [birth year] / MRN: [id number]  SUBJECTIVE:  Amy Lloyd is a 32 y.o. female left knee pain that is worse with ambulation.  The pain is mild to moderate.  She denies swelling, warmth, rash.  Has tried Tylenol 1000 mg with some relief of pain.  She has No Known Allergies.   She  has a past medical history of Gallbladder attack.    She  reports that  has never smoked. she has never used smokeless tobacco. She reports that she does not drink alcohol or use drugs. She  reports that she currently engages in sexual activity. The patient  has a past surgical history that includes Appendectomy (09/26/2015); laparoscopic appendectomy (N/A, 09/26/2015); and Cholecystectomy (N/A, 11/12/2017).  Her family history is not on file.  Review of Systems  Constitutional: Negative for chills, diaphoresis and fever.  Gastrointestinal: Negative for nausea.  Musculoskeletal: Positive for joint pain. Negative for falls.  Skin: Negative for rash.  Neurological: Negative for dizziness.    OBJECTIVE:  BP 116/73   Pulse 63   Temp 98.4 F (36.9 C) (Oral)   Resp 16   Wt 159 lb (72.1 kg)   LMP 12/29/2017   SpO2 100%   BMI 31.05 kg/m   Wt Readings from Last 3 Encounters:  01/09/18 159 lb (72.1 kg)  11/12/17 159 lb 13.3 oz (72.5 kg)  04/03/17 160 lb (72.6 kg)   Temp Readings from Last 3 Encounters:  01/09/18 98.4 F (36.9 C) (Oral)  11/13/17 98.7 F (37.1 C) (Oral)  04/16/17 99 F (37.2 C) (Oral)   BP Readings from Last 3 Encounters:  01/09/18 116/73  11/13/17 113/76  04/16/17 107/68   Pulse Readings from Last 3 Encounters:  01/09/18 63  11/13/17 78  04/16/17 70     Physical Exam  Constitutional: She is active.  Non-toxic appearance.  Cardiovascular: Normal rate.  Pulmonary/Chest: Effort normal. No tachypnea.  Musculoskeletal: Normal range of motion. She exhibits no edema, tenderness or deformity.  The left knee appears normal.  Ligaments intact to  challenge.  No swelling about the knee.  Negative patellar grind.  Negative McMurray's.  Negative joint line tenderness.  Neurological: She is alert.  Skin: Skin is warm and dry. She is not diaphoretic. No pallor.    No results found for this or any previous visit (from the past 72 hour(s)).  No results found.  ASSESSMENT AND PLAN:  No orders of the defined types were placed in this encounter.    Acute pain of left knee : Her exam is excellent tonight.  No trauma preceding this pain.  We will treat with an anti-inflammatory and advised that she return here or seek the care of her primary care provider for imaging.      The patient is advised to call or return to clinic if she does not see an improvement in symptoms, or to seek the care of the closest emergency department if she worsens with the above plan.   Deliah BostonMichael Wilburn Keir, MHS, PA-C 01/09/2018 6:43 PM    Ofilia Neaslark, Laylana Gerwig L, PA-C 01/09/18 1843    Ofilia Neaslark, Nashira Mcglynn L, PA-C 01/09/18 1844

## 2018-01-09 NOTE — ED Triage Notes (Signed)
PT reports left knee pain for 1 month. No known injury.
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
## 2018-04-17 IMAGING — US US ABDOMEN COMPLETE
1 series · 13 of 25 positions shown · non-contrast
Comparison: Prior CT from 09/25/2015.

CLINICAL DATA: Initial evaluation for acute right upper quadrant
pain.

EXAM:
ABDOMEN ULTRASOUND COMPLETE

[Series 1: us abdomen complete · 0.25mm/px · 13 of 89 slices shown]
[im 1/89]
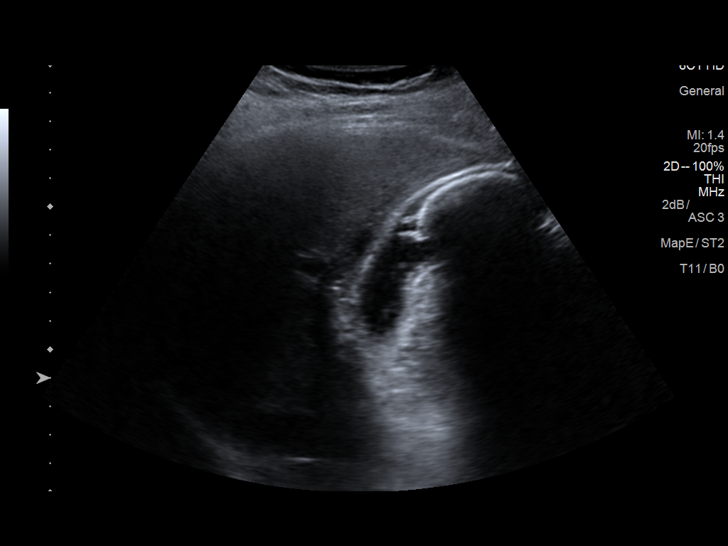
[im 8/89]
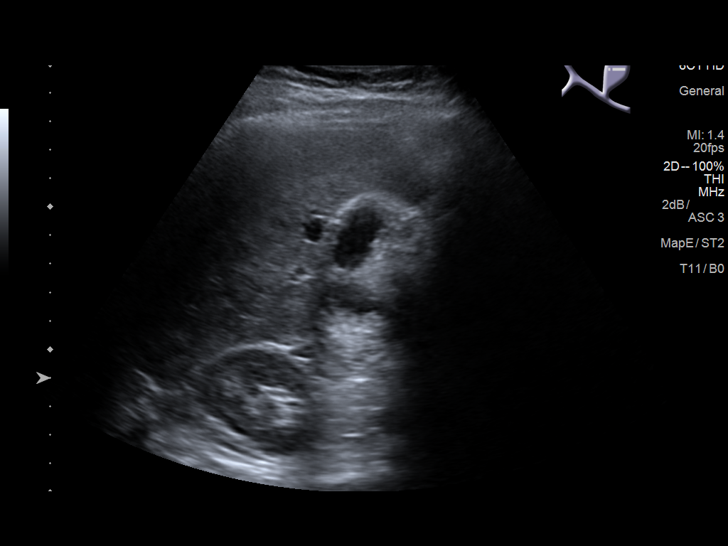
[im 15/89]
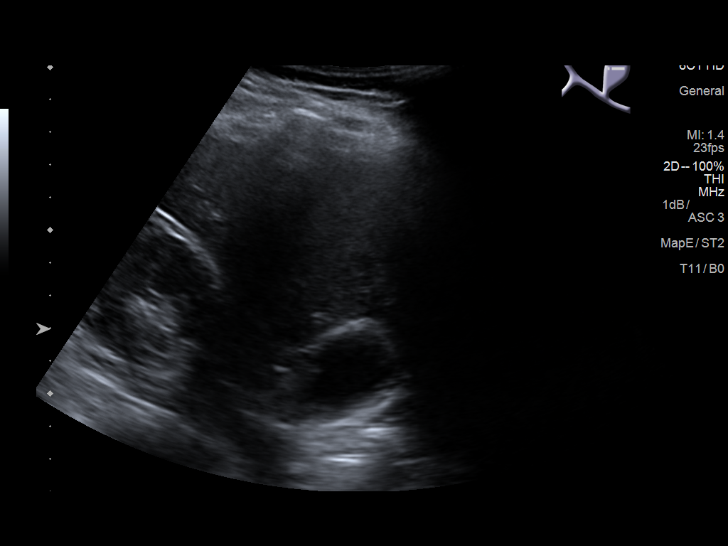
[im 23/89]
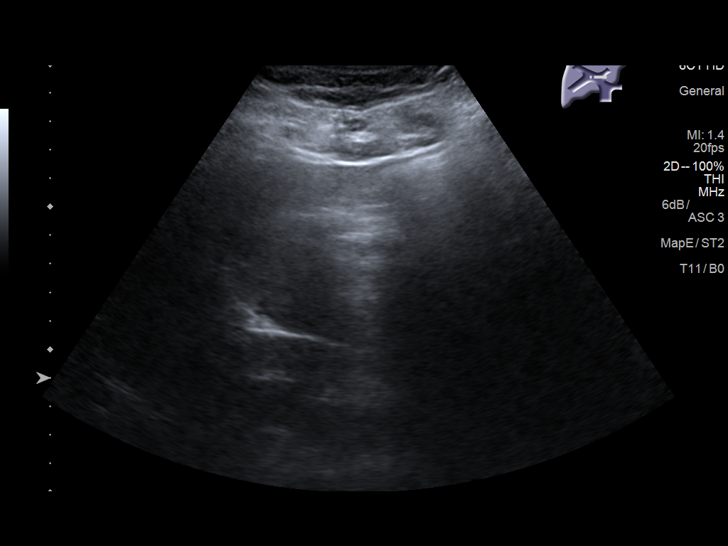
[im 30/89]
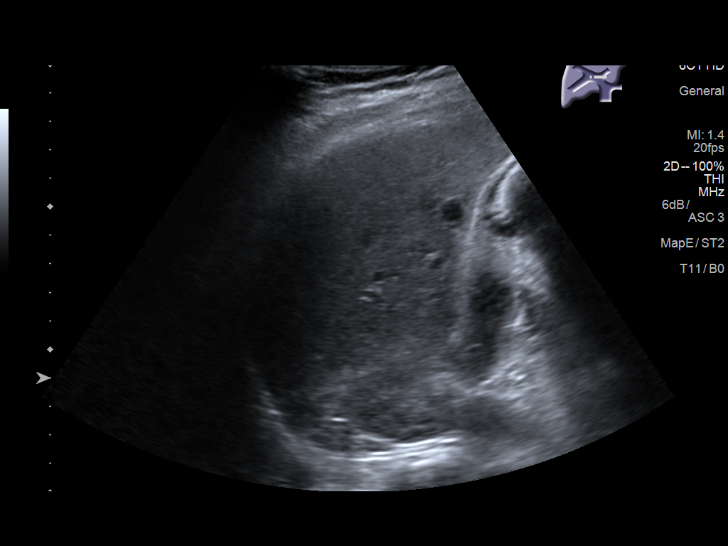
[im 37/89]
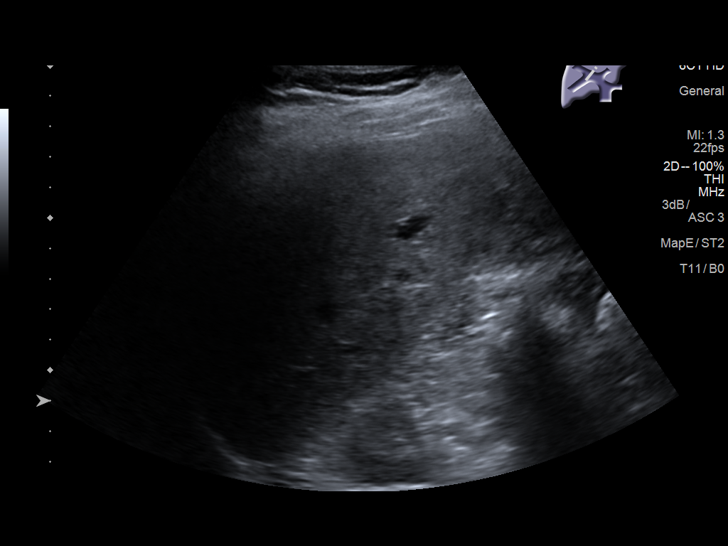
[im 45/89]
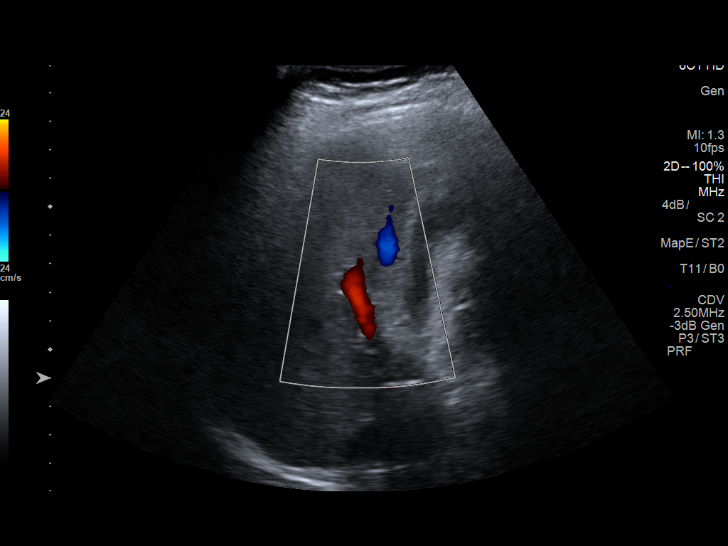
[im 52/89]
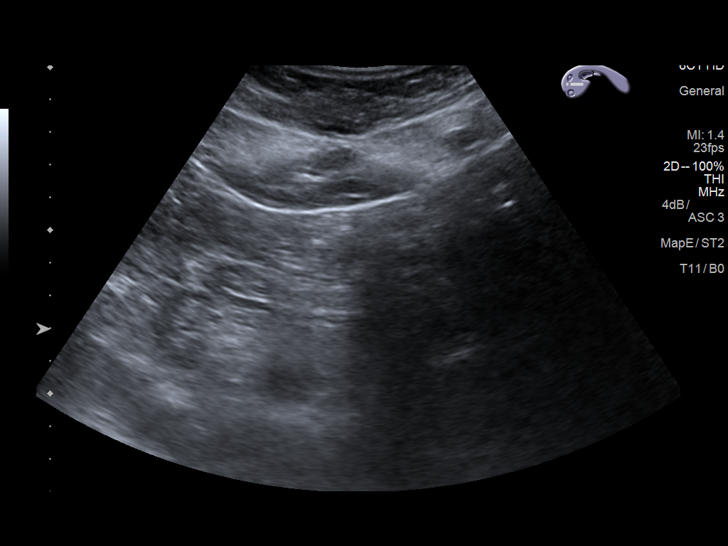
[im 59/89]
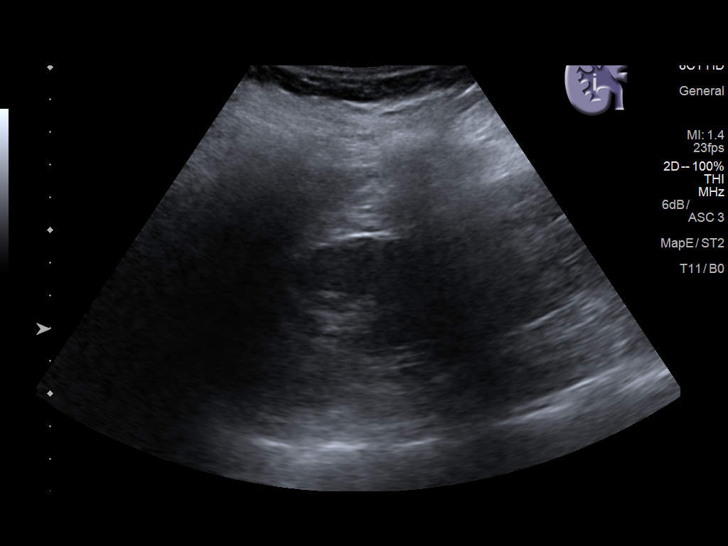
[im 67/89]
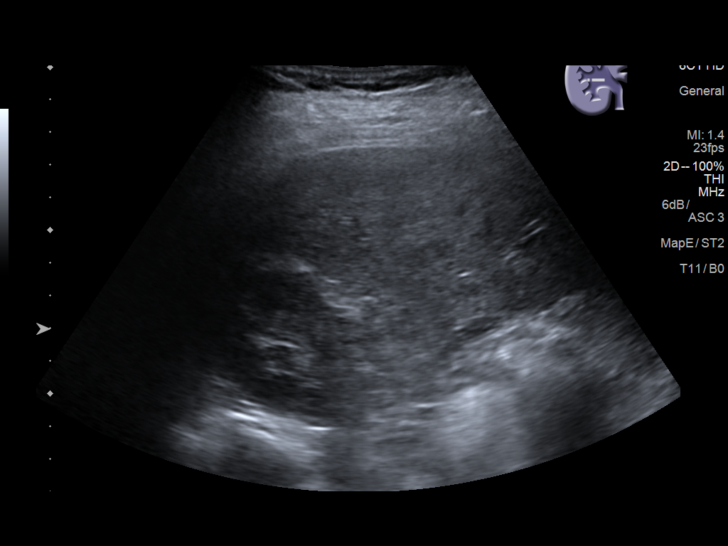
[im 74/89]
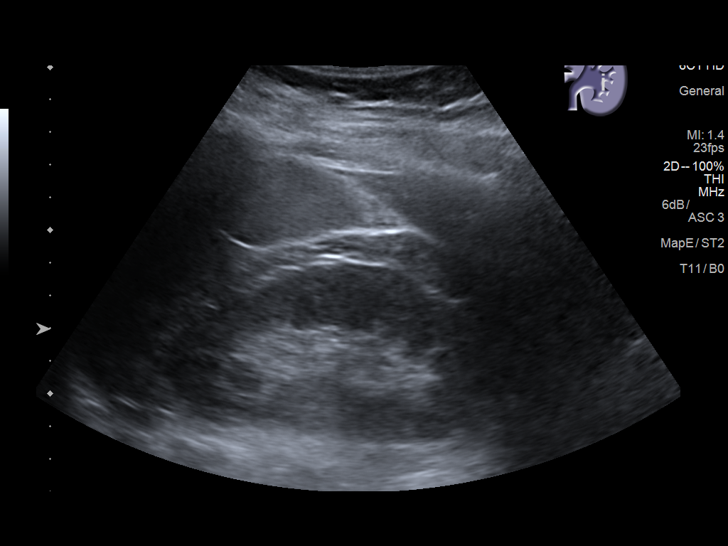
[im 81/89]
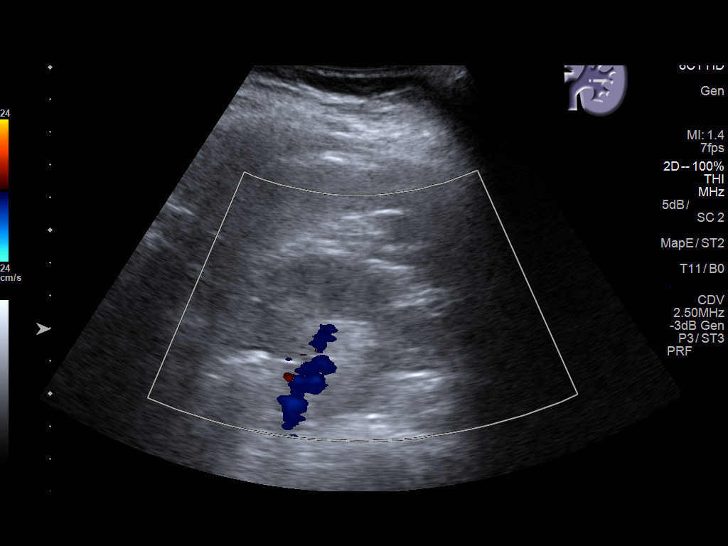
[im 89/89]
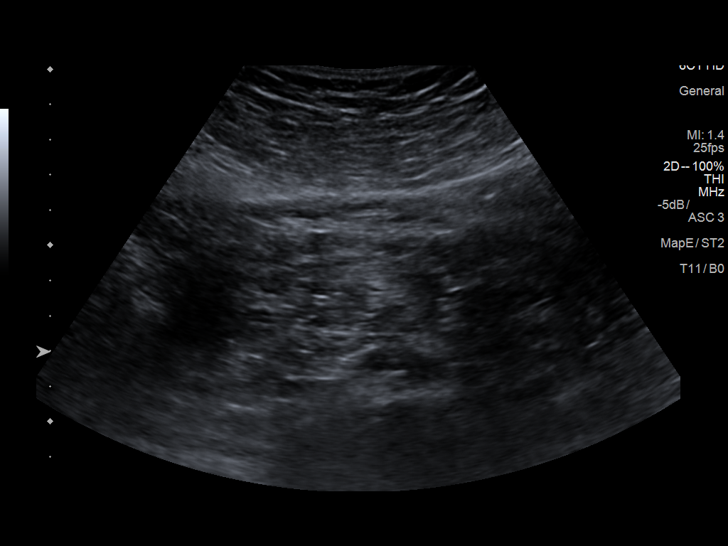

[13 of 25 positions shown; findings below may reference images not displayed]

FINDINGS: Gallbladder: Large approximately 4 mm stone present within the
gallbladder lumen. Associated gallbladder sludge. No sonographic
Murphy sign elicited on exam. Gallbladder wall thickened up to 5 mm,
which may be related to incomplete distension. No free
pericholecystic fluid.

Common bile duct: Diameter: 3 mm

Liver: No focal lesion identified. Within normal limits in
parenchymal echogenicity.

IVC: No abnormality visualized.

Pancreas: Visualized portion unremarkable.

Spleen: Size and appearance within normal limits.

Right Kidney: Length: 9.8 cm. Echogenicity within normal limits. No
mass or hydronephrosis visualized.

Left Kidney: Length: 10.5 cm. Echogenicity within normal limits. No
mass or hydronephrosis visualized.

Abdominal aorta: No aneurysm visualized.

Other findings: None.
IMPRESSION: 1. Cholelithiasis with gallbladder sludge. Gallbladder wall is
thickened up to 5 mm, which may in part be related to gallbladder
contraction. No free pericholecystic fluid or sonographic Murphy's
sign elicited on exam. No biliary dilatation.
2. Otherwise normal abdominal ultrasound. Specifically, normal
appearance of the kidneys. No nephrolithiasis or hydronephrosis.

## 2018-06-09 ENCOUNTER — Other Ambulatory Visit: Payer: Self-pay | Admitting: Orthopedic Surgery

## 2018-06-09 DIAGNOSIS — M5416 Radiculopathy, lumbar region: Secondary | ICD-10-CM

## 2018-06-23 ENCOUNTER — Ambulatory Visit
Admission: RE | Admit: 2018-06-23 | Discharge: 2018-06-23 | Disposition: A | Discharge status: Home / Self Care | Payer: Worker's Compensation | Source: Ambulatory Visit | Attending: Orthopedic Surgery | Admitting: Orthopedic Surgery

## 2018-06-23 DIAGNOSIS — M5416 Radiculopathy, lumbar region: Secondary | ICD-10-CM

## 2018-06-23 MED ORDER — METHYLPREDNISOLONE ACETATE 40 MG/ML INJ SUSP (RADIOLOG
120.0000 mg | Freq: Once | INTRAMUSCULAR | Status: AC
  Administered 2018-06-23: 120 mg via EPIDURAL

## 2018-06-23 MED ORDER — IOPAMIDOL (ISOVUE-M 200) INJECTION 41%
1.0000 mL | Freq: Once | INTRAMUSCULAR | Status: AC
  Administered 2018-06-23: 1 mL via EPIDURAL

## 2018-06-23 NOTE — Discharge Instructions (Signed)

## 2018-07-04 ENCOUNTER — Encounter: Payer: Self-pay | Admitting: Nurse Practitioner

## 2018-07-04 ENCOUNTER — Ambulatory Visit: Payer: Self-pay | Attending: Nurse Practitioner | Admitting: Nurse Practitioner

## 2018-07-04 VITALS — BP 120/73 | HR 60 | Temp 98.9°F | Ht 63.0 in | Wt 160.2 lb

## 2018-07-04 DIAGNOSIS — N921 Excessive and frequent menstruation with irregular cycle: Secondary | ICD-10-CM | POA: Insufficient documentation

## 2018-07-04 DIAGNOSIS — Z0000 Encounter for general adult medical examination without abnormal findings: Secondary | ICD-10-CM

## 2018-07-04 DIAGNOSIS — Z9049 Acquired absence of other specified parts of digestive tract: Secondary | ICD-10-CM | POA: Insufficient documentation

## 2018-07-04 DIAGNOSIS — N946 Dysmenorrhea, unspecified: Secondary | ICD-10-CM | POA: Insufficient documentation

## 2018-07-04 NOTE — Patient Instructions (Signed)
Sangrado uterino anormal °(Abnormal Uterine Bleeding) °Sangrado uterino anormal significa que hay un sangrado por la vagina que no es su período menstrual normal. Puede ser: °· Pérdidas de sangre o hemorragias entre los períodos. °· Hemorragias luego de tener sexo (relaciones sexuales). °· Sangrado abundante o más que lo habitual. °· Períodos que duran más que lo normal. °· Sangrado luego de la menopausia. °Hay muchos problemas que pueden ser la causa. El tratamiento dependerá de la causa del sangrado. Cualquier tipo de sangrado que no sea normal debe consultarse con el médico. °CUIDADOS EN EL HOGAR °Controle su afección para ver si hay cambios. Estas indicaciones podrán disminuir cualquier molestia que tenga: °· No use tampones ni duchas vaginales o como le haya indicado el médico. °· Cambie los apósitos con frecuencia. °Deberá hacerse exámenes pélvicos regulares y pruebas de Papanicolaou. Realice los estudios indicados según le indique su médico. °SOLICITE AYUDA SI: °· El sangrado dura más de 1 semana. °· Se siente mareada por momentos. ° °SOLICITE AYUDA DE INMEDIATO SI: °· Se desmaya. °· Tiene que cambiarse los apósitos cada 15 a 30 minutos. °· Siente dolor en el abdomen. °· Tiene fiebre. °· Se siente débil o presenta sudoración. °· Elimina coágulos grandes por la vagina. °· Siente malestar estomacal (náuseas) y devuelve (vomita). ° °ASEGÚRESE DE QUE: °· Comprende estas instrucciones. °· Controlará su afección. °· Recibirá ayuda de inmediato si no mejora o si empeora. ° °Esta información no tiene como fin reemplazar el consejo del médico. Asegúrese de hacerle al médico cualquier pregunta que tenga. °Document Released: 01/05/2011 Document Revised: 12/08/2013 Document Reviewed: 07/02/2013 °Elsevier Interactive Patient Education © 2017 [employer] ° °

## 2018-07-04 NOTE — Progress Notes (Signed)
Assessment & Plan:  Amy Lloyd was seen today for new patient (initial visit).  Diagnoses and all orders for this visit:  Routine adult health maintenance -     CBC -     CMP14+EGFR -     Lipid panel  Dysmenorrhea -     TSH    Patient has been counseled on age-appropriate routine health concerns for screening and prevention. These are reviewed and up-to-date. Referrals have been placed accordingly. Immunizations are up-to-date or declined.    Subjective:   Chief Complaint  Patient presents with  . New Patient (Initial Visit)    Pt. is here to establish care for primary care.    HPI Amy Lloyd 32 y.o. female presents to office today to establish care. She has a history of metrorrhagia.   Metrorrhagia Patient complains of menstrual symptoms. Symptoms began 14 years ago. Patient describes symptoms of metrorrhagia since she had an IUD placed 14 years which was removed 3 years after placement. Symptoms occur erratically during the cycle. Patient denies labile mood and migraine headaches. She also endorses menorrhagia. She had a cycle last month but no cycle this month. Evaluation to date includes none. Treatment to date includes nothing. The patient is sexually active.      Review of Systems  Constitutional: Negative for fever, malaise/fatigue and weight loss.  HENT: Negative.  Negative for nosebleeds.   Eyes: Negative.  Negative for blurred vision, double vision and photophobia.  Respiratory: Negative.  Negative for cough and shortness of breath.   Cardiovascular: Negative.  Negative for chest pain, palpitations and leg swelling.  Gastrointestinal: Negative.  Negative for heartburn, nausea and vomiting.  Genitourinary:       SEE HPI  Musculoskeletal: Negative.  Negative for myalgias.  Neurological: Negative.  Negative for dizziness, focal weakness, seizures and headaches.  Psychiatric/Behavioral: Negative.  Negative for suicidal ideas.    Past Medical History:    Diagnosis Date  . Gallbladder attack     Past Surgical History:  Procedure Laterality Date  . APPENDECTOMY  09/26/2015   "lap"  . CHOLECYSTECTOMY N/A 11/12/2017   Procedure: LAPAROSCOPIC CHOLECYSTECTOMY;  Surgeon: Rolm Bookbinder, MD;  Location: [facility];  Service: General;  Laterality: N/A;  . LAPAROSCOPIC APPENDECTOMY N/A 09/26/2015   Procedure: APPENDECTOMY LAPAROSCOPIC;  Surgeon: Rolm Bookbinder, MD;  Location: [facility];  Service: General;  Laterality: N/A;    History reviewed. No pertinent family history.  Social History Reviewed with no changes to be made today.   Outpatient Medications Prior to Visit  Medication Sig Dispense Refill  . traMADol (ULTRAM) 50 MG tablet Take by mouth every 6 (six) hours as needed.    Marland Kitchen acetaminophen (TYLENOL) 325 MG tablet Take 2 tablets (650 mg total) by mouth every 6 (six) hours as needed for mild pain (or temp > 100). (Patient not taking: Reported on 07/04/2018)    . ibuprofen (ADVIL,MOTRIN) 600 MG tablet Take 1 tablet (600 mg total) by mouth every 6 (six) hours as needed. (Patient not taking: Reported on 07/04/2018) 30 tablet 0   No facility-administered medications prior to visit.     No Known Allergies     Objective:    BP 120/73 (BP Location: Left Arm, Patient Position: Sitting, Cuff Size: Normal)   Pulse 60   Temp 98.9 F (37.2 C) (Oral)   Ht 5' 3"  (1.6 m)   Wt 160 lb 3.2 oz (72.7 kg)   LMP 06/02/2018   SpO2 96%   BMI 28.38 kg/m  Wt  Readings from Last 3 Encounters:  07/04/18 160 lb 3.2 oz (72.7 kg)  01/09/18 159 lb (72.1 kg)  11/12/17 159 lb 13.3 oz (72.5 kg)    Physical Exam  Constitutional: She is oriented to person, place, and time. She appears well-developed and well-nourished. She is cooperative.  HENT:  Head: Normocephalic and atraumatic.  Eyes: EOM are normal.  Neck: Normal range of motion.  Cardiovascular: Normal rate, regular rhythm and normal heart sounds. Exam reveals no gallop and no friction rub.  No murmur  heard. Pulmonary/Chest: Effort normal and breath sounds normal. No tachypnea. No respiratory distress. She has no decreased breath sounds. She has no wheezes. She has no rhonchi. She has no rales. She exhibits no tenderness.  Abdominal: Bowel sounds are normal.  Musculoskeletal: Normal range of motion. She exhibits no edema.  Neurological: She is alert and oriented to person, place, and time. Coordination normal.  Skin: Skin is warm and dry.  Psychiatric: She has a normal mood and affect. Her behavior is normal. Judgment and thought content normal.  Nursing note and vitals reviewed.      Patient has been counseled extensively about nutrition and exercise as well as the importance of adherence with medications and regular follow-up. The patient was given clear instructions to go to ER or return to medical center if symptoms don't improve, worsen or new problems develop. The patient verbalized understanding.   Follow-up: Return for PAP SMEAR.   Gildardo Pounds, FNP-BC [facility], [place]   07/04/2018, 2:19 PM

## 2018-07-05 LAB — CBC
HEMATOCRIT: 42.4 % (ref 34.0–46.6)
HEMOGLOBIN: 14.3 g/dL (ref 11.1–15.9)
MCH: 29.7 pg (ref 26.6–33.0)
MCHC: 33.7 g/dL (ref 31.5–35.7)
MCV: 88 fL (ref 79–97)
Platelets: 184 10*3/uL (ref 150–450)
RBC: 4.81 x10E6/uL (ref 3.77–5.28)
RDW: 13.1 % (ref 12.3–15.4)
WBC: 6 10*3/uL (ref 3.4–10.8)

## 2018-07-05 LAB — CMP14+EGFR
ALBUMIN: 4.1 g/dL (ref 3.5–5.5)
ALK PHOS: 105 IU/L (ref 39–117)
ALT: 25 IU/L (ref 0–32)
AST: 19 IU/L (ref 0–40)
Albumin/Globulin Ratio: 1.3 (ref 1.2–2.2)
BUN/Creatinine Ratio: 18 (ref 9–23)
BUN: 13 mg/dL (ref 6–20)
Bilirubin Total: 0.5 mg/dL (ref 0.0–1.2)
CO2: 22 mmol/L (ref 20–29)
CREATININE: 0.74 mg/dL (ref 0.57–1.00)
Calcium: 9.2 mg/dL (ref 8.7–10.2)
Chloride: 104 mmol/L (ref 96–106)
GFR, EST AFRICAN AMERICAN: 125 mL/min/{1.73_m2} (ref >59)
GFR, EST NON AFRICAN AMERICAN: 108 mL/min/{1.73_m2} (ref >59)
GLOBULIN, TOTAL: 3.2 g/dL (ref 1.5–4.5)
GLUCOSE: 78 mg/dL (ref 65–99)
POTASSIUM: 4 mmol/L (ref 3.5–5.2)
Sodium: 140 mmol/L (ref 134–144)
TOTAL PROTEIN: 7.3 g/dL (ref 6.0–8.5)

## 2018-07-05 LAB — LIPID PANEL
CHOL/HDL RATIO: 3.3 ratio (ref 0.0–4.4)
Cholesterol, Total: 198 mg/dL (ref 100–199)
HDL: 60 mg/dL (ref >39)
LDL Calculated: 122 mg/dL — ABNORMAL HIGH (ref 0–99)
TRIGLYCERIDES: 78 mg/dL (ref 0–149)
VLDL CHOLESTEROL CAL: 16 mg/dL (ref 5–40)

## 2018-07-05 LAB — TSH: TSH: 2.32 u[IU]/mL (ref 0.450–4.500)

## 2018-07-07 ENCOUNTER — Telehealth: Payer: Self-pay

## 2018-07-07 NOTE — Telephone Encounter (Signed)
CMA attempt to call patient to inform on lab results and advising.  No answer and no voicemail has been set up.  A letter will be send out.  If patient call back, please inform:  Labs are essentially normal. Your LDL or bad cholesterol is slightly elevated. INSTRUCTIONS: Work on a low fat, heart healthy diet and participate in regular aerobic exercise program by working out at least 150 minutes per week; 5 days a week-30 minutes per day. Avoid red meat, fried foods. junk foods, sodas, sugary drinks, unhealthy snacking, alcohol and smoking.  Drink at least 48oz of water per day and monitor your carbohydrate intake daily.

## 2018-07-07 NOTE — Telephone Encounter (Signed)
-----   Message from Claiborne RiggZelda W Fleming, NP sent at 07/06/2018  9:54 PM EDT ----- Labs are essentially normal. Your LDL or bad cholesterol is slightly elevated. INSTRUCTIONS: Work on a low fat, heart healthy diet and participate in regular aerobic exercise program by working out at least 150 minutes per week; 5 days a week-30 minutes per day. Avoid red meat, fried foods. junk foods, sodas, sugary drinks, unhealthy snacking, alcohol and smoking.  Drink at least 48oz of water per day and monitor your carbohydrate intake daily.

## 2018-08-15 ENCOUNTER — Ambulatory Visit: Payer: Self-pay | Attending: Nurse Practitioner | Admitting: Nurse Practitioner

## 2018-08-15 ENCOUNTER — Encounter: Payer: Self-pay | Admitting: Nurse Practitioner

## 2018-08-15 VITALS — BP 105/74 | HR 65 | Temp 98.6°F | Ht 63.0 in | Wt 161.4 lb

## 2018-08-15 DIAGNOSIS — Z01419 Encounter for gynecological examination (general) (routine) without abnormal findings: Secondary | ICD-10-CM | POA: Insufficient documentation

## 2018-08-15 DIAGNOSIS — Z79899 Other long term (current) drug therapy: Secondary | ICD-10-CM | POA: Insufficient documentation

## 2018-08-15 DIAGNOSIS — Z9049 Acquired absence of other specified parts of digestive tract: Secondary | ICD-10-CM | POA: Insufficient documentation

## 2018-08-15 DIAGNOSIS — Z124 Encounter for screening for malignant neoplasm of cervix: Secondary | ICD-10-CM

## 2018-08-15 DIAGNOSIS — Z9889 Other specified postprocedural states: Secondary | ICD-10-CM | POA: Insufficient documentation

## 2018-08-15 DIAGNOSIS — Z23 Encounter for immunization: Secondary | ICD-10-CM

## 2018-08-15 DIAGNOSIS — Z79891 Long term (current) use of opiate analgesic: Secondary | ICD-10-CM | POA: Insufficient documentation

## 2018-08-15 NOTE — Progress Notes (Signed)
Assessment & Plan:  Amy Lloyd was seen today for gynecologic exam.  Diagnoses and all orders for this visit:  Encounter for Papanicolaou smear for cervical cancer screening -     Cytology - PAP    Patient has been counseled on age-appropriate routine health concerns for screening and prevention. These are reviewed and up-to-date. Referrals have been placed accordingly. Immunizations are up-to-date or declined.    Subjective:   Chief Complaint  Patient presents with  . Gynecologic Exam    Pt. is here for a pap smear.    HPI Amy Lloyd 32 y.o. female presents to office today   Review of Systems  Constitutional: Negative.  Negative for chills, fever, malaise/fatigue and weight loss.  Respiratory: Negative.  Negative for cough, shortness of breath and wheezing.   Cardiovascular: Negative.  Negative for chest pain, orthopnea and leg swelling.  Gastrointestinal: Negative for abdominal pain.  Genitourinary: Negative.  Negative for flank pain.  Skin: Negative.  Negative for rash.  Psychiatric/Behavioral: Negative for suicidal ideas.    Past Medical History:  Diagnosis Date  . Gallbladder attack     Past Surgical History:  Procedure Laterality Date  . APPENDECTOMY  09/26/2015   "lap"  . CHOLECYSTECTOMY N/A 11/12/2017   Procedure: LAPAROSCOPIC CHOLECYSTECTOMY;  Surgeon: Emelia LoronWakefield, Matthew, MD;  [facility];  Service: General;  Laterality: N/A;  . LAPAROSCOPIC APPENDECTOMY N/A 09/26/2015   Procedure: APPENDECTOMY LAPAROSCOPIC;  Surgeon: Emelia LoronMatthew Wakefield, MD;  [facility];  Service: General;  Laterality: N/A;    History reviewed. No pertinent family history.  Social History Reviewed with no changes to be made today.   Outpatient Medications Prior to Visit  Medication Sig Dispense Refill  . acetaminophen (TYLENOL) 325 MG tablet Take 2 tablets (650 mg total) by mouth every 6 (six) hours as needed for mild pain (or temp > 100). (Patient not taking: Reported  on 07/04/2018)    . ibuprofen (ADVIL,MOTRIN) 600 MG tablet Take 1 tablet (600 mg total) by mouth every 6 (six) hours as needed. (Patient not taking: Reported on 07/04/2018) 30 tablet 0  . traMADol (ULTRAM) 50 MG tablet Take by mouth every 6 (six) hours as needed.     No facility-administered medications prior to visit.     No Known Allergies     Objective:    BP 105/74 (BP Location: Left Arm, Patient Position: Sitting, Cuff Size: Normal)   Pulse 65   Temp 98.6 F (37 C) (Oral)   Ht 5\' 3"  (1.6 m)   Wt 161 lb 6.4 oz (73.2 kg)   LMP 07/02/2018   SpO2 97%   BMI 28.59 kg/m  Wt Readings from Last 3 Encounters:  08/15/18 161 lb 6.4 oz (73.2 kg)  07/04/18 160 lb 3.2 oz (72.7 kg)  01/09/18 159 lb (72.1 kg)    Physical Exam  Constitutional: She is oriented to person, place, and time. She appears well-developed and well-nourished.  HENT:  Head: Normocephalic.  Cardiovascular: Normal rate, regular rhythm and normal heart sounds.  Pulmonary/Chest: Effort normal and breath sounds normal.  Abdominal: Soft. Bowel sounds are normal. Hernia confirmed negative in the right inguinal area and confirmed negative in the left inguinal area.  Genitourinary: Rectum normal, vagina normal and uterus normal. Rectal exam shows no external hemorrhoid. No labial fusion. There is no rash, tenderness, lesion or injury on the right labia. There is no rash, tenderness, lesion or injury on the left labia. Uterus is not deviated and not enlarged. Cervix exhibits no motion  tenderness, no discharge and no friability. Right adnexum displays no mass, no tenderness and no fullness. Left adnexum displays no mass, no tenderness and no fullness. No erythema, tenderness or bleeding in the vagina. No foreign body in the vagina. No signs of injury around the vagina. No vaginal discharge found.  Lymphadenopathy: No inguinal adenopathy noted on the right or left side.       Right: No inguinal adenopathy present.       Left: No  inguinal adenopathy present.  Neurological: She is alert and oriented to person, place, and time.  Skin: Skin is warm and dry.  Psychiatric: She has a normal mood and affect. Her behavior is normal. Judgment and thought content normal.       Patient has been counseled extensively about nutrition and exercise as well as the importance of adherence with medications and regular follow-up. The patient was given clear instructions to go to ER or return to medical center if symptoms don't improve, worsen or new problems develop. The patient verbalized understanding.   Follow-up: Return if symptoms worsen or fail to improve.   Claiborne Rigg, FNP-BC [facility] [place], [place] [phone]   08/15/2018, 10:35 AM

## 2018-08-15 NOTE — Patient Instructions (Signed)
Pap Test Why am I having this test? A pap test is sometimes called a pap smear. It is a screening test that is used to check for signs of cancer of the vagina, cervix, and uterus. The test can also identify the presence of infection or precancerous changes. Your health care provider will likely recommend you have this test done on a regular basis. This test may be done:  Every 3 years, starting at age 31.  Every 5 years, in combination with testing for the presence of human papillomavirus (HPV).  More or less often depending on other medical conditions.  What kind of sample is taken? Using a small cotton swab, plastic spatula, or brush, your health care provider will collect a sample of cells from the surface of your cervix. Your cervix is the opening to your uterus, also called a womb. Secretions from the cervix and vagina may also be collected. How do I prepare for this test?  Be aware of where you are in your menstrual cycle. You may be asked to reschedule the test if you are menstruating on the day of the test.  You may need to reschedule if you have a known vaginal infection on the day of the test.  You may be asked to avoid douching or taking a bath the day before or the day of the test.  Some medicines can cause abnormal test results, such as digitalis and tetracycline. Talk with your health care provider before your test if you take one of these medicines. What do the results mean? Abnormal test results may indicate a number of health conditions. These may include:  Cancer. Although pap test results cannot be used to diagnose cancer of the cervix, vagina, or uterus, they may suggest the possibility of cancer. Further tests would be required to determine if cancer is present.  Sexually transmitted disease.  Fungal infection.  Parasite infection.  Herpes infection.  A condition causing or contributing to infertility.  It is your responsibility to obtain your test results.  Ask the lab or department performing the test when and how you will get your results. Contact your health care provider to discuss any questions you have about your results. Talk with your health care provider to discuss your results, treatment options, and if necessary, the need for more tests. Talk with your health care provider if you have any questions about your results. This information is not intended to replace advice given to you by your health care provider. Make sure you discuss any questions you have with your health care provider. Document Released: 02/23/2003 Document Revised: 08/08/2016 Document Reviewed: 04/26/2014 Elsevier Interactive Patient Education  2018 Elsevier Inc.  Preventing Cervical Cancer Cervical cancer is cancer that grows on the cervix. The cervix is at the bottom of the uterus. It connects the uterus to the vagina. The uterus is where a baby develops during pregnancy. Cancer occurs when cells become abnormal and start to grow out of control. Cervical cancer grows slowly and may not cause any symptoms at first. Over time, the cancer can grow deep into the cervix tissue and spread to other areas. If it is found early, cervical cancer can be treated effectively. You can also take steps to prevent this type of cancer. Most cases of cervical cancer are caused by an STI (sexually transmitted infection) called human papillomavirus (HPV). One way to reduce your risk of cervical cancer is to avoid infection with the HPV virus. You can do this by practicing safe   sex and by getting the HPV vaccine. Getting regular Pap tests is also important because this can help identify changes in cells that could lead to cancer. Your chances of getting this disease can also be reduced by making certain lifestyle changes. How can I protect myself from cervical cancer? Preventing HPV infection  Ask your health care provider about getting the HPV vaccine. If you are 26 years old or younger, you may  need to get this vaccine, which is given in three doses over 6 months. This vaccine protects against the types of HPV that could cause cancer.  Limit the number of people you have sex with. Also avoid having sex with people who have had many sex partners.  Use a latex condom during sex. Getting Pap tests  Get Pap tests regularly, starting at age 31. Talk with your health care provider about how often you need these tests. ? Most women who are 21?31 years of age should have a Pap test every 3 years. ? Most women who are 30?31 years of age should have a Pap test in combination with an HPV test every 5 years. ? Women with a higher risk of cervical cancer, such as those with a weakened immune system or those who have been exposed to the drug diethylstilbestrol (DES), may need more frequent testing. Making other lifestyle changes  Do not use any products that contain nicotine or tobacco, such as cigarettes and e-cigarettes. If you need help quitting, ask your health care provider.  Eat at least 5 servings of fruits and vegetables every day.  Lose weight if you are overweight. Why are these changes important?  These changes and screening tests are designed to address the factors that are known to increase the risk of cervical cancer. Taking these steps is the best way to reduce your risk.  Having regular Pap tests will help identify changes in cells that could lead to cancer. Steps can then be taken to prevent cancer from developing.  These changes will also help find cervical cancer early. This type of cancer can be treated effectively if it is found early. It can be more dangerous and difficult to treat if cancer has grown deep into your cervix or has spread.  In addition to making you less likely to get cervical cancer, these changes will also provide other health benefits, such as the following: ? Practicing safe sex is important for preventing STIs and unplanned pregnancies. ? Avoiding  tobacco can reduce your risk for other cancers and health issues. ? Eating a healthy diet and maintaining a healthy weight are good for your overall health. What can happen if changes are not made? In the early stages, cervical cancer might not have any symptoms. It can take many years for the cancer to grow and get deep into the cervix tissue. This may be happening without you knowing about it. If you develop any symptoms, such as pelvic pain or unusual discharge or bleeding from your vagina, you should see your health care provider right away. If cervical cancer is not found early, you might need treatments such as radiation, chemotherapy, or surgery. In some cases, surgery may mean that you will not be able to get pregnant or carry a pregnancy to term. Where to find support: Talk with your health care provider, school nurse, or local health department for guidance about screening and vaccination. Some children and teens may be able to get the HPV vaccine free of charge through the U.S. government's   Vaccines for Children (VFC) program. Other places that provide vaccinations include:  Public health clinics. Check with your local health department.  Federally Qualified Health Centers, where you would pay only what you can afford. To find one near you, check this website: www.fqhc.org/find-an-fqhc/  Rural Health Clinics. These are part of a program for Medicare and Medicaid patients who live in rural areas.  The National Breast and Cervical Cancer Early Detection Program also provides breast and cervical cancer screenings and diagnostic services to low-income, uninsured, and underinsured women. Cervical cancer can be passed down through families. Talk with your health care provider or genetic counselor to learn more about genetic testing for cancer. Where to find more information: Learn more about cervical cancer from:  American College of Gynecology:  www.acog.org/Patients/FAQs/Cervical-Cancer  American Cancer Society: www.cancer.org/cancer/cervicalcancer/  U.S. Centers for Disease Control and Prevention: www.cdc.gov/cancer/cervical/  Summary  Talk with your health care provider about getting the HPV vaccine.  Be sure to get regular Pap tests as recommended by your health care provider.  See your health care provider right away if you have any pelvic pain or unusual discharge or bleeding from your vagina. This information is not intended to replace advice given to you by your health care provider. Make sure you discuss any questions you have with your health care provider. Document Released: 12/18/2015 Document Revised: 07/31/2016 Document Reviewed: 07/31/2016 Elsevier Interactive Patient Education  2018 Elsevier Inc.  

## 2018-08-19 LAB — CYTOLOGY - PAP
BACTERIAL VAGINITIS: POSITIVE — AB
Candida vaginitis: NEGATIVE
Chlamydia: NEGATIVE
DIAGNOSIS: NEGATIVE
NEISSERIA GONORRHEA: NEGATIVE
Trichomonas: NEGATIVE

## 2018-08-20 ENCOUNTER — Other Ambulatory Visit: Payer: Self-pay | Admitting: Nurse Practitioner

## 2018-08-20 MED ORDER — METRONIDAZOLE 500 MG PO TABS: 500.0000 mg | ORAL_TABLET | Freq: Two times a day (BID) | ORAL | 0 refills | Status: AC

## 2018-08-20 MED FILL — metroNIDAZOLE 500 MG TABS: 500 | 14 days supply | Qty: 14 | Fill #0

## 2018-08-21 ENCOUNTER — Telehealth: Payer: Self-pay

## 2018-08-21 NOTE — Telephone Encounter (Signed)
-----   Message from Claiborne Rigg, NP sent at 08/20/2018 12:29 AM EDT ----- PAP negative for cervical cancer but positive for Bacterial vaginosis. Will send script to pharmacy

## 2018-08-21 NOTE — Telephone Encounter (Signed)
CMA spoke to patient to inform on lab results and Rx sent.  Patient verified DOB. Patient understood.  [place] interpreter assist with the call Amy Lloyd [phone].

## 2018-08-25 ENCOUNTER — Other Ambulatory Visit: Payer: Self-pay | Admitting: Orthopedic Surgery

## 2018-08-27 ENCOUNTER — Encounter (HOSPITAL_COMMUNITY): Admission: RE | Payer: Self-pay | Source: Ambulatory Visit

## 2018-08-27 ENCOUNTER — Ambulatory Visit (HOSPITAL_COMMUNITY)
Admission: RE | Admit: 2018-08-27 | Payer: Worker's Compensation | Source: Ambulatory Visit | Admitting: Orthopedic Surgery

## 2018-08-27 SURGERY — LUMBAR LAMINECTOMY/DECOMPRESSION MICRODISCECTOMY
Anesthesia: General | Laterality: Left

## 2018-11-11 ENCOUNTER — Ambulatory Visit: Payer: Self-pay | Attending: Nurse Practitioner

## 2018-12-17 NOTE — L&D Delivery Note (Signed)
OB/GYN Faculty Practice Delivery Note  Amy Lloyd is a 33 y.o. [id number] s/p SVD at [redacted]w[redacted]d. She was admitted for IOL for decreased fetal movement.   ROM: 4h 3m with moderate meconium-stained fluid GBS Status: Negative/-- (09/21 0000) Maximum Maternal Temperature: 98.8F  Labor Progress: . Patient presented to L&D for IOL for DFM. Initial SVE: 1/50/-3. Patient received Foley bulb and Pitocin. She had SROM. Received Epidural. She then progressed to complete.   Delivery Date/Time: 10/16 @ 0129 Delivery: Called to room and patient was complete and pushing. Head delivered in LOA position. No nuchal cord present. Shoulder and body delivered in usual fashion. Infant with spontaneous cry, placed on mother's abdomen, dried and stimulated. Cord clamped x 2 after 1-minute delay, and cut by FOB. Cord blood drawn. Placenta delivered spontaneously with gentle cord traction. Fundus firm with massage and Pitocin. Labia, perineum, vagina, and cervix inspected inspected with first degree perineal laceration which was repaired with 3-0 Vicryl in a standard fashion.  Baby Weight: pending  Placenta: Sent to L&D Complications: None Lacerations: 1st degree perineal EBL: 200 mL Analgesia: Epidural   Infant: APGAR (1 MIN): 9   APGAR (5 MINS): 10   APGAR (10 MINS):     Barrington Ellison, MD OB Family Medicine Fellow, [facility] for [facility] [place], [place] [facility] 10/02/2019, 1:46 AM

## 2019-01-08 ENCOUNTER — Emergency Department (HOSPITAL_COMMUNITY): Payer: Self-pay

## 2019-01-08 ENCOUNTER — Emergency Department (HOSPITAL_COMMUNITY)
Admission: EM | Admit: 2019-01-08 | Discharge: 2019-01-08 | Disposition: A | Discharge status: Home / Self Care | Payer: Self-pay | Attending: Emergency Medicine | Admitting: Emergency Medicine

## 2019-01-08 DIAGNOSIS — M546 Pain in thoracic spine: Secondary | ICD-10-CM | POA: Insufficient documentation

## 2019-01-08 DIAGNOSIS — W19XXXA Unspecified fall, initial encounter: Secondary | ICD-10-CM

## 2019-01-08 DIAGNOSIS — H538 Other visual disturbances: Secondary | ICD-10-CM | POA: Insufficient documentation

## 2019-01-08 DIAGNOSIS — R51 Headache: Secondary | ICD-10-CM | POA: Insufficient documentation

## 2019-01-08 DIAGNOSIS — Y9301 Activity, walking, marching and hiking: Secondary | ICD-10-CM | POA: Insufficient documentation

## 2019-01-08 DIAGNOSIS — W000XXA Fall on same level due to ice and snow, initial encounter: Secondary | ICD-10-CM | POA: Insufficient documentation

## 2019-01-08 DIAGNOSIS — M25512 Pain in left shoulder: Secondary | ICD-10-CM | POA: Insufficient documentation

## 2019-01-08 DIAGNOSIS — M25511 Pain in right shoulder: Secondary | ICD-10-CM | POA: Insufficient documentation

## 2019-01-08 DIAGNOSIS — M545 Low back pain: Secondary | ICD-10-CM | POA: Insufficient documentation

## 2019-01-08 DIAGNOSIS — Y929 Unspecified place or not applicable: Secondary | ICD-10-CM | POA: Insufficient documentation

## 2019-01-08 LAB — POC URINE PREG, ED: Preg Test, Ur: NEGATIVE

## 2019-01-08 MED ORDER — DIAZEPAM 5 MG PO TABS
5.0000 mg | ORAL_TABLET | Freq: Once | ORAL | Status: AC
  Administered 2019-01-08: 5 mg via ORAL
  Filled 2019-01-08: qty 1

## 2019-01-08 MED ORDER — METHOCARBAMOL 500 MG PO TABS: 500.0000 mg | ORAL_TABLET | Freq: Two times a day (BID) | ORAL | 0 refills | Status: AC

## 2019-01-08 MED ORDER — NAPROXEN 500 MG PO TABS: 500.0000 mg | ORAL_TABLET | Freq: Two times a day (BID) | ORAL | 0 refills | Status: AC

## 2019-01-08 NOTE — ED Provider Notes (Signed)
[facility] EMERGENCY DEPARTMENT Provider Note   CSN: [id number] Arrival date & time: 01/08/19  1643     History   Chief Complaint Chief Complaint  Patient presents with  . Fall    HPI Amy Lloyd is a 33 y.o. female.  32 y.o female with no pertinent PMH presents to the ED with a chief complaint s/p fall yesterday.  Reports she was walking when she slipped on ice which was on the ground rolling backwards and landing on her back, head, most of her right shoulder.  Today she reports pain along the neck region, back of the head region along with bilateral shoulders.  She has tried taking some Advil but states no improvement in symptoms.  Patient also endorses some blurry vision on the left eye, feeling a shadow over the left.  She denies any nausea, vomiting, dizziness or lightheadedness or LOC.  She denies any shortness of breath or chest pain.     Past Medical History:  Diagnosis Date  . Gallbladder attack     Patient Active Problem List   Diagnosis Date Noted  . Cholecystitis 11/12/2017  . Appendicitis, acute, with peritonitis 09/25/2015    Past Surgical History:  Procedure Laterality Date  . APPENDECTOMY  09/26/2015   "lap"  . CHOLECYSTECTOMY N/A 11/12/2017   Procedure: LAPAROSCOPIC CHOLECYSTECTOMY;  Surgeon: Emelia Loron, MD;  Location: [facility] OR;  Service: General;  Laterality: N/A;  . LAPAROSCOPIC APPENDECTOMY N/A 09/26/2015   Procedure: APPENDECTOMY LAPAROSCOPIC;  Surgeon: Emelia Loron, MD;  Location: [facility] OR;  Service: General;  Laterality: N/A;     OB History    Gravida  2   Para  2   Term  2   Preterm      AB      Living  2     SAB      TAB      Ectopic      Multiple      Live Births  1            Home Medications    Prior to Admission medications   Medication Sig Start Date End Date Taking? Authorizing Provider  acetaminophen (TYLENOL) 325 MG tablet Take 2 tablets (650 mg total) by mouth every 6  (six) hours as needed for mild pain (or temp > 100). Patient not taking: Reported on 07/04/2018 11/13/17   Adam Phenix, PA-C  ibuprofen (ADVIL,MOTRIN) 600 MG tablet Take 1 tablet (600 mg total) by mouth every 6 (six) hours as needed. Patient not taking: Reported on 07/04/2018 01/09/18   Ofilia Neas, PA-C  methocarbamol (ROBAXIN) 500 MG tablet Take 1 tablet (500 mg total) by mouth 2 (two) times daily for 7 days. 01/08/19 01/15/19  Claude Manges, PA-C  naproxen (NAPROSYN) 500 MG tablet Take 1 tablet (500 mg total) by mouth 2 (two) times daily for 7 days. 01/08/19 01/15/19  Claude Manges, PA-C  traMADol (ULTRAM) 50 MG tablet Take by mouth every 6 (six) hours as needed.    [provider]    Family History No family history on file.  Social History Social History   Tobacco Use  . Smoking status: Never Smoker  . Smokeless tobacco: Never Used  Substance Use Topics  . Alcohol use: No  . Drug use: No     Allergies   Patient has no known allergies.   Review of Systems Review of Systems  Constitutional: Negative for fever.  Musculoskeletal: Positive for arthralgias and myalgias.  Neurological: Positive for headaches. Negative for seizures, syncope, weakness and light-headedness.     Physical Exam Updated Vital Signs BP 113/71 (BP Location: Right Arm)   Pulse 67   Temp 98.3 F (36.8 C) (Oral)   Resp 16   LMP 12/31/2018 (Exact Date)   SpO2 100%   Physical Exam Vitals signs and nursing note reviewed.  Constitutional:      General: She is not in acute distress.    Appearance: She is well-developed.  HENT:     Head: Normocephalic and atraumatic.     Mouth/Throat:     Pharynx: No oropharyngeal exudate.  Eyes:     Pupils: Pupils are equal, round, and reactive to light.     Comments: Pupils are reactive, symmetric.  Neck:     Musculoskeletal: Normal range of motion.  Cardiovascular:     Rate and Rhythm: Regular rhythm.     Heart sounds: Normal heart sounds.    Pulmonary:     Effort: Pulmonary effort is normal. No respiratory distress.     Breath sounds: Normal breath sounds.  Abdominal:     General: Bowel sounds are normal. There is no distension.     Palpations: Abdomen is soft.     Tenderness: There is no abdominal tenderness.  Musculoskeletal:        General: No tenderness or deformity.       Back:     Right lower leg: No edema.     Left lower leg: No edema.     Comments: Tenderness Throughout whole back region, tenderness to palpation along right shoulder, lower lumbar spine.  Skin:    General: Skin is warm and dry.  Neurological:     Mental Status: She is alert and oriented to person, place, and time.      ED Treatments / Results  Labs (all labs ordered are listed, but only abnormal results are displayed) Labs Reviewed  POC URINE PREG, ED    EKG None  Radiology Dg Cervical Spine 2-3 Views  Result Date: 01/08/2019 CLINICAL DATA:  Acute neck pain following fall.  Initial encounter. EXAM: CERVICAL SPINE - 2-3 VIEW COMPARISON:  None. FINDINGS: There is no evidence of cervical spine fracture or prevertebral soft tissue swelling. Alignment is normal. No other significant bone abnormalities are identified. IMPRESSION: Negative cervical spine radiographs. Electronically Signed   By: Harmon PierJeffrey  Hu M.D.   On: 01/08/2019 19:51   Dg Thoracic Spine 2 View  Result Date: 01/08/2019 CLINICAL DATA:  Acute mid back pain following fall. Initial encounter. EXAM: THORACIC SPINE 2 VIEWS COMPARISON:  None. FINDINGS: There is no evidence of thoracic spine fracture. Alignment is normal. No other significant bone abnormalities are identified. IMPRESSION: Negative. Electronically Signed   By: Harmon PierJeffrey  Hu M.D.   On: 01/08/2019 19:52   Dg Lumbar Spine 2-3 Views  Result Date: 01/08/2019 CLINICAL DATA:  Acute low back pain following fall. Initial encounter. EXAM: LUMBAR SPINE - 2-3 VIEW COMPARISON:  None. FINDINGS: There is no evidence of lumbar spine  fracture. Alignment is normal. Intervertebral disc spaces are maintained. IMPRESSION: Negative. Electronically Signed   By: Harmon PierJeffrey  Hu M.D.   On: 01/08/2019 19:53    Procedures Procedures (including critical care time)  Medications Ordered in ED Medications  diazepam (VALIUM) tablet 5 mg (5 mg Oral Given 01/08/19 2021)     Initial Impression / Assessment and Plan / ED Course  I have reviewed the triage vital signs and the nursing notes.  Pertinent labs &  imaging results that were available during my care of the patient were reviewed by me and considered in my medical decision making (see chart for details).   Presents status post falling on ice striking her head on the concrete along with her back.  She reports no LOC.  Patient has not taken any medication for relief.  All imaging was negative such as C-spine, thoracic spine, lumbar spine for any acute abnormality.  Patient has been provided with Valium during ED visit will reassess for symptoms.  She will be sent with a prescription at home for muscle relaxers along with naproxen and PCP follow-up.  Canadian head CT unnecessary at this time.  Return precautions provided at length for patient in Spanish by myself.   Final Clinical Impressions(s) / ED Diagnoses   Final diagnoses:  Fall, initial encounter    ED Discharge Orders         Ordered    methocarbamol (ROBAXIN) 500 MG tablet  2 times daily     01/08/19 2011    naproxen (NAPROSYN) 500 MG tablet  2 times daily     01/08/19 2011           Claude MangesSoto, Dijon Kohlman, Cordelia Poche-C 01/08/19 2044    Jacalyn LefevreHaviland, Julie, MD 01/09/19 1106

## 2019-01-08 NOTE — ED Notes (Signed)
Patient transported to X-ray 

## 2019-01-08 NOTE — ED Triage Notes (Signed)
Pt slipped on ice yesterday and fell on her back and hit the back of her head. Pt now has ha and mid to upper back pain. Ambulatory and moving all extremities. VSS.

## 2019-01-08 NOTE — Discharge Instructions (Addendum)
Your x-rays today were negative for any acute dislocation or fracture.  I have prescribed medication to help with your symptoms.  Please follow-up with your primary care physician as needed.  If you experience any dizziness, lightheaded, weakness please return to the ED for reevaluation.

## 2019-01-08 NOTE — ED Notes (Signed)
Pt back from X-ray. Radiology is waiting for results of Urine Pregnancy test before moving forward with X-ray orders. Pt is currently in the restroom providing a urine sample.

## 2019-01-09 ENCOUNTER — Telehealth: Payer: Self-pay | Admitting: Nurse Practitioner

## 2019-01-09 MED FILL — METHOCARBAMOL 500 MG TABS: 500 | 7 days supply | Qty: 14 | Fill #0

## 2019-01-09 MED FILL — NAPROXEN 500 MG TABLET: 500 | 7 days supply | Qty: 14 | Fill #0

## 2019-01-12 NOTE — Telephone Encounter (Signed)
LVM to Pt informed her that the letter was sent last week if she want to stop bny the office and she be able to get another copy

## 2019-02-19 ENCOUNTER — Inpatient Hospital Stay (HOSPITAL_COMMUNITY): Payer: Self-pay

## 2019-02-19 ENCOUNTER — Inpatient Hospital Stay (HOSPITAL_COMMUNITY)
Admission: AD | Admit: 2019-02-19 | Discharge: 2019-02-19 | Disposition: A | Discharge status: Home / Self Care | Payer: Self-pay | Attending: Obstetrics and Gynecology | Admitting: Obstetrics and Gynecology

## 2019-02-19 ENCOUNTER — Encounter (HOSPITAL_COMMUNITY): Payer: Self-pay

## 2019-02-19 ENCOUNTER — Other Ambulatory Visit: Payer: Self-pay

## 2019-02-19 DIAGNOSIS — O210 Mild hyperemesis gravidarum: Secondary | ICD-10-CM | POA: Insufficient documentation

## 2019-02-19 DIAGNOSIS — O26891 Other specified pregnancy related conditions, first trimester: Secondary | ICD-10-CM

## 2019-02-19 DIAGNOSIS — O219 Vomiting of pregnancy, unspecified: Secondary | ICD-10-CM

## 2019-02-19 DIAGNOSIS — Z3A08 8 weeks gestation of pregnancy: Secondary | ICD-10-CM | POA: Insufficient documentation

## 2019-02-19 DIAGNOSIS — Z3491 Encounter for supervision of normal pregnancy, unspecified, first trimester: Secondary | ICD-10-CM

## 2019-02-19 DIAGNOSIS — R109 Unspecified abdominal pain: Secondary | ICD-10-CM | POA: Insufficient documentation

## 2019-02-19 LAB — POCT PREGNANCY, URINE: Preg Test, Ur: POSITIVE — AB

## 2019-02-19 LAB — URINALYSIS, ROUTINE W REFLEX MICROSCOPIC
Bilirubin Urine: NEGATIVE
Glucose, UA: NEGATIVE mg/dL
Ketones, ur: 80 mg/dL — AB
Nitrite: NEGATIVE
Protein, ur: 30 mg/dL — AB
Specific Gravity, Urine: 1.032 — ABNORMAL HIGH (ref 1.005–1.030)
pH: 5 (ref 5.0–8.0)

## 2019-02-19 LAB — CBC
HCT: 36.7 % (ref 36.0–46.0)
HEMOGLOBIN: 13.4 g/dL (ref 12.0–15.0)
MCH: 32.4 pg (ref 26.0–34.0)
MCHC: 36.5 g/dL — ABNORMAL HIGH (ref 30.0–36.0)
MCV: 88.6 fL (ref 80.0–100.0)
Platelets: 176 10*3/uL (ref 150–400)
RBC: 4.14 MIL/uL (ref 3.87–5.11)
RDW: 11.7 % (ref 11.5–15.5)
WBC: 8.1 10*3/uL (ref 4.0–10.5)
nRBC: 0 % (ref 0.0–0.2)

## 2019-02-19 LAB — HCG, QUANTITATIVE, PREGNANCY: hCG, Beta Chain, Quant, S: 181154 m[IU]/mL — ABNORMAL HIGH (ref <5)

## 2019-02-19 MED ORDER — METOCLOPRAMIDE HCL 10 MG PO TABS: 10.0000 mg | ORAL_TABLET | Freq: Three times a day (TID) | ORAL | 0 refills | Status: DC | PRN

## 2019-02-19 NOTE — Discharge Instructions (Signed)
[facility] Prenatal Care Providers   Center for [facility] at [facility]       Phone: [phone]  Center for [facility] at [place] Phone: [phone]  Center for [facility] [place]  Phone: [phone]  Center for [facility] at [facility]  Phone: [phone]  Center for [facility] at [place]  Phone: [phone]  [facility]       Phone: [phone]  [facility]    Phone: [phone]   [facility])    Phone: [phone]  [facility]    Phone: [phone]  [facility]    Phone: [phone]   [facility]  Phone: [phone]  [facility]    Phone: [phone]  [facility]   Phone: [phone]  [facility]    Phone: [phone]     Nuseas matinales Morning Sickness  Se denomina nuseas matinales a las ganas de vomitar de las mujeres durante el [place]. Esta sensacin puede estar acompaada o no de vmitos. Suelen aparecer por la maana, pero pueden ser un problema a lo largo de [employer]. Las nuseas matinales son ms frecuentes Social research officer, government. En algunos casos, podran continuar durante todo el [place]. Aunque son [place], generalmente, no causan ningn dao, excepto que una mujer presente vmitos continuos e intensos (hipermesis gravdica), una afeccin que requiere un tratamiento ms intenso. Cules son las causas? La causa exacta de esta afeccin no se conoce, pero estas parecen estar relacionadas con los cambios hormonales normales que ocurren durante el [place]. Qu incrementa el riesgo? Es ms probable que usted sufra esta afeccin si:  Tena nuseas o vmitos antes de quedar embarazada.  Tuvo nuseas matinales en algn embarazo anterior.  Est embarazada de ms de un  beb, por ejemplo, mellizos. Cules son los signos o los sntomas? Los sntomas de esta afeccin [employer] siguientes:  Nuseas.  Vmitos. Cmo se diagnostica? Esta afeccin suele diagnosticarse en funcin de los signos y los sntomas. Cmo se trata? En muchos casos, no se requiere tratamiento para esta afeccin. Hacer algunos cambios en su dieta podra ayudar a controlar los sntomas. El mdico tambin podra recetarle o recomendarle lo siguiente:  Suplementos de vitamina B6.  Medicamentos para las nauseas.  Jengibre. Siga estas indicaciones en su casa: Medicamentos  [employer] de venta libre y los recetados solamente como se lo haya indicado el mdico. No utilice ningn medicamento recetado, de venta libre ni herbario para tratar este problema sin consultar con su mdico antes.  Tomar un multivitamnico antes de Scientist, research (physical sciences) puede prevenir o disminuir la gravedad de las nuseas matinales en la mayora de las [place]. Comida y bebida  Coma un trozo de [place] o galletas secas antes de levantarse de la cama por la [place].  Coma 5 o 6 comidas pequeas por da.  Consuma alimentos blandos y secos, como arroz o papas asadas. Los alimentos ricos en carbohidratos generalmente ayudan.  Evite los alimentos muy grasos y condimentados.  Pdale a otra persona que cocine por usted si el olor de algn alimento le provoca nuseas o vmitos.  Si tiene ganas de vomitar despus de tomar las vitaminas prenatales, tmelas a la noche o con una colacin.  Tome colaciones de alimentos proteicos entre comidas si siente apetito. Los frutos secos, el yogur y el queso son buenas opciones.  Beba lquidos durante todo [employer].  Pruebe a tomar  gaseosa de jengibre hecha con jengibre natural, t de jengibre hecho con jengibre fresco rallado o caramelos de jengibre. Instrucciones generales  No consuma ningn producto que contenga nicotina o tabaco, como cigarrillos y Soil scientist. Si necesita ayuda para dejar de fumar, consulte al [employer].  Consiga un purificador de aire para Radio producer aire de su casa libre de [place].  Trate de respirar Engineer, site.  Intente evitar los olores que le provocan nuseas.  Considere la posibilidad de Electrical engineer los siguientes mtodos para Paramedic los sntomas: ? Usar una pulsera de acupresin. Estas pulseras suelen usarse contra los [employer]. ? Acupuntura. Comunquese con un mdico si:  Los remedios caseros no funcionan, y Media planner.  Se siente mareada o que va a desvanecerse.  Pierde peso. Solicite ayuda de inmediato si:  Tiene nuseas y vmitos de [place] persistente y no puede controlarlos.  Se desmaya.  Siente un dolor intenso en el abdomen. Resumen  Se denomina nuseas matinales a las ganas de vomitar de las mujeres durante el [address]. Esta sensacin puede estar acompaada o no de vmitos.  Las nuseas matinales son ms frecuentes Social research officer, government.  Suelen aparecer por la maana, pero pueden ser un problema a lo largo de [employer].  En muchos casos, no se requiere tratamiento para esta afeccin. Hacer algunos cambios en su dieta podra ayudar a controlar los sntomas. Esta informacin no tiene Theme park manager el consejo del mdico. Asegrese de hacerle al mdico cualquier pregunta que tenga. Document Released: 03/21/2009 Document Revised: 09/02/2017 Document Reviewed: 09/02/2017 Elsevier Interactive Patient Education  2019 [employer].

## 2019-02-19 NOTE — MAU Note (Signed)
Patient states she started having abdominal pain 2 days ago-comes and goes.  No VB/discharge.  LMP in [place] of exact date.

## 2019-02-19 NOTE — MAU Provider Note (Signed)
Chief Complaint: Abdominal Pain   First Provider Initiated Contact with Patient 02/19/19 2127     SUBJECTIVE HPI: Amy Lloyd is a 33 y.o. G3P2002 at [redacted]w[redacted]d by LMP who presents to Maternity Admissions reporting abdominal pain. Symptoms began 2 days ago. Cramping throughout lower abdomen & is intermittent. Has had increased nausea but no vomiting. Last BM was this morning. Denies vaginal bleeding, vaginal discharge, or dysuria.   Location: abdomen Quality: cramping Severity: 7/10 on pain scale Duration: 2 days Timing: intermittent Modifying factors: none Associated signs and symptoms: none  Past Medical History:  Diagnosis Date  . Gallbladder attack    OB History  Gravida Para Term Preterm AB Living  SAB TAB Ectopic Multiple Live Births          2    # Outcome Date GA Lbr Len/2nd Weight Sex Delivery Anes PTL Lv  3 Current           2 Term 05/15/12 [redacted]w[redacted]d 12:42 / 00:21 2951 g F Vag-Spont EPI  LIV     Birth Comments: na  1 Term 11/13/04    Rolm Baptise   Past Surgical History:  Procedure Laterality Date  . APPENDECTOMY  09/26/2015   "lap"  . CHOLECYSTECTOMY N/A 11/12/2017   Procedure: LAPAROSCOPIC CHOLECYSTECTOMY;  Surgeon: Emelia Loron, MD;  Location: [facility] OR;  Service: General;  Laterality: N/A;  . LAPAROSCOPIC APPENDECTOMY N/A 09/26/2015   Procedure: APPENDECTOMY LAPAROSCOPIC;  Surgeon: Emelia Loron, MD;  Location: [facility] OR;  Service: General;  Laterality: N/A;   Social History   Socioeconomic History  . Marital status: Significant Other    Spouse name: Not on file  . Number of children: Not on file  . Years of education: Not on file  . Highest education level: Not on file  Occupational History  . Not on file  Social Needs  . Financial resource strain: Not on file  . Food insecurity:    Worry: Not on file    Inability: Not on file  . Transportation needs:    Medical: Not on file    Non-medical: Not on file  Tobacco Use  .  Smoking status: Never Smoker  . Smokeless tobacco: Never Used  Substance and Sexual Activity  . Alcohol use: No  . Drug use: No  . Sexual activity: Not Currently    Birth control/protection: None  Lifestyle  . Physical activity:    Days per week: Not on file    Minutes per session: Not on file  . Stress: Not on file  Relationships  . Social connections:    Talks on phone: Not on file    Gets together: Not on file    Attends religious service: Not on file    Active member of club or organization: Not on file    Attends meetings of clubs or organizations: Not on file    Relationship status: Not on file  . Intimate partner violence:    Fear of current or ex partner: Not on file    Emotionally abused: Not on file    Physically abused: Not on file    Forced sexual activity: Not on file  Other Topics Concern  . Not on file  Social History Narrative  . Not on file   No family history on file. No current facility-administered medications on file prior to encounter.    Current Outpatient Medications on File Prior to Encounter  Medication Sig  Dispense Refill  . acetaminophen (TYLENOL) 325 MG tablet Take 2 tablets (650 mg total) by mouth every 6 (six) hours as needed for mild pain (or temp > 100). (Patient not taking: Reported on 07/04/2018)    . ibuprofen (ADVIL,MOTRIN) 600 MG tablet Take 1 tablet (600 mg total) by mouth every 6 (six) hours as needed. (Patient not taking: Reported on 07/04/2018) 30 tablet 0  . traMADol (ULTRAM) 50 MG tablet Take by mouth every 6 (six) hours as needed.     No Known Allergies  I have reviewed patient's Past Medical Hx, Surgical Hx, Family Hx, Social Hx, medications and allergies.   Review of Systems  Constitutional: Negative.   Gastrointestinal: Positive for abdominal pain and nausea. Negative for constipation, diarrhea and vomiting.  Genitourinary: Negative.     OBJECTIVE Patient Vitals for the past 24 hrs:  BP Temp Pulse Resp  02/19/19 2111  113/73 98.3 F (36.8 C) 66 17   Constitutional: Well-developed, well-nourished female in no acute distress.  Cardiovascular: normal rate & rhythm, no murmur Respiratory: normal rate and effort. Lung sounds clear throughout GI: Abd soft, non-tender, Pos BS x 4. No guarding or rebound tenderness MS: Extremities nontender, no edema, normal ROM Neurologic: Alert and oriented x 4.      LAB RESULTS Results for orders placed or performed during the hospital encounter of 02/19/19 (from the past 24 hour(s))  Pregnancy, urine POC     Status: Abnormal   Collection Time: 02/19/19  8:46 PM  Result Value Ref Range   Preg Test, Ur POSITIVE (A) NEGATIVE  Urinalysis, Routine w reflex microscopic     Status: Abnormal   Collection Time: 02/19/19  8:49 PM  Result Value Ref Range   Color, Urine YELLOW YELLOW   APPearance CLOUDY (A) CLEAR   Specific Gravity, Urine 1.032 (H) 1.005 - 1.030   pH 5.0 5.0 - 8.0   Glucose, UA NEGATIVE NEGATIVE mg/dL   Hgb urine dipstick SMALL (A) NEGATIVE   Bilirubin Urine NEGATIVE NEGATIVE   Ketones, ur 80 (A) NEGATIVE mg/dL   Protein, ur 30 (A) NEGATIVE mg/dL   Nitrite NEGATIVE NEGATIVE   Leukocytes,Ua LARGE (A) NEGATIVE   RBC / HPF 6-10 0 - 5 RBC/hpf   WBC, UA 21-50 0 - 5 WBC/hpf   Bacteria, UA MANY (A) NONE SEEN   Squamous Epithelial / LPF 21-50 0 - 5   Mucus PRESENT   CBC     Status: Abnormal   Collection Time: 02/19/19  9:44 PM  Result Value Ref Range   WBC 8.1 4.0 - 10.5 K/uL   RBC 4.14 3.87 - 5.11 MIL/uL   Hemoglobin 13.4 12.0 - 15.0 g/dL   HCT 43.3 29.5 - 18.8 %   MCV 88.6 80.0 - 100.0 fL   MCH 32.4 26.0 - 34.0 pg   MCHC 36.5 (H) 30.0 - 36.0 g/dL   RDW 41.6 60.6 - 30.1 %   Platelets 176 150 - 400 K/uL   nRBC 0.0 0.0 - 0.2 %    IMAGING US Ob Comp Less 14 Wks  Result Date: 02/19/2019 CLINICAL DATA:  33 y/o F; intermittent lower abdominal pain for 2 days. Positive urine pregnancy test. EXAM: OBSTETRIC <14 WK ULTRASOUND TECHNIQUE: Transabdominal  ultrasound was performed for evaluation of the gestation as well as the maternal uterus and adnexal regions. COMPARISON:  None. FINDINGS: Intrauterine gestational sac: Single Yolk sac:  Visualized. Embryo:  Visualized. Cardiac Activity: Visualized. Heart Rate: 169 bpm CRL: 19.8 mm 8 w 3  d                  [place] EDC: 09/28/2019 Subchorionic hemorrhage:  None visualized. Maternal uterus/adnexae: Normal appearance of the bilateral adnexa and the uterus. No free fluid in the pelvis. IMPRESSION: Single live intrauterine pregnancy with estimated gestational age of [redacted] weeks and 3 days. No acute process identified. Electronically Signed   By: Mitzi Hansen M.D.   On: 02/19/2019 22:20    MAU COURSE Orders Placed This Encounter  Procedures  . Culture, OB Urine  . US OB Comp Less 14 Wks  . Urinalysis, Routine w reflex microscopic  . CBC  . hCG, quantitative, pregnancy  . Pregnancy, urine POC  . Discharge patient   Meds ordered this encounter  Medications  . metoCLOPramide (REGLAN) 10 MG tablet    Sig: Take 1 tablet (10 mg total) by mouth every 8 (eight) hours as needed for nausea.    Dispense:  30 tablet    Refill:  0    Order Specific Question:   Supervising Provider    Answer:   Willodean Rosenthal [[id number]]    MDM +UPT UA, CBC, ABO/Rh, quant hCG, and [place] today to rule out ectopic pregnancy  Ultrasound shows live IUP.   U/a with evidence of dehydration. Pt declines antiemetic or IV fluids. Some leuks & bacteria on micro. No urinary complaints. Will send for culture.   ASSESSMENT 1. Normal IUP (intrauterine pregnancy) on prenatal ultrasound, first trimester   2. Abdominal pain during pregnancy in first trimester   3. [redacted] weeks gestation of pregnancy   4. Nausea and vomiting during pregnancy prior to [redacted] weeks gestation     PLAN Discharge home in stable condition. SAB precautions Urine culture pending  Allergies as of 02/19/2019   No Known Allergies     Medication List     STOP taking these medications   ibuprofen 600 MG tablet Commonly known as:  ADVIL,MOTRIN   traMADol 50 MG tablet Commonly known as:  ULTRAM     TAKE these medications   acetaminophen 325 MG tablet Commonly known as:  TYLENOL Take 2 tablets (650 mg total) by mouth every 6 (six) hours as needed for mild pain (or temp > 100).   metoCLOPramide 10 MG tablet Commonly known as:  REGLAN Take 1 tablet (10 mg total) by mouth every 8 (eight) hours as needed for nausea.        Judeth Horn, NP 02/19/2019  10:34 PM

## 2019-02-21 LAB — CULTURE, OB URINE

## 2019-03-18 ENCOUNTER — Other Ambulatory Visit: Payer: Self-pay

## 2019-03-18 ENCOUNTER — Encounter (HOSPITAL_COMMUNITY): Payer: Self-pay | Admitting: *Deleted

## 2019-03-18 ENCOUNTER — Inpatient Hospital Stay (HOSPITAL_COMMUNITY)
Admission: AD | Admit: 2019-03-18 | Discharge: 2019-03-18 | Disposition: A | Discharge status: Home / Self Care | Payer: Self-pay | Attending: Obstetrics & Gynecology | Admitting: Obstetrics & Gynecology

## 2019-03-18 DIAGNOSIS — O9989 Other specified diseases and conditions complicating pregnancy, childbirth and the puerperium: Secondary | ICD-10-CM | POA: Insufficient documentation

## 2019-03-18 DIAGNOSIS — Z3A12 12 weeks gestation of pregnancy: Secondary | ICD-10-CM

## 2019-03-18 DIAGNOSIS — O23591 Infection of other part of genital tract in pregnancy, first trimester: Secondary | ICD-10-CM

## 2019-03-18 DIAGNOSIS — B9689 Other specified bacterial agents as the cause of diseases classified elsewhere: Secondary | ICD-10-CM | POA: Insufficient documentation

## 2019-03-18 DIAGNOSIS — O26891 Other specified pregnancy related conditions, first trimester: Secondary | ICD-10-CM

## 2019-03-18 DIAGNOSIS — Z9049 Acquired absence of other specified parts of digestive tract: Secondary | ICD-10-CM | POA: Insufficient documentation

## 2019-03-18 DIAGNOSIS — N939 Abnormal uterine and vaginal bleeding, unspecified: Secondary | ICD-10-CM | POA: Insufficient documentation

## 2019-03-18 DIAGNOSIS — R1030 Lower abdominal pain, unspecified: Secondary | ICD-10-CM | POA: Insufficient documentation

## 2019-03-18 DIAGNOSIS — N760 Acute vaginitis: Secondary | ICD-10-CM | POA: Insufficient documentation

## 2019-03-18 DIAGNOSIS — O26899 Other specified pregnancy related conditions, unspecified trimester: Secondary | ICD-10-CM

## 2019-03-18 DIAGNOSIS — R109 Unspecified abdominal pain: Secondary | ICD-10-CM

## 2019-03-18 LAB — CBC
HCT: 38.9 % (ref 36.0–46.0)
Hemoglobin: 14 g/dL (ref 12.0–15.0)
MCH: 32.3 pg (ref 26.0–34.0)
MCHC: 36 g/dL (ref 30.0–36.0)
MCV: 89.6 fL (ref 80.0–100.0)
Platelets: 168 10*3/uL (ref 150–400)
RBC: 4.34 MIL/uL (ref 3.87–5.11)
RDW: 12.1 % (ref 11.5–15.5)
WBC: 9.8 10*3/uL (ref 4.0–10.5)
nRBC: 0 % (ref 0.0–0.2)

## 2019-03-18 LAB — URINALYSIS, ROUTINE W REFLEX MICROSCOPIC
Bilirubin Urine: NEGATIVE
Glucose, UA: NEGATIVE mg/dL
Hgb urine dipstick: NEGATIVE
Ketones, ur: NEGATIVE mg/dL
Nitrite: NEGATIVE
Protein, ur: NEGATIVE mg/dL
Specific Gravity, Urine: 1.024 (ref 1.005–1.030)
pH: 5 (ref 5.0–8.0)

## 2019-03-18 LAB — WET PREP, GENITAL
Sperm: NONE SEEN
Trich, Wet Prep: NONE SEEN
Yeast Wet Prep HPF POC: NONE SEEN

## 2019-03-18 LAB — HCG, QUANTITATIVE, PREGNANCY: hCG, Beta Chain, Quant, S: 96108 m[IU]/mL — ABNORMAL HIGH (ref <5)

## 2019-03-18 MED ORDER — METRONIDAZOLE 500 MG PO TABS: 500.0000 mg | ORAL_TABLET | Freq: Two times a day (BID) | ORAL | 0 refills | Status: DC

## 2019-03-18 NOTE — MAU Note (Signed)
Pt presents to MAU c/o vaginal bleeding when she wipes. Pt reports the bleeding is a pinkish color and she sees some pieces of clots in the toilet bowl. Pt reports some abdominal cramping that she rates a 7/10.

## 2019-03-18 NOTE — MAU Provider Note (Signed)
History     CSN: [id number]  Arrival date and time: 03/18/19 2105   First Provider Initiated Contact with Patient 03/18/19 2148      Chief Complaint  Patient presents with  . Abdominal Pain  . Vaginal Bleeding   Ms. Derrell Lolling presents for vaginal bleeding and abdominal pain She reports that starting around 4pm today, she started to have intense lower abdominal cramps Soon after, she noticed a pink tinge on her paper when she wiped after urination She has continued to have pain, but it is milder now than onset She has not taken any medication or used any other methods to alleviate pain Pink tinged toilet paper has continued since onset Given her pregnancy, she is concerned for miscarriage and would like to be evaluated She denies dysuria, vaginal itching or burning, constipation or history of hemorrhoids No frank bleeding or staining underwear with blood between urination   Past Medical History:  Diagnosis Date  . Gallbladder attack     Past Surgical History:  Procedure Laterality Date  . APPENDECTOMY  09/26/2015   "lap"  . CHOLECYSTECTOMY N/A 11/12/2017   Procedure: LAPAROSCOPIC CHOLECYSTECTOMY;  Surgeon: Emelia Loron, MD;  Location: [facility] OR;  Service: General;  Laterality: N/A;  . LAPAROSCOPIC APPENDECTOMY N/A 09/26/2015   Procedure: APPENDECTOMY LAPAROSCOPIC;  Surgeon: Emelia Loron, MD;  Location: [facility] OR;  Service: General;  Laterality: N/A;    History reviewed. No pertinent family history.  Social History   Tobacco Use  . Smoking status: Never Smoker  . Smokeless tobacco: Never Used  Substance Use Topics  . Alcohol use: No  . Drug use: No    Allergies: No Known Allergies  Medications Prior to Admission  Medication Sig Dispense Refill Last Dose  . Prenatal Vit-Fe Fumarate-FA (MULTIVITAMIN-PRENATAL) 27-0.8 MG TABS tablet Take 1 tablet by mouth daily at 12 noon.   03/18/2019 at Unknown time  . acetaminophen (TYLENOL) 325 MG tablet Take 2 tablets (650 mg  total) by mouth every 6 (six) hours as needed for mild pain (or temp > 100). (Patient not taking: Reported on 07/04/2018)   Not Taking  . metoCLOPramide (REGLAN) 10 MG tablet Take 1 tablet (10 mg total) by mouth every 8 (eight) hours as needed for nausea. 30 tablet 0 Unknown at Unknown time    Review of Systems  All other systems reviewed and are negative.  Physical Exam   Blood pressure 114/83, pulse 68, temperature 97.8 F (36.6 C), temperature source Oral, resp. rate 17, weight 74 kg, last menstrual period 12/31/2018.  Physical Exam  Constitutional: She is oriented to person, place, and time. She appears well-developed and well-nourished. No distress.  HENT:  Head: Normocephalic and atraumatic.  Eyes: Pupils are equal, round, and reactive to light. Conjunctivae and EOM are normal. Right eye exhibits no discharge. Left eye exhibits no discharge. No scleral icterus.  Cardiovascular: Normal rate and regular rhythm.  Respiratory: Effort normal. No respiratory distress.  GI: Soft. She exhibits no mass. There is abdominal tenderness (very mild tenderness to deep palpation of LLQ and suprapubic regions). There is no rebound and no guarding.  Genitourinary:    Genitourinary Comments: Speculum exam with small amount of milky white discharge. No blood in vaginal vault or on vulva. External genitalia normal appearing   Neurological: She is alert and oriented to person, place, and time.  Skin: She is not diaphoretic.  Psychiatric: She has a normal mood and affect. Her behavior is normal. Judgment and thought content normal.  MAU Course  Procedures  MDM Patient reports pink tinged color of toilet paper Exam not significant for bleeding However, wet prep positive for clue cells Doppler tones 160s confirming viable IUP  Assessment and Plan  Abdominal pain affecting pregnancy - doppler tones WNL - no concern for vaginal bleeding or loss - will treat for bacterial vaginosis - return  precautions discussed - UA positive for moderate leukocytes, but patient also has 21-50 squamous cells. - Will send for culture to rule out bacteruria - encourage patient to follow up at next ob visit or sooner PRN   Gwenevere Abbot 03/18/2019, 10:03 PM

## 2019-03-21 LAB — CULTURE, OB URINE: Culture: >=100000 — AB

## 2019-03-23 LAB — OB RESULTS CONSOLE HIV ANTIBODY (ROUTINE TESTING): HIV: NONREACTIVE

## 2019-03-23 LAB — OB RESULTS CONSOLE RPR: RPR: NONREACTIVE

## 2019-03-23 LAB — OB RESULTS CONSOLE RUBELLA ANTIBODY, IGM: Rubella: IMMUNE

## 2019-03-23 LAB — OB RESULTS CONSOLE GC/CHLAMYDIA
Chlamydia: NEGATIVE
Gonorrhea: NEGATIVE

## 2019-03-23 LAB — OB RESULTS CONSOLE ANTIBODY SCREEN: Antibody Screen: NEGATIVE

## 2019-03-23 LAB — OB RESULTS CONSOLE HEPATITIS B SURFACE ANTIGEN: Hepatitis B Surface Ag: NEGATIVE

## 2019-04-15 ENCOUNTER — Other Ambulatory Visit (HOSPITAL_COMMUNITY): Payer: Self-pay

## 2019-04-16 ENCOUNTER — Other Ambulatory Visit (HOSPITAL_COMMUNITY): Payer: Self-pay | Admitting: Family Medicine

## 2019-04-17 ENCOUNTER — Other Ambulatory Visit (HOSPITAL_COMMUNITY): Payer: Self-pay | Admitting: Family Medicine

## 2019-04-17 DIAGNOSIS — Z361 Encounter for antenatal screening for raised alphafetoprotein level: Secondary | ICD-10-CM

## 2019-05-04 ENCOUNTER — Ambulatory Visit (HOSPITAL_COMMUNITY): Payer: Self-pay

## 2019-05-04 ENCOUNTER — Other Ambulatory Visit (HOSPITAL_COMMUNITY): Payer: Self-pay | Admitting: *Deleted

## 2019-05-04 ENCOUNTER — Other Ambulatory Visit: Payer: Self-pay

## 2019-05-04 ENCOUNTER — Encounter (HOSPITAL_COMMUNITY): Payer: Self-pay

## 2019-05-04 ENCOUNTER — Ambulatory Visit (HOSPITAL_COMMUNITY): Payer: Self-pay | Admitting: *Deleted

## 2019-05-04 ENCOUNTER — Ambulatory Visit (HOSPITAL_COMMUNITY)
Admission: RE | Admit: 2019-05-04 | Discharge: 2019-05-04 | Disposition: A | Discharge status: Home / Self Care | Payer: Self-pay | Source: Ambulatory Visit | Attending: Obstetrics and Gynecology | Admitting: Obstetrics and Gynecology

## 2019-05-04 ENCOUNTER — Other Ambulatory Visit (HOSPITAL_COMMUNITY): Payer: Self-pay | Admitting: Family Medicine

## 2019-05-04 VITALS — Temp 98.6°F

## 2019-05-04 DIAGNOSIS — O0990 Supervision of high risk pregnancy, unspecified, unspecified trimester: Secondary | ICD-10-CM

## 2019-05-04 DIAGNOSIS — Z361 Encounter for antenatal screening for raised alphafetoprotein level: Secondary | ICD-10-CM | POA: Insufficient documentation

## 2019-05-04 DIAGNOSIS — Z3A19 19 weeks gestation of pregnancy: Secondary | ICD-10-CM

## 2019-05-04 DIAGNOSIS — Z363 Encounter for antenatal screening for malformations: Secondary | ICD-10-CM

## 2019-05-04 DIAGNOSIS — O283 Abnormal ultrasonic finding on antenatal screening of mother: Secondary | ICD-10-CM

## 2019-05-04 DIAGNOSIS — R772 Abnormality of alphafetoprotein: Secondary | ICD-10-CM

## 2019-05-11 ENCOUNTER — Other Ambulatory Visit (HOSPITAL_COMMUNITY): Payer: Self-pay | Admitting: Obstetrics and Gynecology

## 2019-05-13 ENCOUNTER — Telehealth (HOSPITAL_COMMUNITY): Payer: Self-pay | Admitting: *Deleted

## 2019-05-13 NOTE — Telephone Encounter (Signed)
Spanish interpreter [employer] assisted, calling with panorama results, left message for patient to return call.

## 2019-05-13 NOTE — Telephone Encounter (Signed)
No answer

## 2019-05-14 ENCOUNTER — Telehealth (HOSPITAL_COMMUNITY): Payer: Self-pay | Admitting: *Deleted

## 2019-06-02 ENCOUNTER — Encounter: Payer: Self-pay | Admitting: Advanced Practice Midwife

## 2019-06-02 DIAGNOSIS — U071 COVID-19: Secondary | ICD-10-CM | POA: Insufficient documentation

## 2019-06-16 ENCOUNTER — Ambulatory Visit (HOSPITAL_COMMUNITY): Payer: Self-pay

## 2019-06-16 ENCOUNTER — Encounter (HOSPITAL_COMMUNITY): Payer: Self-pay

## 2019-06-24 ENCOUNTER — Other Ambulatory Visit: Payer: Self-pay

## 2019-06-24 ENCOUNTER — Ambulatory Visit (HOSPITAL_COMMUNITY)
Admission: RE | Admit: 2019-06-24 | Discharge: 2019-06-24 | Disposition: A | Discharge status: Home / Self Care | Payer: Self-pay | Source: Ambulatory Visit | Attending: Obstetrics and Gynecology | Admitting: Obstetrics and Gynecology

## 2019-06-24 ENCOUNTER — Ambulatory Visit (HOSPITAL_COMMUNITY): Payer: Self-pay | Admitting: *Deleted

## 2019-06-24 ENCOUNTER — Encounter (HOSPITAL_COMMUNITY): Payer: Self-pay

## 2019-06-24 VITALS — BP 106/68 | HR 77 | Temp 98.6°F

## 2019-06-24 DIAGNOSIS — R772 Abnormality of alphafetoprotein: Secondary | ICD-10-CM | POA: Insufficient documentation

## 2019-06-24 DIAGNOSIS — Z362 Encounter for other antenatal screening follow-up: Secondary | ICD-10-CM | POA: Insufficient documentation

## 2019-06-24 DIAGNOSIS — O283 Abnormal ultrasonic finding on antenatal screening of mother: Secondary | ICD-10-CM

## 2019-06-24 DIAGNOSIS — Z3A26 26 weeks gestation of pregnancy: Secondary | ICD-10-CM

## 2019-06-24 DIAGNOSIS — O289 Unspecified abnormal findings on antenatal screening of mother: Secondary | ICD-10-CM

## 2019-06-25 ENCOUNTER — Other Ambulatory Visit (HOSPITAL_COMMUNITY): Payer: Self-pay | Admitting: *Deleted

## 2019-06-25 DIAGNOSIS — R772 Abnormality of alphafetoprotein: Secondary | ICD-10-CM

## 2019-07-08 ENCOUNTER — Other Ambulatory Visit: Payer: Self-pay

## 2019-07-08 ENCOUNTER — Encounter (HOSPITAL_COMMUNITY): Payer: Self-pay | Admitting: *Deleted

## 2019-07-08 ENCOUNTER — Inpatient Hospital Stay (HOSPITAL_COMMUNITY)
Admission: AD | Admit: 2019-07-08 | Discharge: 2019-07-08 | Disposition: A | Discharge status: Home / Self Care | Payer: Self-pay | Attending: Obstetrics and Gynecology | Admitting: Obstetrics and Gynecology

## 2019-07-08 DIAGNOSIS — Z3A28 28 weeks gestation of pregnancy: Secondary | ICD-10-CM | POA: Insufficient documentation

## 2019-07-08 DIAGNOSIS — O26893 Other specified pregnancy related conditions, third trimester: Secondary | ICD-10-CM | POA: Insufficient documentation

## 2019-07-08 DIAGNOSIS — O4703 False labor before 37 completed weeks of gestation, third trimester: Secondary | ICD-10-CM | POA: Insufficient documentation

## 2019-07-08 DIAGNOSIS — R109 Unspecified abdominal pain: Secondary | ICD-10-CM | POA: Insufficient documentation

## 2019-07-08 LAB — URINALYSIS, ROUTINE W REFLEX MICROSCOPIC
Bacteria, UA: NONE SEEN
Bilirubin Urine: NEGATIVE
Glucose, UA: NEGATIVE mg/dL
Hgb urine dipstick: NEGATIVE
Ketones, ur: NEGATIVE mg/dL
Nitrite: NEGATIVE
Protein, ur: NEGATIVE mg/dL
Specific Gravity, Urine: 1.018 (ref 1.005–1.030)
pH: 6 (ref 5.0–8.0)

## 2019-07-08 MED ORDER — NIFEDIPINE 10 MG PO CAPS
10.0000 mg | ORAL_CAPSULE | ORAL | Status: DC | PRN
  Administered 2019-07-08: 10 mg via ORAL
  Filled 2019-07-08: qty 1

## 2019-07-08 NOTE — MAU Note (Signed)
Pt c/o mild contractions started this morning and became stronger throughout the day. Noticed a scant amt of red blood on tissue after wiping. She says she noticed her breathing gets faster when she feels the contractions and reports she coughed twice today. Reports her baby is moving less than usual today.

## 2019-07-08 NOTE — MAU Provider Note (Signed)
Chief Complaint:  Contractions   First Provider Initiated Contact with Patient 07/08/19 2102     * Spanish interpreter used for this visit*  HPI: Amy Lloyd is a 33 y.o. G3P2002 at 4075w2d who presents to maternity admissions reporting abdominal pain. Symptoms started this morning. Has become more frequent & painful throughout the day. Feels them every 5 minutes. Also has noticed dark red blood on toilet paper when she wipes. Does not have to wear a pad & not passing clots. Denies dysuria, n/v/d, vaginal discharge/irritation, or recent intercourse. Had 3 "small" bottles of water today. Good fetal movement.  Location: abdomen Quality: cramping Severity: 8/10 in pain scale Duration: 1 day Timing: intermittent Modifying factors: none Associated signs and symptoms: vaginal bleeding   Past Medical History:  Diagnosis Date  . Gallbladder attack    OB History  Gravida Para Term Preterm AB Living  3 2 2     2   SAB TAB Ectopic Multiple Live Births          2    # Outcome Date GA Lbr Len/2nd Weight Sex Delivery Anes PTL Lv  3 Current           2 Term 05/15/12 9575w1d 12:42 / 00:21 2951 g F Vag-Spont EPI  LIV     Birth Comments: na  1 Term 11/13/04    Rolm BaptiseM Vag-Spont   LIV   Past Surgical History:  Procedure Laterality Date  . APPENDECTOMY  09/26/2015   "lap"  . CHOLECYSTECTOMY N/A 11/12/2017   Procedure: LAPAROSCOPIC CHOLECYSTECTOMY;  Surgeon: Emelia LoronWakefield, Matthew, MD;  [facility];  Service: General;  Laterality: N/A;  . LAPAROSCOPIC APPENDECTOMY N/A 09/26/2015   Procedure: APPENDECTOMY LAPAROSCOPIC;  Surgeon: Emelia LoronMatthew Wakefield, MD;  [facility];  Service: General;  Laterality: N/A;   History reviewed. No pertinent family history. Social History   Tobacco Use  . Smoking status: Never Smoker  . Smokeless tobacco: Never Used  Substance Use Topics  . Alcohol use: No  . Drug use: No   No Known Allergies No medications prior to admission.    I have reviewed  patient's Past Medical Hx, Surgical Hx, Family Hx, Social Hx, medications and allergies.   ROS:  Review of Systems  Constitutional: Negative.   Gastrointestinal: Positive for abdominal pain. Negative for constipation, diarrhea, nausea and vomiting.  Genitourinary: Positive for vaginal bleeding. Negative for dysuria and vaginal discharge.    Physical Exam   Patient Vitals for the past 24 hrs:  BP Temp Temp src Pulse Resp Height Weight  07/08/19 2158 115/76 - - 74 - - -  07/08/19 2040 115/69 98.2 F (36.8 C) Oral 77 16 - -  07/08/19 2033 - - - - - 5' (1.524 m) 77.1 kg    Constitutional: Well-developed, well-nourished female in no acute distress.  Cardiovascular: normal rate & rhythm, no murmur Respiratory: normal effort, lung sounds clear throughout GI: Abd soft, non-tender, gravid appropriate for gestational age. Pos BS x 4 MS: Extremities nontender, no edema, normal ROM Neurologic: Alert and oriented x 4.  GU:      Pelvic: NEFG, physiologic discharge, no blood. Cervix friable.   Dilation: Closed Effacement (%): Thick Cervical Position: Posterior Station: -3 Exam by:: Judeth HornErin Ariane Ditullio NP  NST:  Baseline: 145 bpm, Variability: Good {> 6 bpm), Accelerations: Non-reactive but appropriate for gestational age, Decelerations: Absent and uterine irritability   Labs: Results for orders placed or performed during the hospital encounter of 07/08/19 (from the past 24 hour(s))  Urinalysis,  Routine w reflex microscopic     Status: Abnormal   Collection Time: 07/08/19  8:35 PM  Result Value Ref Range   Color, Urine YELLOW YELLOW   APPearance HAZY (A) CLEAR   Specific Gravity, Urine 1.018 1.005 - 1.030   pH 6.0 5.0 - 8.0   Glucose, UA NEGATIVE NEGATIVE mg/dL   Hgb urine dipstick NEGATIVE NEGATIVE   Bilirubin Urine NEGATIVE NEGATIVE   Ketones, ur NEGATIVE NEGATIVE mg/dL   Protein, ur NEGATIVE NEGATIVE mg/dL   Nitrite NEGATIVE NEGATIVE   Leukocytes,Ua LARGE (A) NEGATIVE   RBC / HPF 11-20  0 - 5 RBC/hpf   WBC, UA 21-50 0 - 5 WBC/hpf   Bacteria, UA NONE SEEN NONE SEEN   Squamous Epithelial / LPF 11-20 0 - 5   Mucus PRESENT     Imaging:  No results found.  MAU Course: Orders Placed This Encounter  Procedures  . Culture, OB Urine  . Urinalysis, Routine w reflex microscopic  . Discharge patient   Meds ordered this encounter  Medications  . NIFEdipine (PROCARDIA) capsule 10 mg    MDM: Some UI on monitor. Cervix closed/thick. Symptoms improved with PO fluids & 1 dose of procardia 10 mg.   Spec exam performed for vaginal bleeding. No blood noted but cervix is friable. Pt is rh positive.   Assessment: 1. Preterm uterine contractions in third trimester, antepartum   2. [redacted] weeks gestation of pregnancy     Plan: Discharge home in stable condition.  Preterm Labor precautions and fetal kick counts Follow-up Information    Department, [facility] Follow up.   Contact information: [address] [place] [address] [phone]        [facility] Follow up.   Specialty: Obstetrics and Gynecology Why: return for worsening symptoms Contact information: [address] [id number] [place] [phone]          Allergies as of 07/08/2019   No Known Allergies     Medication List    STOP taking these medications   acetaminophen 325 MG tablet Commonly known as: TYLENOL   metoCLOPramide 10 MG tablet Commonly known as: REGLAN   metroNIDAZOLE 500 MG tablet Commonly known as: Flagyl     TAKE these medications   multivitamin-prenatal 27-0.8 MG Tabs tablet Take 1 tablet by mouth daily at 12 noon.       Jorje Guild, NP 07/09/2019 12:15 AM

## 2019-07-08 NOTE — Discharge Instructions (Signed)
Información sobre parto y trabajo de parto prematuros °(Preterm Labor and Birth Information) °La duración de un embarazo normal es de 39 a 41 semanas. Se llama trabajo de parto prematuro cuando se inicia antes de las 37 semanas de embarazo. °¿CUÁLES SON LOS FACTORES DE RIESGO DEL TRABAJO DE PARTO PREMATURO? °Existen mayores probabilidades de trabajo de parto prematuro en mujeres con las siguientes características: °· Tienen ciertas infecciones durante el embarazo, como infección de vejiga, infección de transmisión sexual o infección en el útero (corioamnionitis). °· Tienen el cuello del útero más corto que lo normal. °· Tuvieron trabajo de parto prematuro anteriormente. °· Se sometieron a una cirugía en el cuello del útero. °· Son menores de 17 años o mayores de 35 años de edad. °· Son afroamericanas. °· Están embarazadas de mellizos o de varios bebés (gestación múltiple). °· Consumen drogas o fuman mientras están embarazadas. °· No aumentan de peso lo suficiente durante el embarazo. °· Se embarazan poco después de haber estado embarazadas. °¿CUÁLES SON LOS SÍNTOMAS DEL TRABAJO DE PARTO PREMATURO? °Los síntomas del trabajo de parto prematuro incluyen lo siguiente: °· Calambres similares a los que ocurren durante el período menstrual. Los calambres pueden presentarse con diarrea. °· Dolor en el abdomen o en la parte inferior de la espalda. °· Contracciones uterinas regulares que se pueden sentir como una presión en el abdomen. °· Una sensación de mayor presión en la pelvis. °· Aumento de la secreción de moco acuoso o sanguinolento en la vagina. °· Rotura de bolsa (rotura de saco amniótico). °¿POR QUÉ ES IMPORTANTE RECONOCER LOS SIGNOS DEL TRABAJO DE PARTO PREMATURO? °Es importante reconocer los signos del trabajo de parto prematuro porque los bebés que nacen de forma prematura pueden no estar completamente desarrollados. Por lo tanto, pueden correr mayor riesgo de lo siguiente: °· Problemas cardíacos y pulmonares a  largo plazo (crónicos). °· Inmediatamente después del parto, dificultades para regular los sistemas corporales, que incluyen glucemia, temperatura corporal, frecuencia cardíaca y frecuencia respiratoria. °· Hemorragia cerebral. °· Parálisis cerebral. °· Dificultades en el aprendizaje. °· Muerte. °Estos riesgos son mucho mayores para bebés que nacen antes de las 34 semanas de embarazo. °¿CÓMO SE TRATA EL TRABAJO DE PARTO PREMATURO? °El tratamiento depende del tiempo de su embarazo, su afección y la salud de su bebé. Puede incluir lo siguiente: °· Tener un punto (sutura) en el cuello del útero para evitar que este se abra demasiado pronto (cerclaje). °· Tomar medicamentos, por ejemplo: °? Medicamentos hormonales. Estos se pueden administrar de forma temprana en el embarazo para ayudar a mantener el embarazo. °? Medicamentos para detener las contracciones. °? Medicamentos que ayudan a madurar los pulmones del bebé. Estos se pueden recetar si el riesgo de parto es alto. °? Medicamentos para evitar que el bebé desarrolle parálisis cerebral. °Si el trabajo de parto de inicia antes de las 34 semanas de embarazo, es posible que deba hospitalizarse. °¿QUÉ DEBO HACER SI CREO QUE ESTOY EN TRABAJO DE PARTO PREMATURO? °Si cree que está iniciando trabajo de parto prematuro, llame al médico de inmediato. °¿CÓMO PUEDO EVITAR EL TRABAJO DE PARTO PREMATURO EN FUTUROS EMBARAZOS? °Para aumentar las probabilidades de tener un embarazo a término, tenga en cuenta lo siguiente: °· No consuma ningún producto que contenga tabaco, lo que incluye cigarrillos, tabaco de mascar y cigarrillos electrónicos. Si necesita ayuda para dejar de fumar, consulte al médico. °· No consuma drogas ni medicamentos que no sean recetados durante el embarazo. °· Hable con el médico antes de tomar suplementos a base de hierbas aunque los haya estado tomando   periódicamente. °· Asegúrese de llegar a un peso saludable durante el embarazo. °· Tenga cuidado con las  infecciones. Si cree que puede tener una infección, consulte al médico para que la revisen. °· Asegúrese de informarle al médico si ha tenido trabajo de parto prematuro antes. °Esta información no tiene como fin reemplazar el consejo del médico. Asegúrese de hacerle al médico cualquier pregunta que tenga. °Document Released: 03/11/2008 Document Revised: 08/09/2016 Document Reviewed: 04/25/2016 °Elsevier Patient Education © 2020 [employer] ° °

## 2019-07-09 NOTE — Telephone Encounter (Signed)
Telephone call  

## 2019-07-10 LAB — CULTURE, OB URINE: Culture: <10000 — AB

## 2019-07-27 ENCOUNTER — Ambulatory Visit (HOSPITAL_COMMUNITY): Payer: Self-pay | Admitting: *Deleted

## 2019-07-27 ENCOUNTER — Other Ambulatory Visit: Payer: Self-pay

## 2019-07-27 ENCOUNTER — Other Ambulatory Visit (HOSPITAL_COMMUNITY): Payer: Self-pay | Admitting: *Deleted

## 2019-07-27 ENCOUNTER — Ambulatory Visit (HOSPITAL_COMMUNITY)
Admission: RE | Admit: 2019-07-27 | Discharge: 2019-07-27 | Disposition: A | Discharge status: Home / Self Care | Payer: Self-pay | Source: Ambulatory Visit | Attending: Obstetrics and Gynecology | Admitting: Obstetrics and Gynecology

## 2019-07-27 ENCOUNTER — Encounter (HOSPITAL_COMMUNITY): Payer: Self-pay

## 2019-07-27 VITALS — BP 94/66 | HR 76 | Temp 98.4°F

## 2019-07-27 DIAGNOSIS — Z3A31 31 weeks gestation of pregnancy: Secondary | ICD-10-CM

## 2019-07-27 DIAGNOSIS — Z362 Encounter for other antenatal screening follow-up: Secondary | ICD-10-CM

## 2019-07-27 DIAGNOSIS — R772 Abnormality of alphafetoprotein: Secondary | ICD-10-CM | POA: Insufficient documentation

## 2019-07-27 DIAGNOSIS — O289 Unspecified abnormal findings on antenatal screening of mother: Secondary | ICD-10-CM

## 2019-07-27 DIAGNOSIS — O283 Abnormal ultrasonic finding on antenatal screening of mother: Secondary | ICD-10-CM

## 2019-07-27 NOTE — Progress Notes (Signed)
Reports leaking pinkish/watery fluid once this morning,denies pain, +FM.

## 2019-08-01 IMAGING — US US ABDOMEN COMPLETE
1 series · 13 of 25 positions shown · non-contrast
Comparison: Abdominal ultrasound 07/29/2016. CT abdomen and pelvis
09/25/2015.

CLINICAL DATA: Right upper quadrant pain with nausea and vomiting
for 3 days.

EXAM:
ABDOMEN ULTRASOUND COMPLETE

[Series 1: us abdomen complete · 0.22mm/px · 13 of 95 slices shown]
[im 1/95]
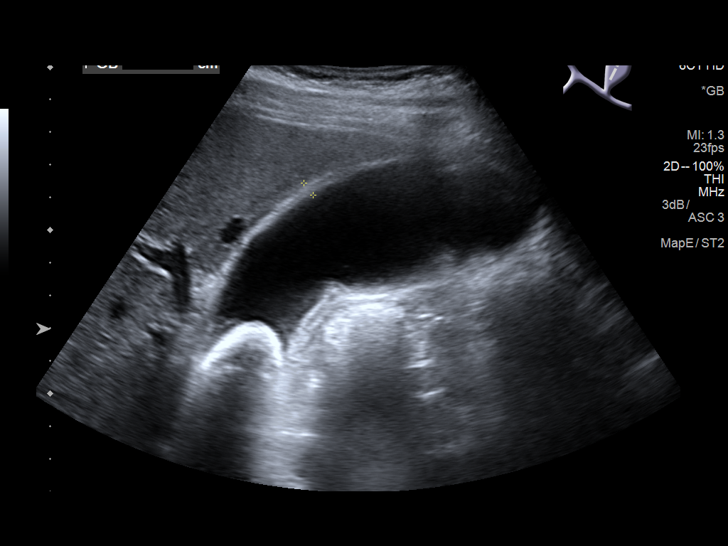
[im 8/95]
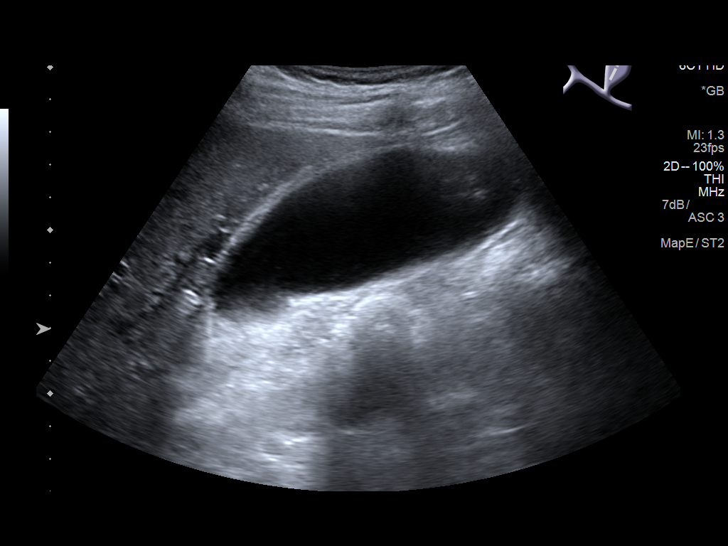
[im 16/95]
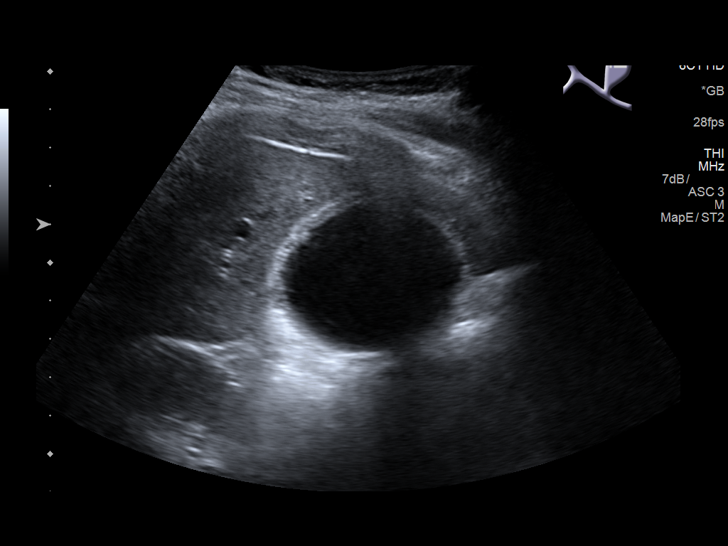
[im 24/95]
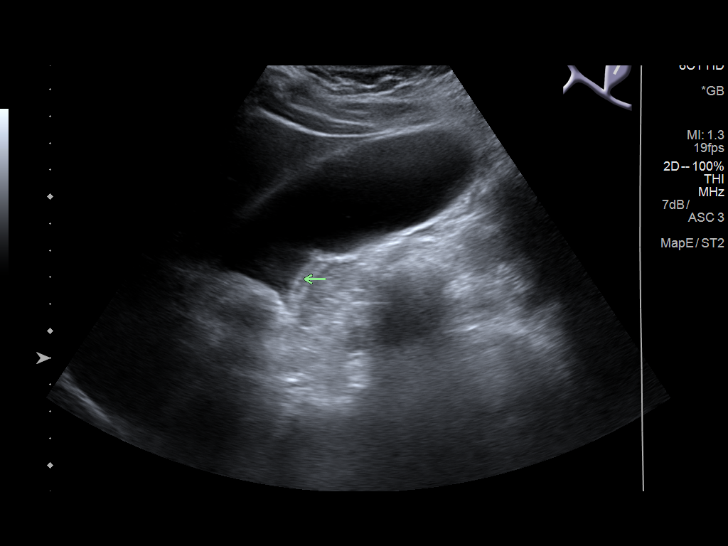
[im 32/95]
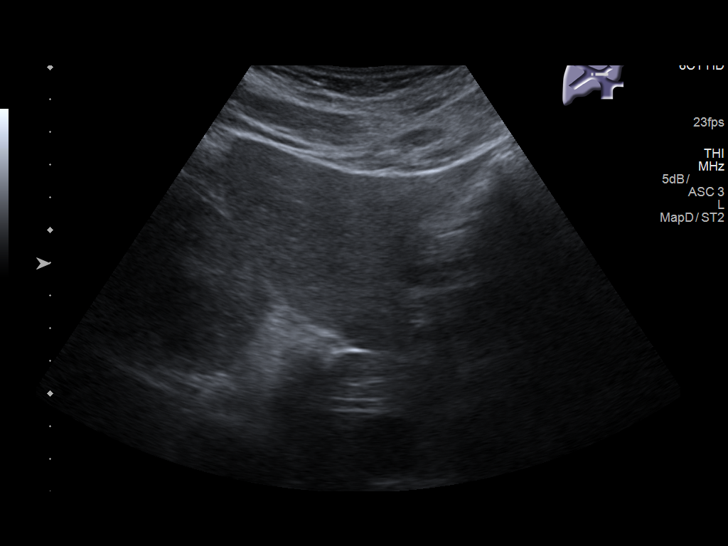
[im 40/95]
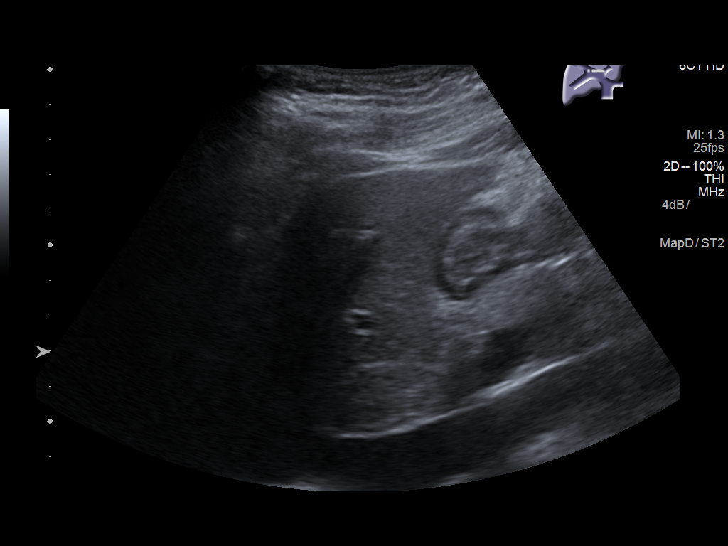
[im 48/95]
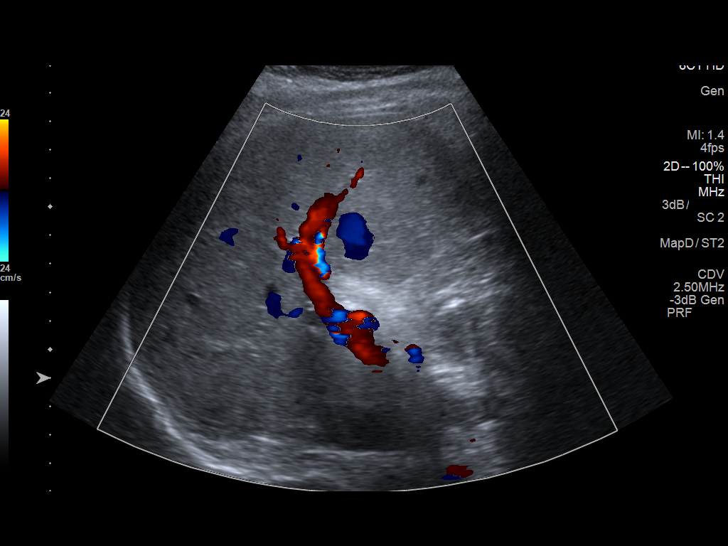
[im 55/95]
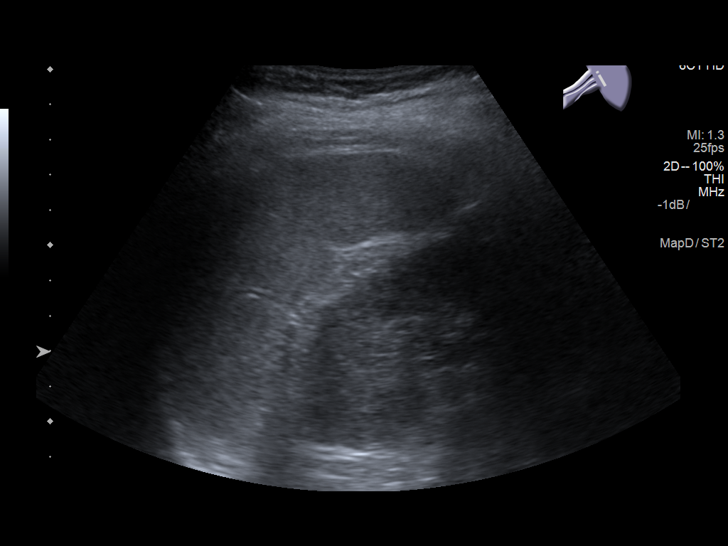
[im 63/95]
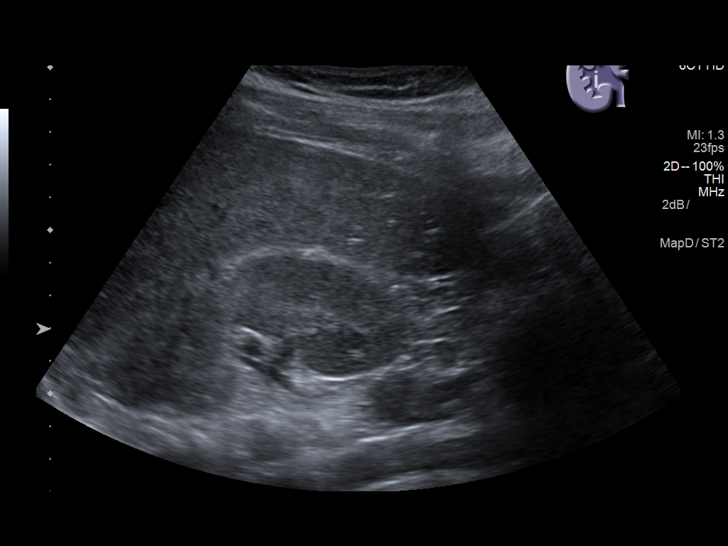
[im 71/95]
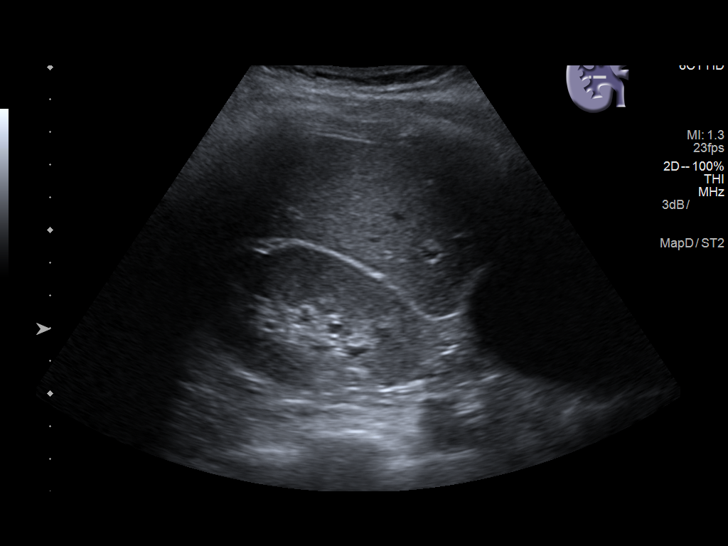
[im 79/95]
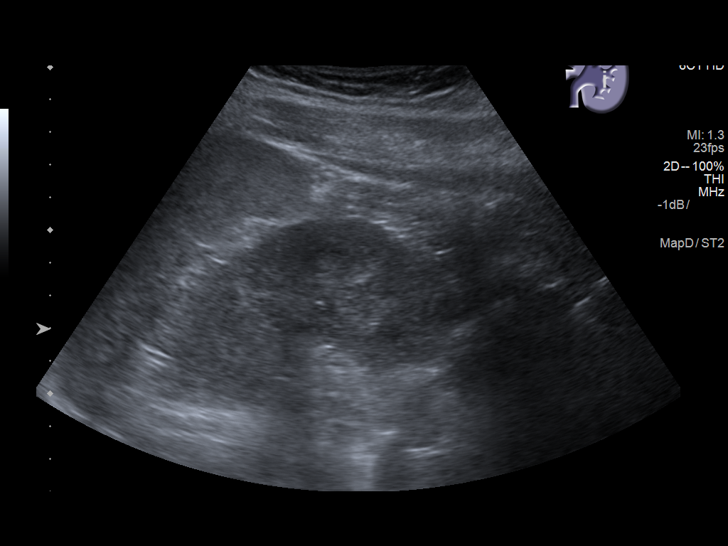
[im 87/95]
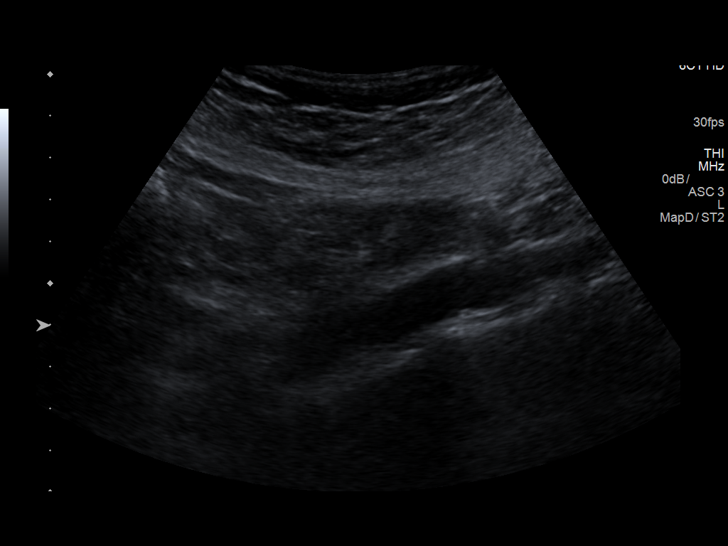
[im 95/95]
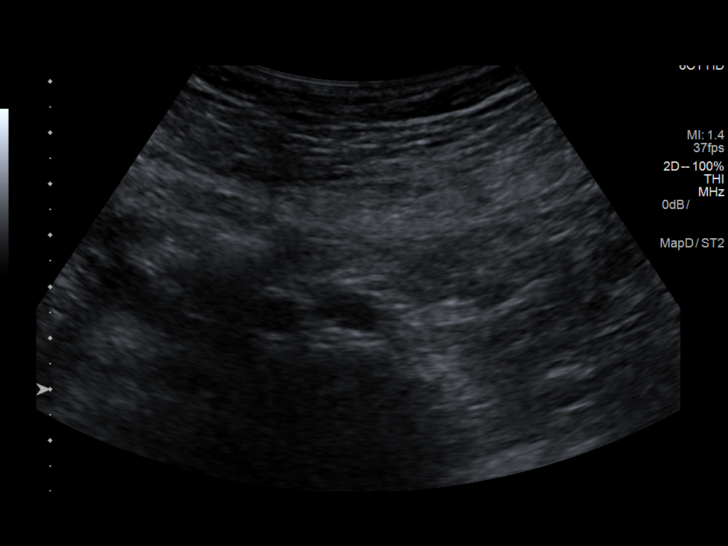

[13 of 25 positions shown; findings below may reference images not displayed]

FINDINGS: Gallbladder: The gallbladder wall is mildly thickened at 0.5 cm and
there is a small volume of pericholecystic fluid. A gallstone
measuring approximately 2.7 by 4.3 cm is impacted in the gallbladder
neck. Sonographer reports positive Murphy's sign.

Common bile duct: Diameter: 0.4 cm

Liver: No focal lesion identified. Within normal limits in
parenchymal echogenicity. Portal vein is patent on color Doppler
imaging with normal direction of blood flow towards the liver.

IVC: No abnormality visualized.

Pancreas: Visualized portion unremarkable.

Spleen: Size and appearance within normal limits.

Right Kidney: Length: 10.3 cm. Echogenicity within normal limits. No
mass or hydronephrosis visualized.

Left Kidney: Length: 10.9 cm. Echogenicity within normal limits. No
mass or hydronephrosis visualized.

Abdominal aorta: No aneurysm visualized.

Other findings: None.
IMPRESSION: Findings most consistent with acute cholecystitis with a 2.7 x
cm stone impacted in the gallbladder neck.

## 2019-08-21 ENCOUNTER — Encounter (HOSPITAL_COMMUNITY): Payer: Self-pay | Admitting: *Deleted

## 2019-08-21 ENCOUNTER — Inpatient Hospital Stay (HOSPITAL_COMMUNITY)
Admission: AD | Admit: 2019-08-21 | Discharge: 2019-08-21 | Disposition: A | Discharge status: Home / Self Care | Payer: Self-pay | Attending: Family Medicine | Admitting: Family Medicine

## 2019-08-21 ENCOUNTER — Other Ambulatory Visit: Payer: Self-pay

## 2019-08-21 DIAGNOSIS — R0602 Shortness of breath: Secondary | ICD-10-CM | POA: Insufficient documentation

## 2019-08-21 DIAGNOSIS — Z3A34 34 weeks gestation of pregnancy: Secondary | ICD-10-CM | POA: Insufficient documentation

## 2019-08-21 DIAGNOSIS — R002 Palpitations: Secondary | ICD-10-CM | POA: Insufficient documentation

## 2019-08-21 DIAGNOSIS — O26893 Other specified pregnancy related conditions, third trimester: Secondary | ICD-10-CM | POA: Insufficient documentation

## 2019-08-21 DIAGNOSIS — D649 Anemia, unspecified: Secondary | ICD-10-CM | POA: Insufficient documentation

## 2019-08-21 DIAGNOSIS — O99013 Anemia complicating pregnancy, third trimester: Secondary | ICD-10-CM | POA: Insufficient documentation

## 2019-08-21 LAB — URINALYSIS, ROUTINE W REFLEX MICROSCOPIC
Bacteria, UA: NONE SEEN
Bilirubin Urine: NEGATIVE
Glucose, UA: NEGATIVE mg/dL
Hgb urine dipstick: NEGATIVE
Ketones, ur: NEGATIVE mg/dL
Nitrite: NEGATIVE
Protein, ur: NEGATIVE mg/dL
Specific Gravity, Urine: 1.021 (ref 1.005–1.030)
pH: 6 (ref 5.0–8.0)

## 2019-08-21 LAB — HEMOGLOBIN AND HEMATOCRIT, BLOOD
HCT: 31.8 % — ABNORMAL LOW (ref 36.0–46.0)
Hemoglobin: 10.1 g/dL — ABNORMAL LOW (ref 12.0–15.0)

## 2019-08-21 MED ORDER — FERROUS SULFATE 325 (65 FE) MG PO TABS: 325.0000 mg | ORAL_TABLET | Freq: Two times a day (BID) | ORAL | 1 refills | Status: DC

## 2019-08-21 NOTE — Discharge Instructions (Signed)
[name] y anemia Pregnancy and Anemia  La anemia es una enfermedad en la que la concentracin de glbulos rojos o el nivel de hemoglobina en la sangre estn por debajo de lo normal. La hemoglobina es la sustancia de los glbulos rojos que lleva oxgeno a los tejidos de todo el cuerpo. La anemia hace que los tejidos no reciban la cantidad suficiente de oxgeno. La anemia es frecuente durante el embarazo, ya que el organismo de la mujer necesita un mayor volumen de sangre y clulas sanguneas para proporcionar nutricin al feto. El feto necesita hierro y cido flico mientras se desarrolla. Por este motivo, es posible que su cuerpo no produzca suficientes glbulos rojos. Tambin, durante el [place], la parte lquida de la sangre (plasma) [name], aproximadamente, de un 30% a un 50%, y los glbulos rojos, solo en un 20%. Esto disminuye la concentracin de glbulos rojos y crea una situacin natural similar a la anemia. Cules son las causas? La causa ms frecuente de anemia durante el embarazo es no tener hierro suficiente en el cuerpo para formar glbulos rojos (anemia por deficiencia de hierro). Algunas otras causas son las siguientes:  Dficit de cido flico.  Dficit de vitamina B12.  Ciertos medicamentos recetados o de [employer].  Ciertas enfermedades o infecciones que destruyen los glbulos rojos.  Un recuento bajo de plaquetas y hemorragia causada por anticuerpos de la sangre materna que pasan al feto a travs de la placenta. Cules son los signos o los sntomas? Cuando la anemia es leve puede pasar inadvertida. Si se agrava, los sntomas pueden ser los siguientes:  Sensacin de cansancio (fatiga).  Falta de aire, especialmente al realizar actividad fsica.  Debilidad.  Desmayos.  Palidez.  Dolores de [place].  Latidos cardacos rpidos o irregulares (palpitaciones).  Mareos. Cmo se diagnostica? Esta afeccin se puede diagnosticar en funcin de lo siguiente:  Un  examen fsico y antecedentes mdicos.  Anlisis de [place]. Cmo se trata? El tratamiento de la anemia durante el embarazo depende de la causa. [name] se incluyen las siguientes:  Cambios en la dieta.  Suplementos de hierro, vitamina Z61 o cido flico.  Transfusin de sangre. Esto podra ser necesario en casos graves de anemia.  Hospitalizacin. Podra ser [name] casos de prdida de sangre o anemia graves. Siga estas indicaciones en su casa:  Siga las recomendaciones del nutricionista o del mdico respecto de los cambios en la dieta.  Aumente su ingesta de vitamina C. Esto har que el estmago absorba ms hierro. Algunos alimentos que tienen un nivel alto de vitaminaC: ? Naranjas. ? Pimientos. ? Tomates. ? Mangos.  Siga una dieta rica en hierro. Esto incluye alimentos como: ? Hgado. ? Carne de vaca. ? Huevos. ? Cereales integrales. ? [place]. ? Frutas pasas.  [name] hierro y las vitaminas como se lo haya indicado el mdico.  Consuma vegetales de [employer]. Son [name] buena fuente de cido flico.  Concurra a todas las visitas de control como se lo haya indicado el mdico. Esto es importante. Comunquese con un mdico si:  Sufre cefaleas frecuentes o estas duran mucho tiempo.  Tiene un aspecto plido.  Le aparecen moretones con facilidad. Solicite ayuda de inmediato si:  Siente debilidad extrema, falta de aire o dolor en el pecho.  Se siente mareado o tiene dificultad para concentrarse.  Tiene una hemorragia vaginal abundante.  Presenta una erupcin cutnea.  Las heces son alquitranadas, sanguinolentas o negras.  Se desmaya.  Vomita sangre.  Vomita  repetidas veces.  Siente dolor abdominal.  [name] fiebre.  Se deshidrata. Resumen  La anemia es una enfermedad en la que la concentracin de glbulos rojos o el nivel de hemoglobina en la sangre estn por debajo de lo normal.  La anemia es frecuente durante el  Lemmonembarazo, ya que el organismo de la mujer necesita un mayor volumen de sangre y clulas sanguneas para proporcionar nutricin al feto.  La causa ms frecuente de anemia durante el embarazo es no tener hierro suficiente en el cuerpo para formar glbulos rojos (anemia por deficiencia de hierro).  Cuando la anemia es leve puede pasar inadvertida. Si se agrava, los sntomas podran incluir sentirse cansada y dbil. Esta informacin no tiene Theme park managercomo fin reemplazar el consejo del mdico. Asegrese de hacerle al mdico cualquier pregunta que tenga. Document Released: 09/12/2005 Document Revised: 08/15/2017 Document Reviewed: 08/15/2017 Elsevier Patient Education  2020 Elsevier Inc. Evaluacin de los movimientos fetales Fetal Movement Counts Introduccin Nombre del paciente: ________________________________________________ Amy ChapmanFecha de parto estimada: ____________________ Amy BerryQu es una evaluacin de los movimientos fetales?  Una evaluacin de los movimientos fetales es el registro del nmero de veces que siente que el beb se mueve durante un cierto perodo de [place]po. Esto tambin se puede denominar recuento de patadas fetales. Una evaluacin de movimientos fetales se recomienda a todas las embarazadas. Es posible que le indiquen que comience a Development worker, communityevaluar los movimientos fetales desde la semana 28 de [place] Lakeembarazo. Preste atencin cuando sienta que el beb est ms activo. Podr detectar los ciclos en que el beb duerme y est despierto. Tambin podr detectar que ciertas cosas hacen que su beb se mueva ms. Deber realizar una evaluacin de los movimientos fetales en las siguientes situaciones:  Cuando el beb est ms activo habitualmente.  A la Unisys Corporationmisma hora, todos los [place]das. Un buen momento para evaluar los movimientos fetales es cuando est descansando, despus de haber comido y bebido algo. Cmo debo contar los movimientos fetales? 1. Encuentre un lugar tranquilo y cmodo. Sintese o acustese de lado. 2. Anote  la fecha, la hora de inicio y de finalizacin y la cantidad de movimientos que sinti entre esas dos horas. Lleve esta informacin a las visitas de control. 3. Cuente las pataditas, revoloteos, chasquidos, vueltas o pinchazos en un perodo de 2horas. Debe sentir al menos 10movimientos en 2horas. 4. Cuando sienta 10movimientos, puede dejar de contar. 5. Si no siente 10movimientos en 2horas, coma y beba algo. Luego, contine descansando y Industrial/product designercontando durante 1hora. Si siente al menos 4movimientos durante esa hora, puede dejar de contar. Comunquese con un mdico si:  Siente menos de 4movimientos en 2horas.  El beb no se mueve tanto como suele hacerlo. Fecha: ____________ Stevan BornHora de inicio: ____________ Stevan BornHora de finalizacin: ____________ Movimientos: ____________ Franco NonesFecha: ____________ Stevan BornHora de inicio: ____________ Stevan BornHora de finalizacin: ____________ Movimientos: ____________ Franco NonesFecha: ____________ Stevan BornHora de inicio: ____________ Stevan BornHora de finalizacin: ____________ Movimientos: ____________ Franco NonesFecha: ____________ Stevan BornHora de inicio: ____________ Stevan BornHora de finalizacin: ____________ Movimientos: ____________ Franco NonesFecha: ____________ Stevan BornHora de inicio: ____________ Mammie RussianHora de finalizacin: ____________ Movimientos: ____________ Franco NonesFecha: ____________ Stevan BornHora de inicio: ____________ Mammie RussianHora de finalizacin: ____________ Movimientos: ____________ Franco NonesFecha: ____________ Stevan BornHora de inicio: ____________ Mammie RussianHora de finalizacin: ____________ Movimientos: ____________ Franco NonesFecha: ____________ Stevan BornHora de inicio: ____________ Stevan BornHora de finalizacin: ____________ Movimientos: ____________ Franco NonesFecha: ____________ Stevan BornHora de inicio: ____________ Mammie RussianHora de finalizacin: ____________ Movimientos: ____________ Esta informacin no tiene como fin reemplazar el consejo del mdico. Asegrese de hacerle al mdico cualquier pregunta que tenga. Document Released: 03/11/2008 Document Revised: 03/08/2017 Document Reviewed: 01/12/2016 Elsevier Patient Education  2020 Elsevier  Inc. Signos de advertencia durante el embarazo Warning Signs During Pregnancy Generalmente, el embarazo dura unas 40 semanas, a partir Hospital doctordel primer da del ltimo perodo hasta que el beb nace. Se divide en tres fases llamadas trimestres.  El Engineer, maintenanceprimer trimestre se refiere a Lawyerla semana 1 Whole Foodshasta la semana 13 de Hillcrestembarazo.  El segundo trimestre es el comienzo de la semana 14 hasta el final de la semana 27.  El tercer trimestre es el comienzo de la semana 28 hasta el nacimiento del beb. Durante cada trimestre de embarazo, ciertos signos y sntomas pueden indicar un problema. Hable con su mdico acerca de su actual estado de salud y cualquier afeccin mdica que tenga. Asegrese de Anheuser-Buschconocer los sntomas a los que debera estar atenta e informar. De qu modo la afecta a usted?  Signos de Airline pilotadvertencia en el primer trimestre de embarazo Si bien algunos cambios durante el primer trimestre pueden ser incmodos, la mayora no representa un problema grave. Informe al mdico si tiene alguno de los siguientes signos de advertencia durante Financial risk analystel primer trimestre:  No puede comer ni beber sin vomitar, y esto se prolonga durante ms de Civil engineer, contractingun da.  Tiene sangrado o manchado vaginal junto con clicos parecidos a los de Tax adviserla menstruacin.  Tiene diarrea durante ms de Civil engineer, contractingun da.  Tiene fiebre u otros signos de infeccin, como: ? Engineer, miningDolor o ardor al Geographical information systems officerorinar. ? Olor ftido o secrecin vaginal espesa o amarillenta. Signos de advertencia en el segundo trimestre de embarazo A medida que el beb crece y cambia durante el segundo trimestre, hay otros signos y sntomas que pueden indicar un problema. Estos incluyen los siguientes:  Signos y sntomas de infeccin, incluyendo Las Gaviotasfiebre.  Signos o sntomas de un aborto espontneo o un parto prematuro, por ejemplo, contracciones regulares, clicos parecidos a los de la menstruacin o dolor en la parte inferior del abdomen.  Secrecin vaginal acuosa o con sangre o sangrado vaginal  obvio.  Sentir que el corazn late McKinleyfuerte.  Dificultad para respirar.  Nuseas, vmitos o diarrea que dura ms de Civil engineer, contractingun da.  Deseo compulsivo de comer cosas que no son alimentos, tales como arcilla, tiza o Social Circletierra. Esto puede ser un signo de una afeccin mdica muy tratable llamada pica. Ms adelante, en el segundo trimestre, observe si tiene signos y sntomas de una enfermedad grave denominada preeclampsia.Estos incluyen los siguientes:  Cambios en la visin.  Un dolor de cabeza intenso que no se [place]ivia.  Nuseas y vmitos. Tambin es importante observar si el beb deja de moverse o se mueve menos que lo normal durante este trimestre. Signos de Radiographer, therapeuticadvertencia en el tercer trimestre de embarazo A medida que se acerca el tercer trimestre del embarazo, el beb crece y su cuerpo se prepara para el nacimiento. En el tercer trimestre, asegrese de informarle a su mdico si:  Tiene signos y sntomas de infeccin, incluyendo fiebre.  Tiene una hemorragia vaginal abundante.  Nota que su beb se mueve menos que lo habitual o no se mueve.  Tiene nuseas, vmitos o diarrea que dura ms de Civil engineer, contractingun da.  Siente un dolor de cabeza intenso que no se [place].  Tiene cambios en la visin, como ver manchas o tener visin borrosa o ver doble.  Aumenta la hinchazn en sus manos o rostro. De qu modo lo afecta al beb? Durante todo el embarazo, siempre informe cualquier signo de advertencia de un problema al mdico. Esto puede ayudar a prevenir las complicaciones que pueden afectar a su beb, por ejemplo:  Aumento del riesgo de Radio broadcast assistant.  Infecciones que pueden transmitirse al beb.  Aumento del riesgo de muerte fetal. Comunquese con un mdico si:  Tiene cualquier signo de advertencia de un problema para el actual trimestre de su embarazo.  Cualquiera de lo siguiente se aplica a usted durante cualquier trimestre del embarazo: ? Tiene emociones fuertes, como tristeza o ansiedad, que  interfieren con el trabajo o las relaciones personales. ? Se siente insegura en su casa y necesita ayuda para encontrar un lugar seguro para vivir. ? [place] productos de tabaco, alcohol o drogas y [place] para dejar de hacerlo. Solicite ayuda de inmediato si: Tiene signos o sntomas de trabajo de parto antes de las 37 semanas de [place]. Estos incluyen los siguientes:  Contracciones separadas unas de otras por intervalos de 5 minutos o menos, o que aumentan en frecuencia, intensidad o duracin.  Dolor abdominal sbito y agudo o Educational psychologist.  Chorro repentino o goteo constante de lquido proveniente de la vagina. Resumen  Generalmente, el embarazo dura unas 40 semanas, a partir del Social worker del ltimo perodo hasta que el beb nace. Se divide en tres fases llamadas trimestres. Cada trimestre tiene signos de advertencia a los que debe [address]  Siempre informe cualquier signo de advertencia a su mdico para evitar las complicaciones que pueden afectar tanto a usted como a su beb.  Hable con su mdico acerca de su actual estado de salud y cualquier afeccin mdica que tenga. Asegrese de [employer] a los que debera estar atenta e informar. Esta informacin no tiene Theme park manager el consejo del mdico. Asegrese de hacerle al mdico cualquier pregunta que tenga. Document Released: 12/18/2017 Document Revised: 12/18/2017 Document Reviewed: 12/18/2017 Elsevier Patient Education  2020 [employer].

## 2019-08-21 NOTE — MAU Provider Note (Signed)
Chief Complaint:  Shortness of Breath and Palpitations   First Provider Initiated Contact with Patient 08/21/19 1635     *video Spanish interpreter used for this visit*  HPI: Amy Lloyd is a 33 y.o. [id number] at [redacted]w[redacted]d who presents to maternity admissions reporting shortness of breath & palpitations. Symptoms started this morning. Has had intermittent episodes of her heart racing, during these episodes she feels weak & SOB. Symptoms have occurred while sitting but is worse when standing up. Denies sore throat, fever/chills, cough, chest pain, or LOC. Denies abdominal pain, vaginal bleeding, dysuria, LOF, or n/v. Had some diarrhea the last few days but states it was "mild" and resolved today.  Good fetal movement.   Past Medical History:  Diagnosis Date  . Gallbladder attack    OB History  Gravida Para Term Preterm AB Living  3 2 2     2   SAB TAB Ectopic Multiple Live Births          2    # Outcome Date GA Lbr Len/2nd Weight Sex Delivery Anes PTL Lv  3 Current           2 Term 05/15/12 [redacted]w[redacted]d 12:42 / 00:21 2951 g F Vag-Spont EPI  LIV     Birth Comments: na  1 Term 11/13/04    Amy Lloyd   Past Surgical History:  Procedure Laterality Date  . APPENDECTOMY  09/26/2015   "lap"  . CHOLECYSTECTOMY N/A 11/12/2017   Procedure: LAPAROSCOPIC CHOLECYSTECTOMY;  Surgeon: Rolm Bookbinder, MD;  Location: [facility];  Service: General;  Laterality: N/A;  . LAPAROSCOPIC APPENDECTOMY N/A 09/26/2015   Procedure: APPENDECTOMY LAPAROSCOPIC;  Surgeon: Rolm Bookbinder, MD;  Location: [facility];  Service: General;  Laterality: N/A;   History reviewed. No pertinent family history. Social History   Tobacco Use  . Smoking status: Never Smoker  . Smokeless tobacco: Never Used  Substance Use Topics  . Alcohol use: No  . Drug use: No   No Known Allergies Medications Prior to Admission  Medication Sig Dispense Refill Last Dose  . Prenatal Vit-Fe Fumarate-FA (MULTIVITAMIN-PRENATAL) 27-0.8  MG TABS tablet Take 1 tablet by mouth daily at 12 noon.       I have reviewed patient's Past Medical Hx, Surgical Hx, Family Hx, Social Hx, medications and allergies.   ROS:  Review of Systems  Constitutional: Positive for fatigue. Negative for chills, diaphoresis and fever.  Respiratory: Positive for shortness of breath. Negative for cough and wheezing.   Cardiovascular: Positive for palpitations. Negative for chest pain.  Gastrointestinal: Positive for diarrhea. Negative for abdominal pain, nausea and vomiting.  Genitourinary: Negative.   Neurological: Negative for dizziness, syncope and headaches.    Physical Exam   Patient Vitals for the past 24 hrs:  BP Temp Temp src Pulse Resp SpO2 Weight  08/21/19 1837 103/76 - - (!) 111 18 - -  08/21/19 1609 - - - (!) 114 14 100 % -  08/21/19 1607 - - - 93 - 100 % -  08/21/19 1601 - - - - - 99 % -  08/21/19 1557 104/67 98.5 F (36.9 C) Oral 96 20 99 % -  08/21/19 1553 104/66 - - 97 - 99 % 80.5 kg   Orthostatic VS for the past 24 hrs:  BP- Lying Pulse- Lying BP- Sitting Pulse- Sitting BP- Standing at 0 minutes Pulse- Standing at 0 minutes  08/21/19 1752 - - - - 108/68 97  08/21/19 1751 - - 106/63 81 - -  08/21/19 1750 109/67 95 - - - -      Constitutional: Well-developed, well-nourished female in no acute distress.  Cardiovascular: normal rate & rhythm, no murmur Respiratory: normal effort, lung sounds clear throughout GI: Abd soft, non-tender, gravid appropriate for gestational age. Pos BS x 4 MS: Extremities nontender, no edema, normal ROM Neurologic: Alert and oriented x 4.   NST:  Baseline: 145 bpm, Variability: Good {> 6 bpm), Accelerations: Reactive and Decelerations: Absent   Labs: Results for orders placed or performed during the hospital encounter of 08/21/19 (from the past 24 hour(s))  Urinalysis, Routine w reflex microscopic     Status: Abnormal   Collection Time: 08/21/19  4:19 PM  Result Value Ref Range   Color,  Urine YELLOW YELLOW   APPearance HAZY (A) CLEAR   Specific Gravity, Urine 1.021 1.005 - 1.030   pH 6.0 5.0 - 8.0   Glucose, UA NEGATIVE NEGATIVE mg/dL   Hgb urine dipstick NEGATIVE NEGATIVE   Bilirubin Urine NEGATIVE NEGATIVE   Ketones, ur NEGATIVE NEGATIVE mg/dL   Protein, ur NEGATIVE NEGATIVE mg/dL   Nitrite NEGATIVE NEGATIVE   Leukocytes,Ua MODERATE (A) NEGATIVE   RBC / HPF 0-5 0 - 5 RBC/hpf   WBC, UA 11-20 0 - 5 WBC/hpf   Bacteria, UA NONE SEEN NONE SEEN   Squamous Epithelial / LPF 11-20 0 - 5   Mucus PRESENT   Hemoglobin and hematocrit, blood     Status: Abnormal   Collection Time: 08/21/19  4:25 PM  Result Value Ref Range   Hemoglobin 10.1 (L) 12.0 - 15.0 g/dL   HCT 40.931.8 (L) 81.136.0 - 91.446.0 %    Imaging:  No results found.  MAU Course: Orders Placed This Encounter  Procedures  . Urinalysis, Routine w reflex microscopic  . Hemoglobin and hematocrit, blood  . Orthostatic vital signs  . ED EKG  . Discharge patient   Meds ordered this encounter  Medications  . ferrous sulfate (FERROUSUL) 325 (65 FE) MG tablet    Sig: Take 1 tablet (325 mg total) by mouth 2 (two) times daily.    Dispense:  60 tablet    Refill:  1    Order Specific Question:   Supervising Provider    Answer:   Reva BoresPRATT, TANYA S [[id number]]    MDM: Reactive NST VSS. Not orthostatic. SpO2 99-100% EKG normal, reviewed by Dr. Delton SeeNelson with cardiology H/H done, hgb 10.1, was 10.4 at her 28 wk lab appt so not much change. Not taking anything for anemia. Will start on iron supplements.  U/a negative for signs of dehydration.  Pt stable for discharge. Discussed need for ED evaluation if symptoms worsen or if cough/fever/chest pain develops.   Assessment: 1. Intermittent palpitations   2. [redacted] weeks gestation of pregnancy   3. Anemia complicating pregnancy in third trimester     Plan: Discharge home in stable condition.  Discussed reasons to return to MAU vs ED  Follow-up Information    [facility].   Why: keep scheduled appointment or call office as needed Contact information: [address] [place] [id number] [phone]        [facility] Follow up.   Specialty: Obstetrics and Gynecology Why: return for worsening symptoms Contact information: [address] [id number] [place] [id number] [phone]          Allergies as of 08/21/2019   No Known Allergies     Medication List  TAKE these medications   ferrous sulfate 325 (65 FE) MG tablet Commonly known as: FerrouSul Take 1 tablet (325 mg total) by mouth 2 (two) times daily.   multivitamin-prenatal 27-0.8 MG Tabs tablet Take 1 tablet by mouth daily at 12 noon.       Judeth Horn, NP 08/21/2019 6:39 PM

## 2019-08-21 NOTE — MAU Note (Addendum)
Heart is beating really hard, feels SOB, and weak.  Called nurse line, told to come in.  States started this morning.  Never had this before, has been going on all day. When she stands, even if not for a long time, she starts feeling weak, more intense when standing. Denies pain, no leaking or bleeding, +FM. No OB complaint. No vomited, had diarrhea(loose) a few days ago, last was on Wed.

## 2019-08-31 ENCOUNTER — Ambulatory Visit (HOSPITAL_COMMUNITY)
Admission: RE | Admit: 2019-08-31 | Discharge: 2019-08-31 | Disposition: A | Discharge status: Home / Self Care | Payer: Self-pay | Source: Ambulatory Visit | Attending: Obstetrics and Gynecology | Admitting: Obstetrics and Gynecology

## 2019-08-31 ENCOUNTER — Other Ambulatory Visit: Payer: Self-pay

## 2019-08-31 DIAGNOSIS — Z3A36 36 weeks gestation of pregnancy: Secondary | ICD-10-CM

## 2019-08-31 DIAGNOSIS — O283 Abnormal ultrasonic finding on antenatal screening of mother: Secondary | ICD-10-CM

## 2019-08-31 DIAGNOSIS — Z362 Encounter for other antenatal screening follow-up: Secondary | ICD-10-CM

## 2019-08-31 DIAGNOSIS — R772 Abnormality of alphafetoprotein: Secondary | ICD-10-CM | POA: Insufficient documentation

## 2019-09-07 LAB — OB RESULTS CONSOLE GBS: GBS: NEGATIVE

## 2019-09-07 LAB — OB RESULTS CONSOLE GC/CHLAMYDIA
Chlamydia: NEGATIVE
Gonorrhea: NEGATIVE

## 2019-09-29 ENCOUNTER — Telehealth (HOSPITAL_COMMUNITY): Payer: Self-pay | Admitting: *Deleted

## 2019-09-29 NOTE — Telephone Encounter (Signed)
[phone] interpreter number  Preadmission screen

## 2019-09-30 ENCOUNTER — Telehealth (HOSPITAL_COMMUNITY): Payer: Self-pay | Admitting: *Deleted

## 2019-09-30 NOTE — Telephone Encounter (Signed)
Preadmission screen Interpreter number [phone]

## 2019-10-01 ENCOUNTER — Other Ambulatory Visit: Payer: Self-pay

## 2019-10-01 ENCOUNTER — Inpatient Hospital Stay (HOSPITAL_COMMUNITY)
Admission: AD | Admit: 2019-10-01 | Discharge: 2019-10-03 | DRG: 806 | Disposition: A | Discharge status: Home / Self Care | Payer: Medicaid Other | Attending: Obstetrics and Gynecology | Admitting: Obstetrics and Gynecology

## 2019-10-01 ENCOUNTER — Inpatient Hospital Stay (HOSPITAL_COMMUNITY): Payer: Medicaid Other | Admitting: Anesthesiology

## 2019-10-01 ENCOUNTER — Encounter (HOSPITAL_COMMUNITY): Payer: Self-pay | Admitting: *Deleted

## 2019-10-01 DIAGNOSIS — D6959 Other secondary thrombocytopenia: Secondary | ICD-10-CM | POA: Diagnosis present

## 2019-10-01 DIAGNOSIS — O368130 Decreased fetal movements, third trimester, not applicable or unspecified: Principal | ICD-10-CM | POA: Diagnosis present

## 2019-10-01 DIAGNOSIS — O480 Post-term pregnancy: Secondary | ICD-10-CM

## 2019-10-01 DIAGNOSIS — O9912 Other diseases of the blood and blood-forming organs and certain disorders involving the immune mechanism complicating childbirth: Secondary | ICD-10-CM | POA: Diagnosis present

## 2019-10-01 DIAGNOSIS — Z3490 Encounter for supervision of normal pregnancy, unspecified, unspecified trimester: Secondary | ICD-10-CM | POA: Diagnosis present

## 2019-10-01 DIAGNOSIS — Z23 Encounter for immunization: Secondary | ICD-10-CM | POA: Diagnosis not present

## 2019-10-01 DIAGNOSIS — Z3A40 40 weeks gestation of pregnancy: Secondary | ICD-10-CM

## 2019-10-01 DIAGNOSIS — Z20828 Contact with and (suspected) exposure to other viral communicable diseases: Secondary | ICD-10-CM | POA: Diagnosis present

## 2019-10-01 LAB — SARS CORONAVIRUS 2 BY RT PCR (HOSPITAL ORDER, PERFORMED IN ~~LOC~~ HOSPITAL LAB): SARS Coronavirus 2: NEGATIVE

## 2019-10-01 LAB — CBC
HCT: 31 % — ABNORMAL LOW (ref 36.0–46.0)
Hemoglobin: 9.6 g/dL — ABNORMAL LOW (ref 12.0–15.0)
MCH: 23.8 pg — ABNORMAL LOW (ref 26.0–34.0)
MCHC: 31 g/dL (ref 30.0–36.0)
MCV: 76.7 fL — ABNORMAL LOW (ref 80.0–100.0)
Platelets: 124 10*3/uL — ABNORMAL LOW (ref 150–400)
RBC: 4.04 MIL/uL (ref 3.87–5.11)
RDW: 15.6 % — ABNORMAL HIGH (ref 11.5–15.5)
WBC: 5.6 10*3/uL (ref 4.0–10.5)
nRBC: 0 % (ref 0.0–0.2)

## 2019-10-01 LAB — TYPE AND SCREEN
ABO/RH(D): O POS
Antibody Screen: NEGATIVE

## 2019-10-01 LAB — ABO/RH: ABO/RH(D): O POS

## 2019-10-01 MED ORDER — EPHEDRINE 5 MG/ML INJ: 10.0000 mg | INTRAVENOUS | Status: DC | PRN

## 2019-10-01 MED ORDER — OXYCODONE-ACETAMINOPHEN 5-325 MG PO TABS: 2.0000 | ORAL_TABLET | ORAL | Status: DC | PRN

## 2019-10-01 MED ORDER — LIDOCAINE HCL (PF) 1 % IJ SOLN
INTRAMUSCULAR | Status: DC | PRN
  Administered 2019-10-01: 7 mL via EPIDURAL
  Administered 2019-10-01: 5 mL via EPIDURAL

## 2019-10-01 MED ORDER — PHENYLEPHRINE 40 MCG/ML (10ML) SYRINGE FOR IV PUSH (FOR BLOOD PRESSURE SUPPORT): 80.0000 ug | PREFILLED_SYRINGE | INTRAVENOUS | Status: DC | PRN

## 2019-10-01 MED ORDER — OXYTOCIN 40 UNITS IN NORMAL SALINE INFUSION - SIMPLE MED: 2.5000 [IU]/h | INTRAVENOUS | Status: DC

## 2019-10-01 MED ORDER — ACETAMINOPHEN 325 MG PO TABS
650.0000 mg | ORAL_TABLET | ORAL | Status: DC | PRN
  Administered 2019-10-01: 650 mg via ORAL
  Filled 2019-10-01: qty 2

## 2019-10-01 MED ORDER — OXYTOCIN BOLUS FROM INFUSION
500.0000 mL | Freq: Once | INTRAVENOUS | Status: AC
  Administered 2019-10-02: 500 mL via INTRAVENOUS

## 2019-10-01 MED ORDER — SODIUM CHLORIDE (PF) 0.9 % IJ SOLN
INTRAMUSCULAR | Status: DC | PRN
  Administered 2019-10-01: 12 mL/h via EPIDURAL

## 2019-10-01 MED ORDER — LACTATED RINGERS IV SOLN
INTRAVENOUS | Status: DC
  Administered 2019-10-01 (×2): via INTRAVENOUS

## 2019-10-01 MED ORDER — SOD CITRATE-CITRIC ACID 500-334 MG/5ML PO SOLN: 30.0000 mL | ORAL | Status: DC | PRN

## 2019-10-01 MED ORDER — LACTATED RINGERS IV SOLN: 500.0000 mL | Freq: Once | INTRAVENOUS | Status: DC

## 2019-10-01 MED ORDER — MISOPROSTOL 50MCG HALF TABLET: 50.0000 ug | ORAL_TABLET | ORAL | Status: DC | PRN

## 2019-10-01 MED ORDER — OXYCODONE-ACETAMINOPHEN 5-325 MG PO TABS: 1.0000 | ORAL_TABLET | ORAL | Status: DC | PRN

## 2019-10-01 MED ORDER — OXYTOCIN 40 UNITS IN NORMAL SALINE INFUSION - SIMPLE MED
1.0000 m[IU]/min | INTRAVENOUS | Status: DC
  Administered 2019-10-01: 19:00:00 2 m[IU]/min via INTRAVENOUS
  Filled 2019-10-01: qty 1000

## 2019-10-01 MED ORDER — FENTANYL-BUPIVACAINE-NACL 0.5-0.125-0.9 MG/250ML-% EP SOLN
12.0000 mL/h | EPIDURAL | Status: DC | PRN
  Filled 2019-10-01: qty 250

## 2019-10-01 MED ORDER — PHENYLEPHRINE 40 MCG/ML (10ML) SYRINGE FOR IV PUSH (FOR BLOOD PRESSURE SUPPORT)
80.0000 ug | PREFILLED_SYRINGE | INTRAVENOUS | Status: DC | PRN
  Filled 2019-10-01: qty 10

## 2019-10-01 MED ORDER — LACTATED RINGERS IV SOLN: 500.0000 mL | INTRAVENOUS | Status: DC | PRN

## 2019-10-01 MED ORDER — LIDOCAINE HCL (PF) 1 % IJ SOLN: 30.0000 mL | INTRAMUSCULAR | Status: DC | PRN

## 2019-10-01 MED ORDER — DIPHENHYDRAMINE HCL 50 MG/ML IJ SOLN: 12.5000 mg | INTRAMUSCULAR | Status: DC | PRN

## 2019-10-01 MED ORDER — TERBUTALINE SULFATE 1 MG/ML IJ SOLN: 0.2500 mg | Freq: Once | INTRAMUSCULAR | Status: DC | PRN

## 2019-10-01 MED ORDER — ONDANSETRON HCL 4 MG/2ML IJ SOLN: 4.0000 mg | Freq: Four times a day (QID) | INTRAMUSCULAR | Status: DC | PRN

## 2019-10-01 NOTE — Anesthesia Preprocedure Evaluation (Addendum)
Anesthesia Evaluation  Patient identified by MRN, date of birth, ID band Patient awake    Reviewed: Allergy & Precautions, H&P , NPO status , Patient's Chart, lab work & pertinent test results  Airway Mallampati: I  TM Distance: >3 FB Neck ROM: full    Dental no notable dental hx. (+) Teeth Intact   Pulmonary neg pulmonary ROS,    Pulmonary exam normal breath sounds clear to auscultation       Cardiovascular negative cardio ROS   Rhythm:regular Rate:Normal     Neuro/Psych negative neurological ROS  negative psych ROS   GI/Hepatic negative GI ROS, Neg liver ROS,   Endo/Other  negative endocrine ROS  Renal/GU negative Renal ROS  negative genitourinary   Musculoskeletal negative musculoskeletal ROS (+)   Abdominal (+) + obese,   Peds  Hematology negative hematology ROS (+)   Anesthesia Other Findings   Reproductive/Obstetrics (+) Pregnancy                             Anesthesia Physical Anesthesia Plan  ASA: II  Anesthesia Plan: Epidural   Post-op Pain Management:    Induction:   PONV Risk Score and Plan:   Airway Management Planned:   Additional Equipment:   Intra-op Plan:   Post-operative Plan:   Informed Consent: I have reviewed the patients History and Physical, chart, labs and discussed the procedure including the risks, benefits and alternatives for the proposed anesthesia with the patient or authorized representative who has indicated his/her understanding and acceptance.       Plan Discussed with:   Anesthesia Plan Comments:         Anesthesia Quick Evaluation

## 2019-10-01 NOTE — Anesthesia Procedure Notes (Signed)
Epidural Patient location during procedure: OB Start time: 10/01/2019 9:07 PM End time: 10/01/2019 9:10 PM  Staffing Anesthesiologist: Lyn Hollingshead, MD  Preanesthetic Checklist Completed: patient identified, site marked, surgical consent, pre-op evaluation, timeout performed, IV checked, risks and benefits discussed and monitors and equipment checked  Epidural Patient position: sitting Prep: site prepped and draped and DuraPrep Patient monitoring: continuous pulse ox and blood pressure Approach: midline Location: L3-L4 Injection technique: LOR air  Needle:  Needle type: Tuohy  Needle gauge: 17 G Needle length: 9 cm and 9 Needle insertion depth: 5 cm cm Catheter type: closed end flexible Catheter size: 19 Gauge Catheter at skin depth: 10 cm Test dose: negative and Other  Assessment Events: blood not aspirated, injection not painful, no injection resistance, negative IV test and no paresthesia  Additional Notes Reason for block:procedure for pain

## 2019-10-01 NOTE — MAU Note (Signed)
Presents with c/o ctxs since last night and VB with wiping only.  Reports VB wasn't bright red.  Also reports decreased FM, states no FM today @ all and has eaten breakfast.

## 2019-10-01 NOTE — Progress Notes (Signed)
LABOR PROGRESS NOTE  Niani Mourer is a 33 y.o. G3P2002 at [redacted]w[redacted]d  admitted for IOL due to decreased fetal movement.  Subjective: Doing well.  Reports she has not felt the Foley bulb for out.  Is feeling regular contractions.  Is otherwise doing well.  Objective: BP 113/68   Pulse 76   Temp 98.8 F (37.1 C) (Oral)   Resp 18   Ht 5\' 2"  (1.575 m)   Wt 81.6 kg   LMP 12/31/2018 (Exact Date)   SpO2 99%   BMI 32.92 kg/m  or  Vitals:   10/01/19 1114 10/01/19 1512 10/01/19 1628 10/01/19 1859  BP: 103/73 110/73 114/76 113/68  Pulse: (!) 116 78 69 76  Resp: 20 18 18 18   Temp: 98.1 F (36.7 C) 98.8 F (37.1 C)    TempSrc:  Oral    SpO2: 99%     Weight: 81.6 kg 81.6 kg    Height: 5\' 2"  (1.575 m) 5\' 2"  (1.575 m)     Dilation: 5.5 Effacement (%): 70, 80 Cervical Position: Posterior Station: -3 Presentation: Vertex Exam by:: Dr. Caron Presume FHT: baseline rate 130, moderate varibility, + acel, no decel Toco: hard to trace   Labs: Lab Results  Component Value Date   WBC 5.6 10/01/2019   HGB 9.6 (L) 10/01/2019   HCT 31.0 (L) 10/01/2019   MCV 76.7 (L) 10/01/2019   PLT 124 (L) 10/01/2019    Patient Active Problem List   Diagnosis Date Noted  . Encounter for induction of labor 10/01/2019  . COVID-19 06/02/2019  . Cholecystitis 11/12/2017  . Appendicitis, acute, with peritonitis 09/25/2015    Assessment / Plan: 33 y.o. G3P2002 at [redacted]w[redacted]d here for IOL for decreased fetal movement.  Labor: Nurse gave follow-up with her as well in the room and Foley bulb fell out quickly.  Cervical exam showed between 5 and 6 cm dilation.  Starting Pitocin 2 x 2. Fetal Wellbeing: Category 1 reassuring Pain Control: IV pain medicine. Anticipated MOD: Vaginal  Gifford Shave, MD 10/01/2019, 7:38 PM

## 2019-10-01 NOTE — H&P (Addendum)
LABOR AND DELIVERY ADMISSION HISTORY AND PHYSICAL NOTE  Amy Lloyd is a 33 y.o. female G3P2002 with IUP at [redacted]w[redacted]d by L+ 8 wk [place] presenting for IOL due to decreased fetal movement.  Patient reports she first noticed a decreased fetal movement last night.  She also had a little blood in her vaginal discharge last night.  She denies any more vaginal bleeding today.    Patient says that she has had no complications this pregnancy.  Patient says that previous deliveries had no complications and her last 1 lasted from 7 AM to 1 PM.  Patient denies any recent illnesses.  Denies shortness of breath, chest pain, nausea, vomiting.  Acknowledges a headache earlier that was treated with Tylenol and resolved.  Patient reports no leakage of fluids and says that baby is moving well now.  Prenatal History/Complications:  Past Medical History: Past Medical History:  Diagnosis Date  . Gallbladder attack     Past Surgical History: Past Surgical History:  Procedure Laterality Date  . APPENDECTOMY  09/26/2015   "lap"  . CHOLECYSTECTOMY N/A 11/12/2017   Procedure: LAPAROSCOPIC CHOLECYSTECTOMY;  Surgeon: Rolm Bookbinder, MD;  Location: [facility];  Service: General;  Laterality: N/A;  . LAPAROSCOPIC APPENDECTOMY N/A 09/26/2015   Procedure: APPENDECTOMY LAPAROSCOPIC;  Surgeon: Rolm Bookbinder, MD;  Location: [facility];  Service: General;  Laterality: N/A;    Obstetrical History: OB History    Gravida  3   Para  2   Term  2   Preterm      AB      Living  2     SAB      TAB      Ectopic      Multiple      Live Births  2           Social History: Social History   Socioeconomic History  . Marital status: Significant Other    Spouse name: Not on file  . Number of children: Not on file  . Years of education: Not on file  . Highest education level: Not on file  Occupational History  . Not on file  Social Needs  . Financial resource strain: Not on file  . Food  insecurity    Worry: Not on file    Inability: Not on file  . Transportation needs    Medical: Not on file    Non-medical: Not on file  Tobacco Use  . Smoking status: Never Smoker  . Smokeless tobacco: Never Used  Substance and Sexual Activity  . Alcohol use: No  . Drug use: No  . Sexual activity: Yes    Birth control/protection: None    Comment: 10 July 20  Lifestyle  . Physical activity    Days per week: Not on file    Minutes per session: Not on file  . Stress: Not on file  Relationships  . Social Herbalist on phone: Not on file    Gets together: Not on file    Attends religious service: Not on file    Active member of club or organization: Not on file    Attends meetings of clubs or organizations: Not on file    Relationship status: Not on file  Other Topics Concern  . Not on file  Social History Narrative  . Not on file    Family History: History reviewed. No pertinent family history.  Allergies: No Known Allergies  Medications Prior to Admission  Medication Sig  Dispense Refill Last Dose  . ferrous sulfate (FERROUSUL) 325 (65 FE) MG tablet Take 1 tablet (325 mg total) by mouth 2 (two) times daily. 60 tablet 1 09/30/2019 at 1500  . Prenatal Vit-Fe Fumarate-FA (MULTIVITAMIN-PRENATAL) 27-0.8 MG TABS tablet Take 1 tablet by mouth daily at 12 noon.   10/01/2019 at 0800     Review of Systems   All systems reviewed and negative except as stated in HPI  Blood pressure 110/73, pulse 78, temperature 98.8 F (37.1 C), temperature source Oral, resp. rate 18, height 5\' 2"  (1.575 m), weight 81.6 kg, last menstrual period 12/31/2018, SpO2 99 %. General appearance: alert, cooperative and no distress Lungs: clear to auscultation bilaterally Heart: regular rate and rhythm Abdomen: soft, non-tender; bowel sounds normal Extremities: No calf swelling or tenderness Presentation: cephalic Fetal monitoring: 150 bpm, no accelerations or decelerations Uterine  activity: Irregular contractions Dilation: 1 Effacement (%): 50 Station: -3 Exam by:: K.Wilson<RN   Prenatal labs: ABO, Rh: --/--/O POS (10/15 1408) Antibody: NEG (10/15 1408) Rubella: Immune (04/06 0000) RPR: Nonreactive (04/06 0000)  HBsAg: Negative (04/06 0000)  HIV: Non-reactive (04/06 0000)  GBS: Negative/-- (09/21 0000)  1 hr Glucola:Neg  Genetic screening: Borderline elevated AFP-2.34 Anatomy US: Normal  Prenatal Transfer Tool  Maternal Diabetes: No Genetic Screening: Abnormal:  Results: Elevated AFP Maternal Ultrasounds/Referrals: Normal Fetal Ultrasounds or other Referrals:  Fetal echo Maternal Substance Abuse:  No Significant Maternal Medications:  None Significant Maternal Lab Results: Group B Strep negative  Results for orders placed or performed during the hospital encounter of 10/01/19 (from the past 24 hour(s))  CBC   Collection Time: 10/01/19  2:08 PM  Result Value Ref Range   WBC 5.6 4.0 - 10.5 K/uL   RBC 4.04 3.87 - 5.11 MIL/uL   Hemoglobin 9.6 (L) 12.0 - 15.0 g/dL   HCT 16.131.0 (L) 09.636.0 - 04.546.0 %   MCV 76.7 (L) 80.0 - 100.0 fL   MCH 23.8 (L) 26.0 - 34.0 pg   MCHC 31.0 30.0 - 36.0 g/dL   RDW 40.915.6 (H) 81.111.5 - 91.415.5 %   Platelets 124 (L) 150 - 400 K/uL   nRBC 0.0 0.0 - 0.2 %  Type and screen [facility]   Collection Time: 10/01/19  2:08 PM  Result Value Ref Range   ABO/RH(D) O POS    Antibody Screen NEG    Sample Expiration      10/04/2019,2359 Performed at [facility], [address] [name], [place] [address]     Patient Active Problem List   Diagnosis Date Noted  . Encounter for induction of labor 10/01/2019  . COVID-19 06/02/2019  . Cholecystitis 11/12/2017  . Appendicitis, acute, with peritonitis 09/25/2015    Assessment: Elmo PuttSandy Lloyd is a 33 y.o. G3P2002 at 4423w3d here for induction of labor due to decreased fetal movement.  #Labor: Progressing well.  Foley balloon placed on initial exam.  Will initiate  2 x 2 Pitocin #Pain: Patient had epidurals with 2 previous deliveries.  Wishes to not have epidural with this delivery but says that she will use IV pain medicine #FWB: Category 1 #ID:  GBS negative #MOF: Breast and bottle #MOC: Unsure at this time but considering either IUD or POP  Derrel NipVictor Cresenzo 10/01/2019, 3:49 PM  OB FELLOW ATTESTATION  I have seen and examined this patient and agree with above documentation in the resident's note except as noted below.  33 y/o G3P2 at 1023w3d here for IOL for decreased fetal movement.  GBS negative. Admission labs notable for platelets of 124, previously normal, likely gestational thrombocytopenia. Two prior NSVD without complications. Induce w FB, pit.   Zack Seal, MD/[facility] OB Fellow  10/01/2019, 6:17 PM

## 2019-10-02 ENCOUNTER — Encounter (HOSPITAL_COMMUNITY): Payer: Self-pay

## 2019-10-02 DIAGNOSIS — Z3A40 40 weeks gestation of pregnancy: Secondary | ICD-10-CM

## 2019-10-02 LAB — RPR: RPR Ser Ql: NONREACTIVE

## 2019-10-02 MED ORDER — DIPHENHYDRAMINE HCL 25 MG PO CAPS: 25.0000 mg | ORAL_CAPSULE | Freq: Four times a day (QID) | ORAL | Status: DC | PRN

## 2019-10-02 MED ORDER — MEASLES, MUMPS & RUBELLA VAC IJ SOLR: 0.5000 mL | Freq: Once | INTRAMUSCULAR | Status: DC

## 2019-10-02 MED ORDER — ONDANSETRON HCL 4 MG PO TABS: 4.0000 mg | ORAL_TABLET | ORAL | Status: DC | PRN

## 2019-10-02 MED ORDER — DIBUCAINE (PERIANAL) 1 % EX OINT: 1.0000 "application " | TOPICAL_OINTMENT | CUTANEOUS | Status: DC | PRN

## 2019-10-02 MED ORDER — ONDANSETRON HCL 4 MG/2ML IJ SOLN: 4.0000 mg | INTRAMUSCULAR | Status: DC | PRN

## 2019-10-02 MED ORDER — ACETAMINOPHEN 325 MG PO TABS: 650.0000 mg | ORAL_TABLET | Freq: Four times a day (QID) | ORAL | Status: DC | PRN

## 2019-10-02 MED ORDER — WITCH HAZEL-GLYCERIN EX PADS: 1.0000 "application " | MEDICATED_PAD | CUTANEOUS | Status: DC | PRN

## 2019-10-02 MED ORDER — PRENATAL MULTIVITAMIN CH
1.0000 | ORAL_TABLET | Freq: Every day | ORAL | Status: DC
  Administered 2019-10-02 – 2019-10-03 (×2): 1 via ORAL
  Filled 2019-10-02 (×3): qty 1

## 2019-10-02 MED ORDER — COCONUT OIL OIL: 1.0000 "application " | TOPICAL_OIL | Status: DC | PRN

## 2019-10-02 MED ORDER — SIMETHICONE 80 MG PO CHEW: 80.0000 mg | CHEWABLE_TABLET | ORAL | Status: DC | PRN

## 2019-10-02 MED ORDER — TETANUS-DIPHTH-ACELL PERTUSSIS 5-2.5-18.5 LF-MCG/0.5 IM SUSP: 0.5000 mL | Freq: Once | INTRAMUSCULAR | Status: DC

## 2019-10-02 MED ORDER — IBUPROFEN 600 MG PO TABS
600.0000 mg | ORAL_TABLET | Freq: Three times a day (TID) | ORAL | Status: DC | PRN
  Administered 2019-10-02 (×2): 600 mg via ORAL
  Filled 2019-10-02 (×3): qty 1

## 2019-10-02 MED ORDER — BENZOCAINE-MENTHOL 20-0.5 % EX AERO: 1.0000 "application " | INHALATION_SPRAY | CUTANEOUS | Status: DC | PRN

## 2019-10-02 MED ORDER — INFLUENZA VAC SPLIT QUAD 0.5 ML IM SUSY
0.5000 mL | PREFILLED_SYRINGE | INTRAMUSCULAR | Status: AC
  Administered 2019-10-03: 14:00:00 0.5 mL via INTRAMUSCULAR
  Filled 2019-10-02: qty 0.5

## 2019-10-02 MED ORDER — SENNOSIDES-DOCUSATE SODIUM 8.6-50 MG PO TABS
2.0000 | ORAL_TABLET | ORAL | Status: DC
  Administered 2019-10-02: 2 via ORAL
  Filled 2019-10-02: qty 2

## 2019-10-02 NOTE — Discharge Summary (Signed)
Postpartum Discharge Summary     Patient Name: Amy Lloyd DOB: [birth year] MRN: [id number]  Date of admission: 10/01/2019 Delivering Provider: Chauncey Mann   Date of discharge: 10/03/2019  Admitting diagnosis: Bleeding CTX Intrauterine pregnancy: [redacted]w[redacted]d    Secondary diagnosis:  Active Problems:   Encounter for induction of labor   [redacted] weeks gestation of pregnancy  Additional problems: None     Discharge diagnosis: Term Pregnancy Delivered                                                                                                Post partum procedures:None  Augmentation: Pitocin and Foley Balloon  Complications: None  Hospital course:  Induction of Labor With Vaginal Delivery   33y.o. yo G3P2002 at 422w4das admitted to the hospital 10/01/2019 for induction of labor.  Indication for induction: Decreased fetal movement.  Patient had an uncomplicated labor course as follows. Patient presented to L&D for IOL for DFM. Initial SVE: 1/50/-3. Patient received Foley bulb and Pitocin. She had SROM. Received Epidural. She then progressed to complete.  Membrane Rupture Time/Date: 8:51 PM ,10/01/2019   Intrapartum Procedures: Episiotomy: None [1]                                         Lacerations:  1st degree [2]  Patient had delivery of a Viable infant.  Information for the patient's newborn:  [id number]]Delivery Method: Vaginal, Spontaneous(Filed from Delivery Summary)    10/02/2019  Details of delivery can be found in separate delivery note.  Patient had a routine postpartum course. Micronor prescribed on discharge. Patient to f/u at HD. Patient is discharged home 10/03/19. Delivery time: 1:29 AM    Magnesium Sulfate received: No BMZ received: No Rhophylac:No MMR:No Transfusion:No  Physical exam  Vitals:   10/02/19 0900 10/02/19 1300 10/02/19 1700 10/02/19 2016  BP: 113/78 107/66 108/82 107/63  Pulse: 69 74 65 66  Resp:  17 16 18 18   Temp: 98.8 F (37.1 C) 98.7 F (37.1 C) 98.7 F (37.1 C) 98.5 F (36.9 C)  TempSrc: Oral Oral Oral Oral  SpO2: 100% 100% 100% 100%  Weight:      Height:       General: alert, cooperative and no distress Lochia: appropriate Uterine Fundus: firm DVT Evaluation: No evidence of DVT seen on physical exam. Labs: Lab Results  Component Value Date   WBC 5.6 10/01/2019   HGB 9.6 (L) 10/01/2019   HCT 31.0 (L) 10/01/2019   MCV 76.7 (L) 10/01/2019   PLT 124 (L) 10/01/2019   CMP Latest Ref Rng & Units 07/04/2018  Glucose 65 - 99 mg/dL 78  BUN 6 - 20 mg/dL 13  Creatinine 0.57 - 1.00 mg/dL 0.74  Sodium 134 - 144 mmol/L 140  Potassium 3.5 - 5.2 mmol/L 4.0  Chloride 96 - 106 mmol/L 104  CO2 20 - 29 mmol/L 22  Calcium 8.7 - 10.2 mg/dL 9.2  Total Protein 6.0 - 8.5  g/dL 7.3  Total Bilirubin 0.0 - 1.2 mg/dL 0.5  Alkaline Phos 39 - 117 IU/L 105  AST 0 - 40 IU/L 19  ALT 0 - 32 IU/L 25    Discharge instruction: per After Visit Summary and "Baby and Me Booklet".  After visit meds:  Allergies as of 10/03/2019   No Known Allergies     Medication List    TAKE these medications   acetaminophen 325 MG tablet Commonly known as: Tylenol Take 2 tablets (650 mg total) by mouth every 6 (six) hours as needed (for pain scale < 4).   ferrous sulfate 325 (65 FE) MG tablet Commonly known as: FerrouSul Take 1 tablet (325 mg total) by mouth 2 (two) times daily.   ibuprofen 600 MG tablet Commonly known as: ADVIL Take 1 tablet (600 mg total) by mouth every 8 (eight) hours as needed for mild pain.   multivitamin-prenatal 27-0.8 MG Tabs tablet Take 1 tablet by mouth daily at 12 noon.   norethindrone 0.35 MG tablet Commonly known as: MICRONOR Take 1 tablet (0.35 mg total) by mouth daily.       Diet: routine diet  Activity: Advance as tolerated. Pelvic rest for 6 weeks.   Outpatient follow up:4 weeks Follow up Appt:No future appointments. Follow up Visit:  Patient to  schedule own f/u appt as HD patient.  Debating POP or IUD for contraception.  Newborn Data: Live born female  Birth Weight: 3860g  APGAR: 54, 10  Newborn Delivery   Birth date/time: 10/02/2019 01:29:00 Delivery type: Vaginal, Spontaneous      Baby Feeding: Bottle and Breast Disposition:home with mother   10/03/2019 Chauncey Mann, MD

## 2019-10-02 NOTE — Lactation Note (Signed)
This note was copied from a baby's chart. Lactation Consultation Note  Patient Name: Amy Lloyd [id number] Date: 10/02/2019 Reason for consult: Initial assessment;Term;1st time breastfeeding  P3 mother whose infant is now 47 hours old.  Mother did not breast feed her other children.  Her feeding preference on admission was breast/bottle.  Spanish interpreter [phone]) used for interpretation.  Baby was asleep in mother's arms when I arrived.  Mother was questioning her milk supply.  Basic breast feeding education completed regarding STS, hand expression, milk coming to volume and how to obtain a good milk supply.  Colostrum container provided and milk storage times reviewed.  Finger feeding demonstrated.  Mother was familiar with feeding cues and did not wish to review.  Encouraged to feed 8-12 times/24 hours or sooner if baby shows feeding cues.  Mother does not have a DEBP for home use but wishes to obtain a pump from [facility]  Referral faxed.  Encouraged mother to call today and follow up with obtaining a pump since it is Friday and they are closed on the weekend.  Mother verbalized understanding.    Mom made aware of O/P services, breastfeeding support groups, community resources, and our phone # for post-discharge questions.  Spanish brochures given.  Father present.  Suggested mother call her RN/LC for latch assistance.  Reminded her that most babies are very sleepy the first 24 hours after birth.      Maternal Data Formula Feeding for Exclusion: Yes Reason for exclusion: Mother's choice to formula and breast feed on admission Has patient been taught Hand Expression?: Yes Does the patient have breastfeeding experience prior to this delivery?: No  Feeding    LATCH Score                   Interventions    Lactation Tools Discussed/Used WIC Program: Yes   Consult Status Consult Status: Follow-up Date: 10/03/19 Follow-up type: In-patient    Little Ishikawa 10/02/2019, 12:35 PM

## 2019-10-02 NOTE — Anesthesia Postprocedure Evaluation (Signed)
Anesthesia Post Note  Patient: Amy Lloyd  Procedure(s) Performed: AN AD HOC LABOR EPIDURAL     Patient location during evaluation: Mother Baby Anesthesia Type: Epidural Level of consciousness: awake and alert, oriented and patient cooperative Pain management: pain level controlled Vital Signs Assessment: post-procedure vital signs reviewed and stable Respiratory status: spontaneous breathing Cardiovascular status: stable Postop Assessment: no headache, epidural receding, patient able to bend at knees and no signs of nausea or vomiting Anesthetic complications: no Comments: Pt. States she is walking.  Pain score 3.      Last Vitals:  Vitals:   10/02/19 0900 10/02/19 1300  BP: 113/78 107/66  Pulse: 69 74  Resp: 17 16  Temp: 37.1 C 37.1 C  SpO2: 100% 100%    Last Pain:  Vitals:   10/02/19 1300  TempSrc: Oral  PainSc: 2    Pain Goal:                   [facility]

## 2019-10-03 MED ORDER — IBUPROFEN 600 MG PO TABS: 600.0000 mg | ORAL_TABLET | Freq: Three times a day (TID) | ORAL | 0 refills | Status: DC | PRN

## 2019-10-03 MED ORDER — NORETHINDRONE 0.35 MG PO TABS: 1.0000 | ORAL_TABLET | Freq: Every day | ORAL | 11 refills | Status: DC

## 2019-10-03 MED ORDER — ACETAMINOPHEN 325 MG PO TABS: 650.0000 mg | ORAL_TABLET | Freq: Four times a day (QID) | ORAL | 0 refills | Status: DC | PRN

## 2019-10-03 NOTE — Lactation Note (Signed)
This note was copied from a baby's chart. Lactation Consultation Note  Patient Name: Amy Lloyd [id number] Date: 10/03/2019 Reason for consult: Follow-up assessment;Infant weight loss;Term  75 hours old FT female who is being partially BF and formula fed by her mother, she's a P3 and experienced BF. Mom and baby are going home today, baby is at 5% weight loss. Per MD note, she tested (+) for COVID-19 in June but negative on admission.   Mom was already nursing baby when entering the room, she was doing the side-lying position baby having a deep latch, a few audible swallows noted, baby still nursing when exiting the room at the 22 minutes mark. Mom states feeding at the breast are comfortable, but she's still telling LC that she's doing Gerber Gentle because baby is not getting "enough". Explained lactogenesis II to parents and encouraged to feed on cues.  Reviewed discharge instructions, engorgement prevention/treatment, treatment for sore nipples and red flags on when to call baby's pediatrician. Parents reported all questions and concerns were answered, they're both aware of [facility] OP services and will call PRN.  Maternal Data    Feeding Feeding Type: Breast Fed  LATCH Score Latch: Grasps breast easily, tongue down, lips flanged, rhythmical sucking.  Audible Swallowing: A few with stimulation  Type of Nipple: Everted at rest and after stimulation  Comfort (Breast/Nipple): Soft / non-tender  Hold (Positioning): No assistance needed to correctly position infant at breast.  LATCH Score: 9  Interventions Interventions: Breast feeding basics reviewed  Lactation Tools Discussed/Used     Consult Status Consult Status: Complete Date: 10/03/19 Follow-up type: Call as needed    [place] 10/03/2019, 11:22 AM

## 2019-10-03 NOTE — Progress Notes (Signed)
Discharge instructions on mom and baby reviewed with patient using [employer]pre[employer] services # [phone]. Answered all questions and concerns.

## 2019-10-05 ENCOUNTER — Inpatient Hospital Stay (HOSPITAL_COMMUNITY): Payer: Self-pay

## 2019-10-05 ENCOUNTER — Inpatient Hospital Stay (HOSPITAL_COMMUNITY): Admission: AD | Admit: 2019-10-05 | Payer: Self-pay | Source: Home / Self Care | Admitting: Obstetrics and Gynecology

## 2020-01-20 ENCOUNTER — Emergency Department (HOSPITAL_COMMUNITY): Payer: Self-pay

## 2020-01-20 ENCOUNTER — Encounter (HOSPITAL_COMMUNITY): Payer: Self-pay | Admitting: Emergency Medicine

## 2020-01-20 ENCOUNTER — Emergency Department (HOSPITAL_COMMUNITY)
Admission: EM | Admit: 2020-01-20 | Discharge: 2020-01-20 | Disposition: A | Discharge status: Home / Self Care | Payer: Self-pay | Attending: Emergency Medicine | Admitting: Emergency Medicine

## 2020-01-20 ENCOUNTER — Other Ambulatory Visit: Payer: Self-pay

## 2020-01-20 DIAGNOSIS — K2900 Acute gastritis without bleeding: Secondary | ICD-10-CM | POA: Insufficient documentation

## 2020-01-20 DIAGNOSIS — R509 Fever, unspecified: Secondary | ICD-10-CM | POA: Insufficient documentation

## 2020-01-20 DIAGNOSIS — R1010 Upper abdominal pain, unspecified: Secondary | ICD-10-CM | POA: Insufficient documentation

## 2020-01-20 DIAGNOSIS — Z8616 Personal history of COVID-19: Secondary | ICD-10-CM | POA: Insufficient documentation

## 2020-01-20 DIAGNOSIS — R1110 Vomiting, unspecified: Secondary | ICD-10-CM | POA: Insufficient documentation

## 2020-01-20 LAB — URINALYSIS, ROUTINE W REFLEX MICROSCOPIC
Bilirubin Urine: NEGATIVE
Glucose, UA: NEGATIVE mg/dL
Ketones, ur: NEGATIVE mg/dL
Nitrite: NEGATIVE
Protein, ur: NEGATIVE mg/dL
Specific Gravity, Urine: 1.016 (ref 1.005–1.030)
pH: 6 (ref 5.0–8.0)

## 2020-01-20 LAB — COMPREHENSIVE METABOLIC PANEL
ALT: 29 U/L (ref 0–44)
AST: 29 U/L (ref 15–41)
Albumin: 3.5 g/dL (ref 3.5–5.0)
Alkaline Phosphatase: 102 U/L (ref 38–126)
Anion gap: 13 (ref 5–15)
BUN: 7 mg/dL (ref 6–20)
CO2: 24 mmol/L (ref 22–32)
Calcium: 8.8 mg/dL — ABNORMAL LOW (ref 8.9–10.3)
Chloride: 103 mmol/L (ref 98–111)
Creatinine, Ser: 0.61 mg/dL (ref 0.44–1.00)
GFR calc Af Amer: >60 mL/min (ref >60)
GFR calc non Af Amer: >60 mL/min (ref >60)
Glucose, Bld: 91 mg/dL (ref 70–99)
Potassium: 3.4 mmol/L — ABNORMAL LOW (ref 3.5–5.1)
Sodium: 140 mmol/L (ref 135–145)
Total Bilirubin: 1.2 mg/dL (ref 0.3–1.2)
Total Protein: 6.7 g/dL (ref 6.5–8.1)

## 2020-01-20 LAB — CBC
HCT: 40.6 % (ref 36.0–46.0)
Hemoglobin: 13.7 g/dL (ref 12.0–15.0)
MCH: 29.3 pg (ref 26.0–34.0)
MCHC: 33.7 g/dL (ref 30.0–36.0)
MCV: 86.9 fL (ref 80.0–100.0)
Platelets: 159 10*3/uL (ref 150–400)
RBC: 4.67 MIL/uL (ref 3.87–5.11)
RDW: 12.2 % (ref 11.5–15.5)
WBC: 3.8 10*3/uL — ABNORMAL LOW (ref 4.0–10.5)
nRBC: 0 % (ref 0.0–0.2)

## 2020-01-20 LAB — GROUP A STREP BY PCR: Group A Strep by PCR: NOT DETECTED

## 2020-01-20 LAB — LIPASE, BLOOD: Lipase: 22 U/L (ref 11–51)

## 2020-01-20 LAB — I-STAT BETA HCG BLOOD, ED (MC, WL, AP ONLY): I-stat hCG, quantitative: <5 m[IU]/mL (ref <5)

## 2020-01-20 MED ORDER — ONDANSETRON 4 MG PO TBDP: 4.0000 mg | ORAL_TABLET | Freq: Three times a day (TID) | ORAL | 0 refills | Status: DC | PRN

## 2020-01-20 MED ORDER — FAMOTIDINE IN NACL 20-0.9 MG/50ML-% IV SOLN
20.0000 mg | Freq: Once | INTRAVENOUS | Status: AC
  Administered 2020-01-20: 20 mg via INTRAVENOUS
  Filled 2020-01-20: qty 50

## 2020-01-20 MED ORDER — MORPHINE SULFATE (PF) 4 MG/ML IV SOLN
4.0000 mg | Freq: Once | INTRAVENOUS | Status: AC
  Administered 2020-01-20: 4 mg via INTRAVENOUS
  Filled 2020-01-20: qty 1

## 2020-01-20 MED ORDER — ONDANSETRON HCL 4 MG/2ML IJ SOLN
4.0000 mg | Freq: Once | INTRAMUSCULAR | Status: AC
  Administered 2020-01-20: 11:00:00 4 mg via INTRAVENOUS
  Filled 2020-01-20: qty 2

## 2020-01-20 MED ORDER — IOHEXOL 300 MG/ML  SOLN
100.0000 mL | Freq: Once | INTRAMUSCULAR | Status: AC | PRN
  Administered 2020-01-20: 14:00:00 100 mL via INTRAVENOUS

## 2020-01-20 MED ORDER — PANTOPRAZOLE SODIUM 40 MG PO TBEC: 40.0000 mg | DELAYED_RELEASE_TABLET | Freq: Every day | ORAL | 0 refills | Status: DC

## 2020-01-20 MED ORDER — METOCLOPRAMIDE HCL 5 MG/ML IJ SOLN
10.0000 mg | Freq: Once | INTRAMUSCULAR | Status: AC
  Administered 2020-01-20: 10:00:00 10 mg via INTRAVENOUS
  Filled 2020-01-20: qty 2

## 2020-01-20 MED ORDER — FAMOTIDINE 20 MG PO TABS: 20.0000 mg | ORAL_TABLET | Freq: Two times a day (BID) | ORAL | 0 refills | Status: DC

## 2020-01-20 MED ORDER — LIDOCAINE VISCOUS HCL 2 % MT SOLN
15.0000 mL | Freq: Once | OROMUCOSAL | Status: AC
  Administered 2020-01-20: 15 mL via ORAL
  Filled 2020-01-20: qty 15

## 2020-01-20 MED ORDER — MORPHINE SULFATE (PF) 4 MG/ML IV SOLN
4.0000 mg | Freq: Once | INTRAVENOUS | Status: AC
  Administered 2020-01-20: 11:00:00 4 mg via INTRAVENOUS
  Filled 2020-01-20: qty 1

## 2020-01-20 MED ORDER — SODIUM CHLORIDE 0.9 % IV BOLUS
1000.0000 mL | Freq: Once | INTRAVENOUS | Status: AC
  Administered 2020-01-20: 10:00:00 1000 mL via INTRAVENOUS

## 2020-01-20 MED ORDER — SODIUM CHLORIDE 0.9% FLUSH: 3.0000 mL | Freq: Once | INTRAVENOUS | Status: DC

## 2020-01-20 MED ORDER — ALUM & MAG HYDROXIDE-SIMETH 200-200-20 MG/5ML PO SUSP
30.0000 mL | Freq: Once | ORAL | Status: AC
  Administered 2020-01-20: 10:00:00 30 mL via ORAL
  Filled 2020-01-20: qty 30

## 2020-01-20 NOTE — ED Notes (Signed)
Transported to xray 

## 2020-01-20 NOTE — ED Provider Notes (Signed)
[facility] EMERGENCY DEPARTMENT Provider Note   CSN: [id number] Arrival date & time: 01/20/20  0626     History Chief Complaint  Patient presents with  . Abdominal Pain  . Back Pain    Amy Lloyd is a 34 y.o. female.  HPI 34 year old female presents with abdominal pain and headache. Spanish interpreter line used for translation. Started yesterday.  Abdominal pain is epigastric and also has some pain in her back.  2 episodes of vomiting.  Has had some burning abdominal pain.  Also has a frontal headache.  Noticed a little sore throat and states her temperature was up to 100.  No diarrhea or urinary symptoms.  She took some Tylenol.  No cough or shortness of breath.  No chest pain.  She is not currently breast-feeding.   Past Medical History:  Diagnosis Date  . Gallbladder attack     Patient Active Problem List   Diagnosis Date Noted  . [redacted] weeks gestation of pregnancy 10/02/2019  . Encounter for induction of labor 10/01/2019  . COVID-19 06/02/2019  . Cholecystitis 11/12/2017  . Appendicitis, acute, with peritonitis 09/25/2015    Past Surgical History:  Procedure Laterality Date  . APPENDECTOMY  09/26/2015   "lap"  . CHOLECYSTECTOMY N/A 11/12/2017   Procedure: LAPAROSCOPIC CHOLECYSTECTOMY;  Surgeon: Rolm Bookbinder, MD;  Location: [place];  Service: General;  Laterality: N/A;  . LAPAROSCOPIC APPENDECTOMY N/A 09/26/2015   Procedure: APPENDECTOMY LAPAROSCOPIC;  Surgeon: Rolm Bookbinder, MD;  Location: [place];  Service: General;  Laterality: N/A;     OB History    Gravida  3   Para  3   Term  3   Preterm      AB      Living  3     SAB      TAB      Ectopic      Multiple  0   Live Births  3           History reviewed. No pertinent family history.  Social History   Tobacco Use  . Smoking status: Never Smoker  . Smokeless tobacco: Never Used  Substance Use Topics  . Alcohol use: No  . Drug use: No    Home  Medications Prior to Admission medications   Medication Sig Start Date End Date Taking? Authorizing Provider  acetaminophen (TYLENOL) 325 MG tablet Take 2 tablets (650 mg total) by mouth every 6 (six) hours as needed (for pain scale < 4). 10/03/19  Yes Fair, Marin Shutter, MD  famotidine (PEPCID) 20 MG tablet Take 1 tablet (20 mg total) by mouth 2 (two) times daily. 01/20/20   Sherwood Gambler, MD  ferrous sulfate (FERROUSUL) 325 (65 FE) MG tablet Take 1 tablet (325 mg total) by mouth 2 (two) times daily. Patient not taking: Reported on 01/20/2020 08/21/19   Jorje Guild, NP  norethindrone (MICRONOR) 0.35 MG tablet Take 1 tablet (0.35 mg total) by mouth daily. Patient not taking: Reported on 01/20/2020 10/03/19   Chauncey Mann, MD  ondansetron (ZOFRAN ODT) 4 MG disintegrating tablet Take 1 tablet (4 mg total) by mouth every 8 (eight) hours as needed for nausea or vomiting. 01/20/20   Sherwood Gambler, MD  pantoprazole (PROTONIX) 40 MG tablet Take 1 tablet (40 mg total) by mouth daily. 01/20/20   Sherwood Gambler, MD    Allergies    Patient has no known allergies.  Review of Systems   Review of Systems  Constitutional: Positive for fever (  100).  HENT: Positive for sore throat.   Respiratory: Negative for cough and shortness of breath.   Cardiovascular: Negative for chest pain.  Gastrointestinal: Positive for abdominal pain and vomiting.  Genitourinary: Negative for dysuria.  Musculoskeletal: Positive for back pain.  Neurological: Positive for headaches.  All other systems reviewed and are negative.   Physical Exam Updated Vital Signs BP 117/78 (BP Location: Right Arm)   Pulse (!) 52   Temp 98.2 F (36.8 C) (Oral)   Resp 16   SpO2 99%   Physical Exam Vitals and nursing note reviewed.  Constitutional:      General: She is not in acute distress.    Appearance: She is well-developed. She is not ill-appearing or diaphoretic.  HENT:     Head: Normocephalic and atraumatic.     Right Ear:  External ear normal.     Left Ear: External ear normal.     Nose: Nose normal.     Mouth/Throat:     Pharynx: Oropharynx is clear. No pharyngeal swelling or oropharyngeal exudate.  Eyes:     General:        Right eye: No discharge.        Left eye: No discharge.     Extraocular Movements: Extraocular movements intact.     Pupils: Pupils are equal, round, and reactive to light.  Cardiovascular:     Rate and Rhythm: Normal rate and regular rhythm.     Heart sounds: Normal heart sounds.  Pulmonary:     Effort: Pulmonary effort is normal.     Breath sounds: Normal breath sounds.  Abdominal:     Palpations: Abdomen is soft.     Tenderness: There is abdominal tenderness in the epigastric area. There is no right CVA tenderness or left CVA tenderness.  Musculoskeletal:     Cervical back: Normal range of motion and neck supple.  Skin:    General: Skin is warm and dry.  Neurological:     Mental Status: She is alert.     Comments: CN 3-12 grossly intact. 5/5 strength in all 4 extremities. Grossly normal sensation. Normal finger to nose.   Psychiatric:        Mood and Affect: Mood is not anxious.     ED Results / Procedures / Treatments   Labs (all labs ordered are listed, but only abnormal results are displayed) Labs Reviewed  COMPREHENSIVE METABOLIC PANEL - Abnormal; Notable for the following components:      Result Value   Potassium 3.4 (*)    Calcium 8.8 (*)    All other components within normal limits  CBC - Abnormal; Notable for the following components:   WBC 3.8 (*)    All other components within normal limits  URINALYSIS, ROUTINE W REFLEX MICROSCOPIC - Abnormal; Notable for the following components:   APPearance HAZY (*)    Hgb urine dipstick SMALL (*)    Leukocytes,Ua LARGE (*)    Bacteria, UA RARE (*)    All other components within normal limits  GROUP A STREP BY PCR  LIPASE, BLOOD  I-STAT BETA HCG BLOOD, ED ([facility], [facility], [facility] ONLY)    EKG EKG  Interpretation  Date/Time:  Wednesday January 20 2020 08:24:44 EST Ventricular Rate:  80 PR Interval:  162 QRS Duration: 78 QT Interval:  378 QTC Calculation: 435 R Axis:   45 Text Interpretation: Normal sinus rhythm no acute ST/T changes no significant change since 2018 Confirmed by Pricilla Loveless [phone]) on 01/20/2020 8:39:34 AM  Radiology CT ABDOMEN PELVIS W CONTRAST  Result Date: 01/20/2020 CLINICAL DATA:  Acute epigastric abdominal pain. EXAM: CT ABDOMEN AND PELVIS WITH CONTRAST TECHNIQUE: Multidetector CT imaging of the abdomen and pelvis was performed using the standard protocol following bolus administration of intravenous contrast. CONTRAST:  OMNIPAQUE IOHEXOL 300 MG/ML  SOLN COMPARISON:  September 25, 2015. FINDINGS: Lower chest: No acute abnormality. Hepatobiliary: No focal liver abnormality is seen. Status post cholecystectomy. No biliary dilatation. Pancreas: Unremarkable. No pancreatic ductal dilatation or surrounding inflammatory changes. Spleen: Normal in size without focal abnormality. Adrenals/Urinary Tract: Adrenal glands are unremarkable. Kidneys are normal, without renal calculi, focal lesion, or hydronephrosis. Bladder is unremarkable. Stomach/Bowel: The stomach appears normal. There is no evidence of bowel obstruction or inflammation. Status post appendectomy. Vascular/Lymphatic: No significant vascular findings are present. No enlarged abdominal or pelvic lymph nodes. Reproductive: Uterus and bilateral adnexa are unremarkable. Other: No abdominal wall hernia or abnormality. No abdominopelvic ascites. Musculoskeletal: No acute or significant osseous findings. IMPRESSION: No acute abnormality seen in the abdomen or pelvis. Electronically Signed   By: Lupita Raider M.D.   On: 01/20/2020 14:19   DG ABD ACUTE 2+V W 1V CHEST  Result Date: 01/20/2020 CLINICAL DATA:  Epigastric abdominal pain EXAM: DG ABDOMEN ACUTE W/ 1V CHEST COMPARISON:  Chest radiograph dated 04/03/2017  FINDINGS: Lungs are clear.  No pleural effusion or pneumothorax. The heart is normal in size. Nonobstructive bowel gas pattern. No evidence of free air under the diaphragm on the upright view. Cholecystectomy clips. Visualized osseous structures are within normal limits. IMPRESSION: No evidence of acute cardiopulmonary disease. No evidence of small bowel obstruction or free air. Electronically Signed   By: Charline Bills M.D.   On: 01/20/2020 10:07    Procedures Procedures (including critical care time)  Medications Ordered in ED Medications  sodium chloride flush (NS) 0.9 % injection 3 mL (has no administration in time range)  sodium chloride 0.9 % bolus 1,000 mL (0 mLs Intravenous Stopped 01/20/20 1133)  morphine 4 MG/ML injection 4 mg (4 mg Intravenous Given 01/20/20 0934)  metoCLOPramide (REGLAN) injection 10 mg (10 mg Intravenous Given 01/20/20 0930)  alum & mag hydroxide-simeth (MAALOX/MYLANTA) 200-200-20 MG/5ML suspension 30 mL (30 mLs Oral Given 01/20/20 0935)    And  lidocaine (XYLOCAINE) 2 % viscous mouth solution 15 mL (15 mLs Oral Given 01/20/20 0935)  ondansetron (ZOFRAN) injection 4 mg (4 mg Intravenous Given 01/20/20 1034)  morphine 4 MG/ML injection 4 mg (4 mg Intravenous Given 01/20/20 1035)  famotidine (PEPCID) IVPB 20 mg premix (0 mg Intravenous Stopped 01/20/20 1607)  iohexol (OMNIPAQUE) 300 MG/ML solution 100 mL (100 mLs Intravenous Contrast Given 01/20/20 1406)    ED Course  I have reviewed the triage vital signs and the nursing notes.  Pertinent labs & imaging results that were available during my care of the patient were reviewed by me and considered in my medical decision making (see chart for details).    MDM Rules/Calculators/A&P                      Patient has had previous cholecystectomy and appendectomy.  Thus she was given supportive care for presumed gastritis.  However she continued to have vomiting.  Thus a CT was obtained but is negative.  I still think this is  probably gastritis no significant blood is seen.  Labs are otherwise reassuring.  She is now feeling better and will be treated with PPI and H2 blocker.  Follow-up  with GI PCP.  Return precautions discussed. Final Clinical Impression(s) / ED Diagnoses Final diagnoses:  Upper abdominal pain  Acute gastritis without hemorrhage, unspecified gastritis type    Rx / DC Orders ED Discharge Orders         Ordered    pantoprazole (PROTONIX) 40 MG tablet  Daily     01/20/20 1546    famotidine (PEPCID) 20 MG tablet  2 times daily     01/20/20 1546    ondansetron (ZOFRAN ODT) 4 MG disintegrating tablet  Every 8 hours PRN     01/20/20 1546           Pricilla Loveless, MD 01/20/20 1616

## 2020-01-20 NOTE — ED Triage Notes (Signed)
Pt arrives to ED from home with complaints of epigastric abdominal pain since yesterday that radiates across her back. Patient reports two episodes of vomiting and nausea. Patient states now today she has a headache and sore throat.

## 2020-01-20 NOTE — ED Notes (Signed)
Transported to CT 

## 2020-01-20 NOTE — Discharge Instructions (Signed)
If you develop worsening, continued, or recurrent abdominal pain, uncontrolled vomiting, fever, chest or back pain, or any other new/concerning symptoms then return to the ER for evaluation.   Do not take Ibuprofen/Advil/Aleve/Motrin/Goody Powders/Naproxen/BC powders/Meloxicam/Diclofenac/Indomethacin and other Nonsteroidal anti-inflammatory medications. Do not drink alcohol.   Si presenta dolor abdominal que empeora, contina o es recurrente, vmitos incontrolados, fiebre, dolor de [place] o de [employer], o cualquier otro sntoma nuevo o preocupante, regrese a la sala de emergencias para una evaluacin.  No tome Ibuprofeno / Advil / Aleve / Motrin / Polvos Goody / Naproxeno / Polvos BC / Meloxicam / Diclofenaco / Indometacina y otros medicamentos antiinflamatorios no esteroideos. No tomes alcohol.

## 2020-01-20 NOTE — ED Notes (Signed)
Pt vomiting, Dr. Criss Alvine made aware.

## 2020-01-21 MED FILL — FAMOTIDINE 20 MG TABS: 20 | 15 days supply | Qty: 30 | Fill #0

## 2020-01-21 MED FILL — ONDANSETRON ODT 4 MG TABLET: 4 | 3 days supply | Qty: 10 | Fill #0

## 2020-01-21 MED FILL — PANTOPRAZOLE SOD DR 40 MG T: 40 | 30 days supply | Qty: 30 | Fill #0

## 2020-01-22 IMAGING — US US OB COMP LESS 14 WK
1 series · 15 of 28 positions shown · non-contrast
Comparison: None.

CLINICAL DATA: 32 y/o F; intermittent lower abdominal pain for 2
days. Positive urine pregnancy test.

EXAM:
OBSTETRIC <14 WK ULTRASOUND
TECHNIQUE: Transabdominal ultrasound was performed for evaluation of the
gestation as well as the maternal uterus and adnexal regions.

[Series 1: us ob comp less 14 wk · 15 of 32 slices shown]
[im 1/32]
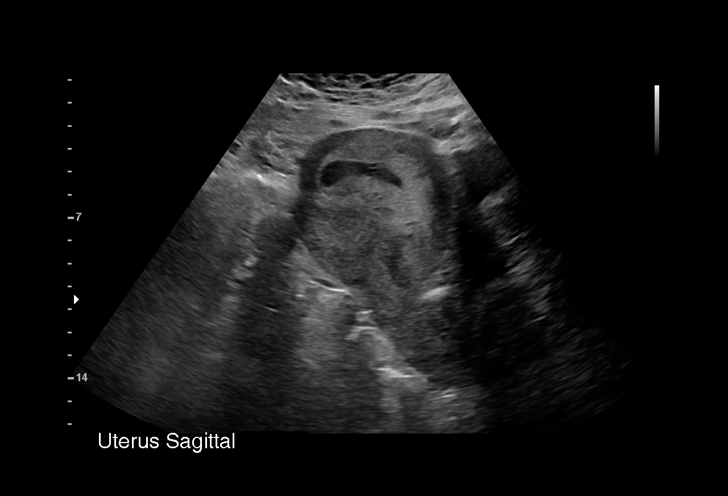
[im 3/32]
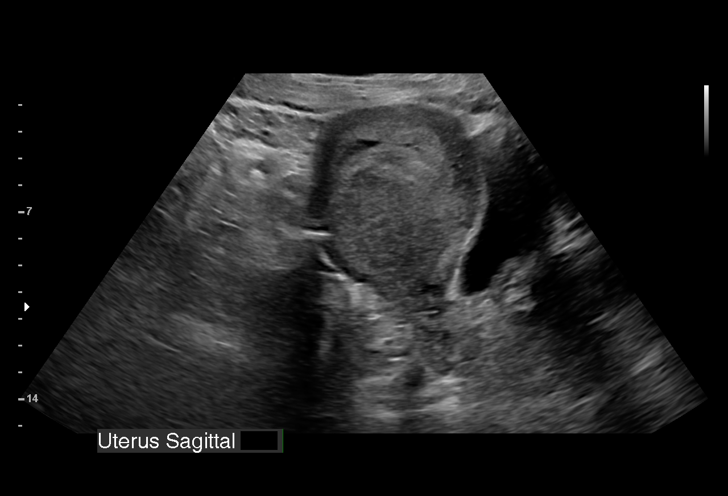
[im 5/32]
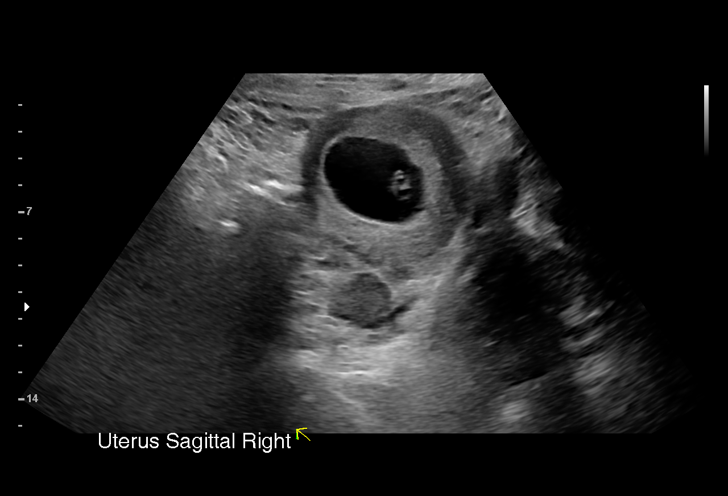
[im 7/32]
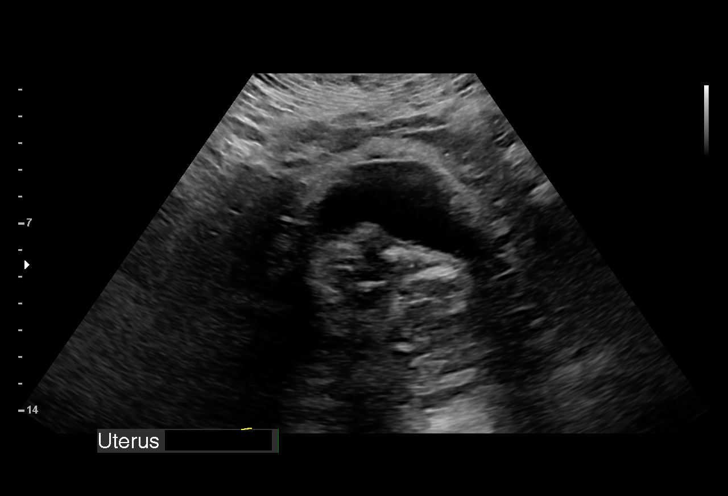
[im 10/32]
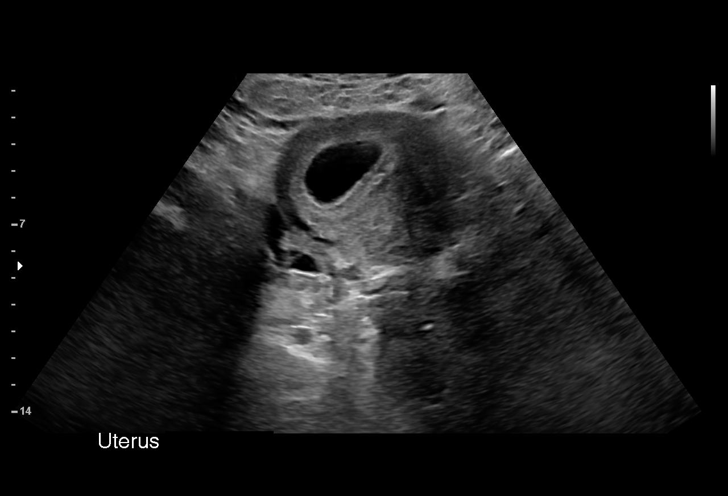
[im 12/32]
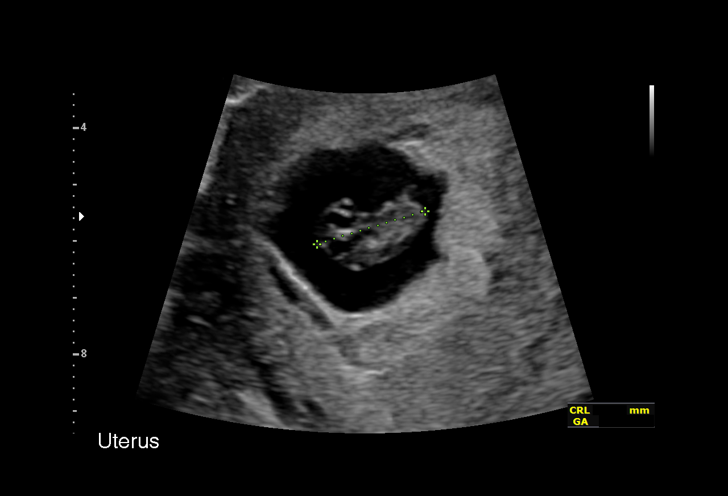
[im 14/32]
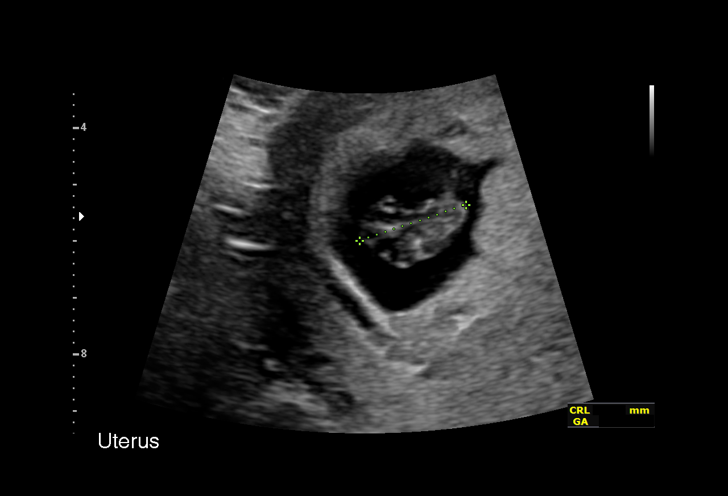
[im 17/32]
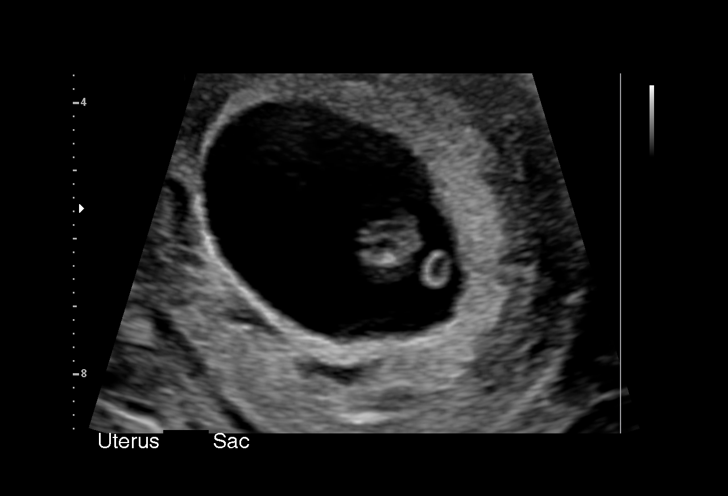
[im 18/32]
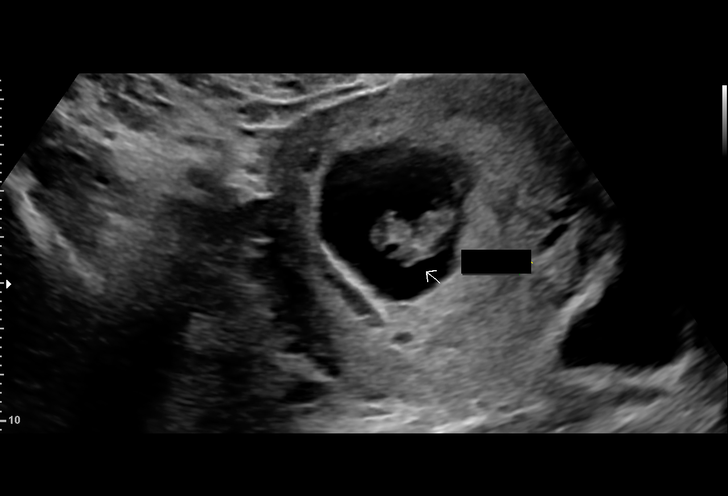
[im 20/32]
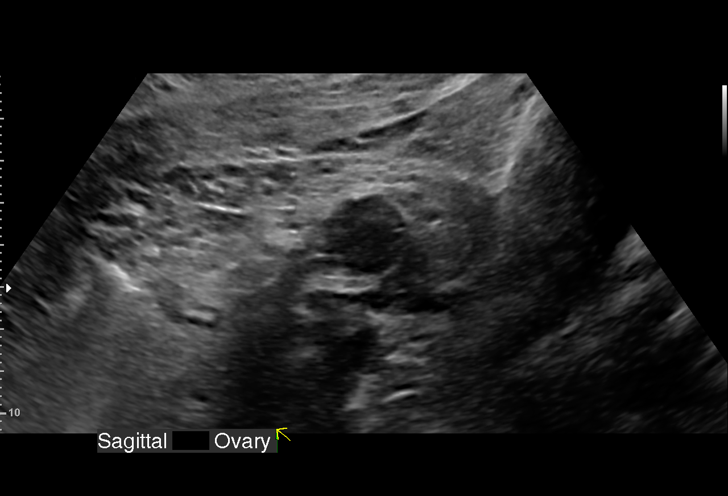
[im 22/32]
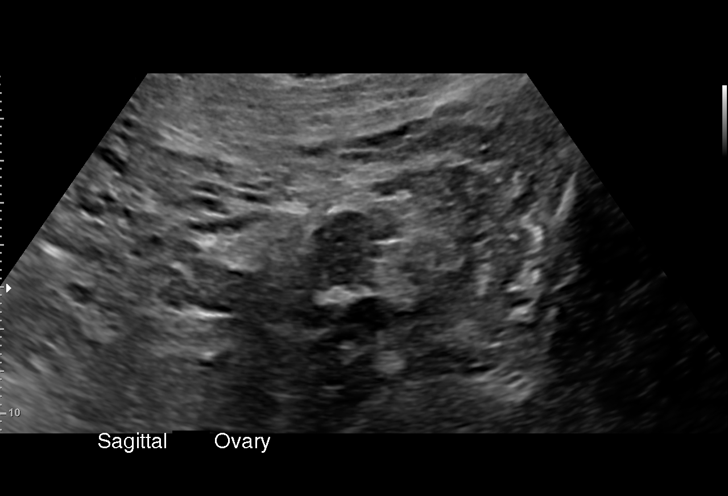
[im 25/32]
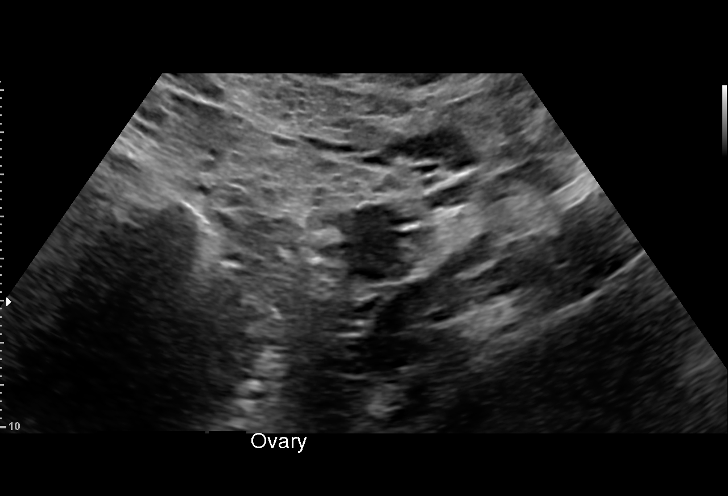
[im 27/32]
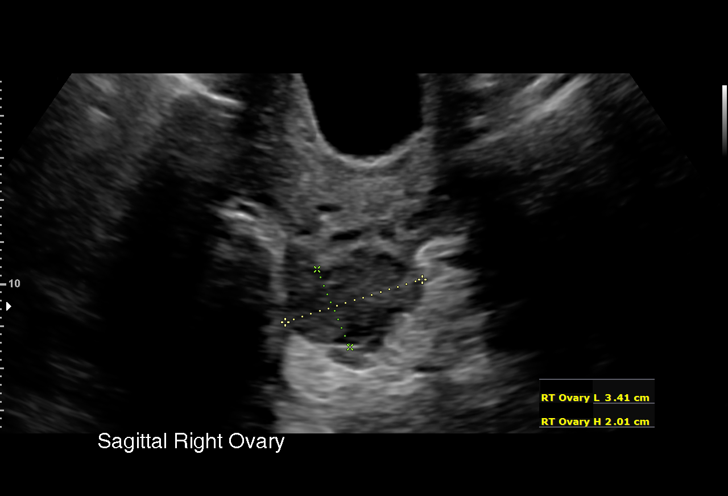
[im 29/32]
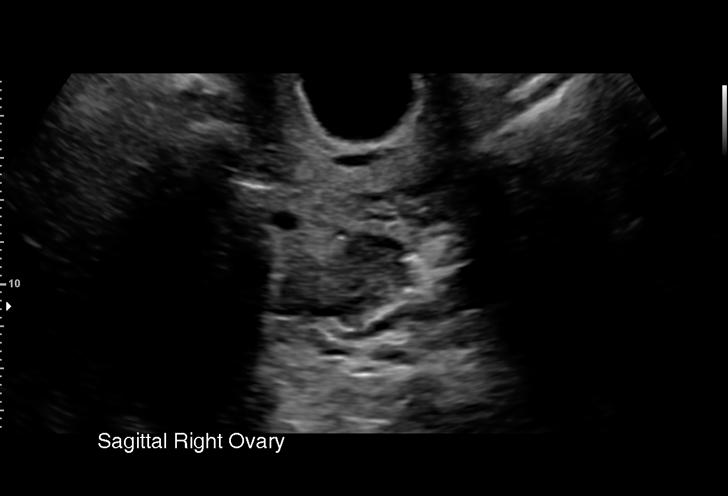
[im 32/32]
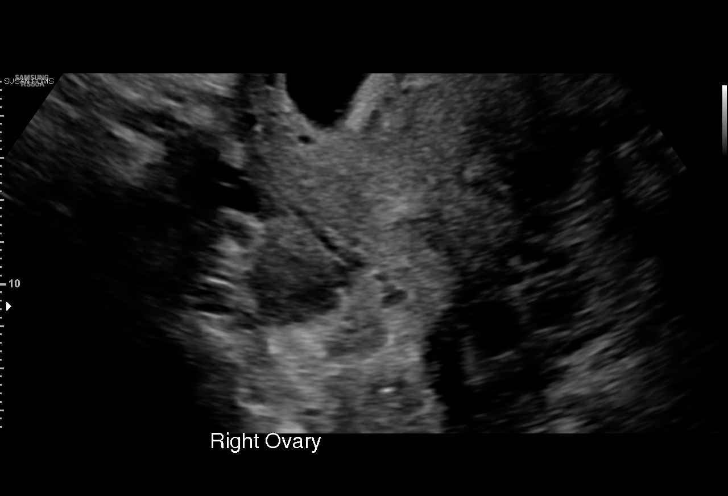

[15 of 28 positions shown; findings below may reference images not displayed]

FINDINGS: Intrauterine gestational sac: Single

Yolk sac:  Visualized.

Embryo:  Visualized.

Cardiac Activity: Visualized.

Heart Rate: 169 bpm

CRL: 19.8 mm 8 w 3 d                  US EDC: 09/28/2019

Subchorionic hemorrhage:  None visualized.

Maternal uterus/adnexae: Normal appearance of the bilateral adnexa
and the uterus. No free fluid in the pelvis.
IMPRESSION: Single live intrauterine pregnancy with estimated gestational age of
8 weeks and 3 days. No acute process identified.

## 2020-01-27 ENCOUNTER — Encounter: Payer: Self-pay | Admitting: Nurse Practitioner

## 2020-01-27 ENCOUNTER — Other Ambulatory Visit: Payer: Self-pay

## 2020-01-27 ENCOUNTER — Ambulatory Visit: Payer: Self-pay | Attending: Nurse Practitioner | Admitting: Nurse Practitioner

## 2020-01-27 DIAGNOSIS — R1013 Epigastric pain: Secondary | ICD-10-CM

## 2020-01-27 DIAGNOSIS — Z09 Encounter for follow-up examination after completed treatment for conditions other than malignant neoplasm: Secondary | ICD-10-CM

## 2020-01-27 MED ORDER — PANTOPRAZOLE SODIUM 40 MG PO TBEC: 40.0000 mg | DELAYED_RELEASE_TABLET | Freq: Every day | ORAL | 1 refills | Status: DC

## 2020-01-27 MED ORDER — FAMOTIDINE 20 MG PO TABS: 20.0000 mg | ORAL_TABLET | Freq: Two times a day (BID) | ORAL | 1 refills | Status: DC

## 2020-01-27 NOTE — Progress Notes (Signed)
Virtual Visit via Telephone Note Due to national recommendations of social distancing due to COVID 19, telehealth visit is felt to be most appropriate for this patient at this time.  I discussed the limitations, risks, security and privacy concerns of performing an evaluation and management service by telephone and the availability of in person appointments. I also discussed with the patient that there may be a patient responsible charge related to this service. The patient expressed understanding and agreed to proceed.    I connected with Amy Lloyd on 01/27/20  at  10:10 AM EST  EDT by telephone and verified that I am speaking with the correct person using two identifiers.   Consent I discussed the limitations, risks, security and privacy concerns of performing an evaluation and management service by telephone and the availability of in person appointments. I also discussed with the patient that there may be a patient responsible charge related to this service. The patient expressed understanding and agreed to proceed.   Location of Patient: Private  Residence   Location of Provider: [facility] [place] [facility]    Persons participating in Telemedicine visit: Amy Denver FNP-BC [facility] CMA [facility] Lloyd  Spanish Interpreter ID Porfirio Mylar [id number]   History of Present Illness: Telemedicine visit for: HFU  She was treated in the ED on 01-20-2020 for epigastric/abdominal pain and headache. Abdominal CT was negative.  Diagnosed with acute gastritis w/o hemorrhage. She has a history of cholecystectomy and appendectomy. She was prescribed zofran 4mg , pantoprazole 40 mg daily and pepcid 20 mg BID. Today she endorses significant improvement in her abdominal pain and continues to take H2 antagonist and PPI.     Past Medical History:  Diagnosis Date  . Gallbladder attack     Past Surgical History:  Procedure Laterality Date  . APPENDECTOMY  09/26/2015    "lap"  . CHOLECYSTECTOMY N/A 11/12/2017   Procedure: LAPAROSCOPIC CHOLECYSTECTOMY;  Surgeon: 11/14/2017, MD;  Location: [facility] OR;  Service: General;  Laterality: N/A;  . LAPAROSCOPIC APPENDECTOMY N/A 09/26/2015   Procedure: APPENDECTOMY LAPAROSCOPIC;  Surgeon: 11/26/2015, MD;  Location: [facility] OR;  Service: General;  Laterality: N/A;    History reviewed. No pertinent family history.  Social History   Socioeconomic History  . Marital status: Significant Other    Spouse name: Not on file  . Number of children: Not on file  . Years of education: Not on file  . Highest education level: Not on file  Occupational History  . Not on file  Tobacco Use  . Smoking status: Never Smoker  . Smokeless tobacco: Never Used  Substance and Sexual Activity  . Alcohol use: No  . Drug use: No  . Sexual activity: Yes    Birth control/protection: None    Comment: 10 July 20  Other Topics Concern  . Not on file  Social History Narrative  . Not on file   Social Determinants of Health   Financial Resource Strain:   . Difficulty of Paying Living Expenses: Not on file  Food Insecurity:   . Worried About 12 July in the Last Year: Not on file  . Ran Out of Food in the Last Year: Not on file  Transportation Needs:   . Lack of Transportation (Medical): Not on file  . Lack of Transportation (Non-Medical): Not on file  Physical Activity:   . Days of Exercise per Week: Not on file  . Minutes of Exercise per Session: Not on file  Stress:   .  Feeling of Stress : Not on file  Social Connections:   . Frequency of Communication with Friends and Family: Not on file  . Frequency of Social Gatherings with Friends and Family: Not on file  . Attends Religious Services: Not on file  . Active Member of Clubs or Organizations: Not on file  . Attends Archivist Meetings: Not on file  . Marital Status: Not on file     Observations/Objective: Awake, alert and oriented x  3   Review of Systems  Constitutional: Negative for fever, malaise/fatigue and weight loss.  HENT: Negative.  Negative for nosebleeds.   Eyes: Negative.  Negative for blurred vision, double vision and photophobia.  Respiratory: Negative.  Negative for cough and shortness of breath.   Cardiovascular: Negative.  Negative for chest pain, palpitations and leg swelling.  Gastrointestinal: Positive for heartburn. Negative for nausea and vomiting.  Musculoskeletal: Negative.  Negative for myalgias.  Neurological: Negative.  Negative for dizziness, focal weakness, seizures and headaches.  Psychiatric/Behavioral: Negative.  Negative for suicidal ideas.    Assessment and Plan: [name] was seen today for hospitalization follow-up.  Diagnoses and all orders for this visit:  Hospital discharge follow-up  Epigastric pain -     famotidine (PEPCID) 20 MG tablet; Take 1 tablet (20 mg total) by mouth 2 (two) times daily. -     pantoprazole (PROTONIX) 40 MG tablet; Take 1 tablet (40 mg total) by mouth daily.     Follow Up Instructions Return if symptoms worsen or fail to improve.     I discussed the assessment and treatment plan with the patient. The patient was provided an opportunity to ask questions and all were answered. The patient agreed with the plan and demonstrated an understanding of the instructions.   The patient was advised to call back or seek an in-person evaluation if the symptoms worsen or if the condition fails to improve as anticipated.  I provided 14 minutes of non-face-to-face time during this encounter including median intraservice time, reviewing previous notes, labs, imaging, medications and explaining diagnosis and management.  Gildardo Pounds, FNP-BC

## 2020-02-11 ENCOUNTER — Ambulatory Visit: Payer: Self-pay | Attending: Nurse Practitioner

## 2020-02-11 ENCOUNTER — Other Ambulatory Visit: Payer: Self-pay

## 2020-02-11 DIAGNOSIS — Z0000 Encounter for general adult medical examination without abnormal findings: Secondary | ICD-10-CM

## 2020-02-11 DIAGNOSIS — Z09 Encounter for follow-up examination after completed treatment for conditions other than malignant neoplasm: Secondary | ICD-10-CM

## 2020-02-12 LAB — LIPID PANEL
Chol/HDL Ratio: 3.9 ratio (ref 0.0–4.4)
Cholesterol, Total: 197 mg/dL (ref 100–199)
HDL: 50 mg/dL (ref >39)
LDL Chol Calc (NIH): 131 mg/dL — ABNORMAL HIGH (ref 0–99)
Triglycerides: 91 mg/dL (ref 0–149)
VLDL Cholesterol Cal: 16 mg/dL (ref 5–40)

## 2020-02-12 LAB — CBC
Hematocrit: 40.2 % (ref 34.0–46.6)
Hemoglobin: 13.7 g/dL (ref 11.1–15.9)
MCH: 29.7 pg (ref 26.6–33.0)
MCHC: 34.1 g/dL (ref 31.5–35.7)
MCV: 87 fL (ref 79–97)
Platelets: 189 10*3/uL (ref 150–450)
RBC: 4.61 x10E6/uL (ref 3.77–5.28)
RDW: 12.1 % (ref 11.7–15.4)
WBC: 4.4 10*3/uL (ref 3.4–10.8)

## 2020-02-12 LAB — CMP14+EGFR
ALT: 13 IU/L (ref 0–32)
AST: 14 IU/L (ref 0–40)
Albumin/Globulin Ratio: 1.4 (ref 1.2–2.2)
Albumin: 3.9 g/dL (ref 3.8–4.8)
Alkaline Phosphatase: 130 IU/L — ABNORMAL HIGH (ref 39–117)
BUN/Creatinine Ratio: 13 (ref 9–23)
BUN: 10 mg/dL (ref 6–20)
Bilirubin Total: 0.3 mg/dL (ref 0.0–1.2)
CO2: 22 mmol/L (ref 20–29)
Calcium: 8.8 mg/dL (ref 8.7–10.2)
Chloride: 106 mmol/L (ref 96–106)
Creatinine, Ser: 0.76 mg/dL (ref 0.57–1.00)
GFR calc Af Amer: 120 mL/min/{1.73_m2} (ref >59)
GFR calc non Af Amer: 104 mL/min/{1.73_m2} (ref >59)
Globulin, Total: 2.8 g/dL (ref 1.5–4.5)
Glucose: 81 mg/dL (ref 65–99)
Potassium: 4 mmol/L (ref 3.5–5.2)
Sodium: 139 mmol/L (ref 134–144)
Total Protein: 6.7 g/dL (ref 6.0–8.5)

## 2020-02-12 LAB — HEMOGLOBIN A1C
Est. average glucose Bld gHb Est-mCnc: 82 mg/dL
Hgb A1c MFr Bld: 4.5 % — ABNORMAL LOW (ref 4.8–5.6)

## 2020-02-15 ENCOUNTER — Telehealth: Payer: Self-pay | Admitting: Nurse Practitioner

## 2020-02-15 NOTE — Telephone Encounter (Signed)
Patient called back for lab results please call back.

## 2020-02-19 NOTE — Telephone Encounter (Signed)
Spoke to patient and informed on lab results. Pt. Understood.  Spanish interpreter assist with the call.

## 2020-02-24 ENCOUNTER — Ambulatory Visit: Payer: Self-pay | Attending: Nurse Practitioner

## 2020-02-24 ENCOUNTER — Other Ambulatory Visit: Payer: Self-pay

## 2020-03-08 ENCOUNTER — Telehealth: Payer: Self-pay | Admitting: Nurse Practitioner

## 2020-03-08 NOTE — Telephone Encounter (Signed)
Pt was sent a letter from financial dept. Inform them, that the application they submitted was incomplete, since they were missing some documentation at the time of the appointment, Pt need to reschedule and resubmit all new papers and application for [employer] and [employer], P.S. old documents has been sent back by mail to the Pt and Pt. need to make a new appt. 

## 2020-12-17 NOTE — L&D Delivery Note (Signed)
OB[facility]N [facility] Delivery Note  Amy Lloyd is a 35 y.o. [id number] s/p NSVD at [redacted]w[redacted]d. She was admitted for IOL due to polyhydramnios and LGA.   ROM: 4h 64m with clear fluid GBS Status: Positive; PCN  Delivery Date/Time: 08/02/21 at 0132  Delivery: Called to room and patient was complete and pushing. Head delivered ROA. No nuchal cord present. Shoulder and body delivered in usual fashion. Infant with spontaneous cry, placed on mother's abdomen, dried and stimulated. Cord clamped x 2 after 1-minute delay, and cut by delivering clinician. Cord blood drawn. Placenta delivered spontaneously with gentle cord traction after 32 minutes. Fundus firm with massage and Pitocin. Labia, perineum, vagina, and cervix were inspected. 1st degree perineal laceration present which was hemostatic and not repaired.   Placenta: Intact; 3 vessel cord Complications: None  Lacerations: 1st degree perineal; hemostatic and not repaired EBL: 200 cc Analgesia: Epidural  Infant: Viable female  APGARs 46 and 31  Evalina Field, MD OB/GYN Fellow, Faculty Practice

## 2020-12-29 ENCOUNTER — Encounter (HOSPITAL_COMMUNITY): Payer: Self-pay | Admitting: Obstetrics and Gynecology

## 2020-12-29 ENCOUNTER — Inpatient Hospital Stay (HOSPITAL_COMMUNITY)
Admission: AD | Admit: 2020-12-29 | Discharge: 2020-12-29 | Disposition: A | Discharge status: Home / Self Care | Payer: Self-pay | Attending: Obstetrics and Gynecology | Admitting: Obstetrics and Gynecology

## 2020-12-29 ENCOUNTER — Other Ambulatory Visit: Payer: Self-pay

## 2020-12-29 ENCOUNTER — Inpatient Hospital Stay (HOSPITAL_COMMUNITY): Payer: Self-pay

## 2020-12-29 ENCOUNTER — Other Ambulatory Visit (HOSPITAL_COMMUNITY): Payer: Self-pay

## 2020-12-29 DIAGNOSIS — R109 Unspecified abdominal pain: Secondary | ICD-10-CM

## 2020-12-29 DIAGNOSIS — O99891 Other specified diseases and conditions complicating pregnancy: Secondary | ICD-10-CM

## 2020-12-29 DIAGNOSIS — Z3A13 13 weeks gestation of pregnancy: Secondary | ICD-10-CM | POA: Insufficient documentation

## 2020-12-29 DIAGNOSIS — R12 Heartburn: Secondary | ICD-10-CM | POA: Insufficient documentation

## 2020-12-29 DIAGNOSIS — Z3A08 8 weeks gestation of pregnancy: Secondary | ICD-10-CM

## 2020-12-29 DIAGNOSIS — R1030 Lower abdominal pain, unspecified: Secondary | ICD-10-CM | POA: Insufficient documentation

## 2020-12-29 DIAGNOSIS — O26891 Other specified pregnancy related conditions, first trimester: Secondary | ICD-10-CM

## 2020-12-29 DIAGNOSIS — O26899 Other specified pregnancy related conditions, unspecified trimester: Secondary | ICD-10-CM

## 2020-12-29 DIAGNOSIS — O219 Vomiting of pregnancy, unspecified: Secondary | ICD-10-CM | POA: Insufficient documentation

## 2020-12-29 DIAGNOSIS — N760 Acute vaginitis: Secondary | ICD-10-CM | POA: Insufficient documentation

## 2020-12-29 DIAGNOSIS — B9689 Other specified bacterial agents as the cause of diseases classified elsewhere: Secondary | ICD-10-CM

## 2020-12-29 LAB — WET PREP, GENITAL
Sperm: NONE SEEN
Trich, Wet Prep: NONE SEEN
Yeast Wet Prep HPF POC: NONE SEEN

## 2020-12-29 LAB — URINALYSIS, ROUTINE W REFLEX MICROSCOPIC
Bilirubin Urine: NEGATIVE
Glucose, UA: NEGATIVE mg/dL
Hgb urine dipstick: NEGATIVE
Ketones, ur: NEGATIVE mg/dL
Nitrite: NEGATIVE
Protein, ur: NEGATIVE mg/dL
Specific Gravity, Urine: 1.023 (ref 1.005–1.030)
pH: 7 (ref 5.0–8.0)

## 2020-12-29 LAB — CBC
HCT: 41.4 % (ref 36.0–46.0)
Hemoglobin: 14.2 g/dL (ref 12.0–15.0)
MCH: 30.6 pg (ref 26.0–34.0)
MCHC: 34.3 g/dL (ref 30.0–36.0)
MCV: 89.2 fL (ref 80.0–100.0)
Platelets: 197 10*3/uL (ref 150–400)
RBC: 4.64 MIL/uL (ref 3.87–5.11)
RDW: 12 % (ref 11.5–15.5)
WBC: 8.4 10*3/uL (ref 4.0–10.5)
nRBC: 0 % (ref 0.0–0.2)

## 2020-12-29 LAB — POCT PREGNANCY, URINE: Preg Test, Ur: POSITIVE — AB

## 2020-12-29 LAB — HCG, QUANTITATIVE, PREGNANCY: hCG, Beta Chain, Quant, S: 146881 m[IU]/mL — ABNORMAL HIGH (ref <5)

## 2020-12-29 MED ORDER — ACETAMINOPHEN 500 MG PO TABS
1000.0000 mg | ORAL_TABLET | Freq: Once | ORAL | Status: AC
  Administered 2020-12-29: 1000 mg via ORAL
  Filled 2020-12-29: qty 2

## 2020-12-29 MED ORDER — METRONIDAZOLE 0.75 % VA GEL: 1.0000 | Freq: Every day | VAGINAL | 0 refills | Status: DC

## 2020-12-29 MED ORDER — ONDANSETRON 4 MG PO TBDP
8.0000 mg | ORAL_TABLET | Freq: Once | ORAL | Status: AC
  Administered 2020-12-29: 8 mg via ORAL
  Filled 2020-12-29: qty 2

## 2020-12-29 NOTE — MAU Provider Note (Signed)
History     CSN: [id number]  Arrival date and time: 12/29/20 1354   Event Date/Time   First Provider Initiated Contact with Patient 12/29/20 1637      Chief Complaint  Patient presents with  . Abdominal Pain  . Emesis   Amy Lloyd is a 35 y.o. G4P3003 at [redacted]w[redacted]d by Definite LMP of 09/24/2020, but she reports history of irregular periods.  She presents today for Abdominal Pain and Emesis.  She states she is having lower abdominal pain that started on Monday.  She describes the pain as cramping that is constant, while rating it a 8/10.  She states the pain goes across her lower abdomen.  She reports improvement, but not resolution with rest and has not identified any aggravating factors. Patient also reports heartburn and N/V with everything she eats.  She states this has been ongoing since Dec 30th.  She reports eating this morning eggs with ham with vomiting afterwards.  She reports using lemon and salted crackers for her nausea with some relief.    OB History    Gravida  4   Para  3   Term  3   Preterm      AB      Living  3     SAB      IAB      Ectopic      Multiple  0   Live Births  3           Past Medical History:  Diagnosis Date  . Gallbladder attack     Past Surgical History:  Procedure Laterality Date  . APPENDECTOMY  09/26/2015   "lap"  . CHOLECYSTECTOMY N/A 11/12/2017   Procedure: LAPAROSCOPIC CHOLECYSTECTOMY;  Surgeon: Emelia Loron, MD;  [facility];  Service: General;  Laterality: N/A;  . LAPAROSCOPIC APPENDECTOMY N/A 09/26/2015   Procedure: APPENDECTOMY LAPAROSCOPIC;  Surgeon: Emelia Loron, MD;  Location: [facility] OR;  Service: General;  Laterality: N/A;    History reviewed. No pertinent family history.  Social History   Tobacco Use  . Smoking status: Never Smoker  . Smokeless tobacco: Never Used  Vaping Use  . Vaping Use: Never used  Substance Use Topics  . Alcohol use: No  . Drug use: No    Allergies: No  Known Allergies  Medications Prior to Admission  Medication Sig Dispense Refill Last Dose  . acetaminophen (TYLENOL) 325 MG tablet Take 2 tablets (650 mg total) by mouth every 6 (six) hours as needed (for pain scale < 4). 90 tablet 0   . famotidine (PEPCID) 20 MG tablet Take 1 tablet (20 mg total) by mouth 2 (two) times daily. 90 tablet 1   . ferrous sulfate (FERROUSUL) 325 (65 FE) MG tablet Take 1 tablet (325 mg total) by mouth 2 (two) times daily. (Patient not taking: Reported on 01/20/2020) 60 tablet 1   . norethindrone (MICRONOR) 0.35 MG tablet Take 1 tablet (0.35 mg total) by mouth daily. (Patient not taking: Reported on 01/20/2020) 1 Package 11   . ondansetron (ZOFRAN ODT) 4 MG disintegrating tablet Take 1 tablet (4 mg total) by mouth every 8 (eight) hours as needed for nausea or vomiting. 10 tablet 0   . pantoprazole (PROTONIX) 40 MG tablet Take 1 tablet (40 mg total) by mouth daily. 90 tablet 1     Review of Systems  Constitutional: Negative for chills and fever.  Respiratory: Negative for cough and shortness of breath.   Gastrointestinal: Positive for abdominal pain and  nausea. Negative for vomiting.  Genitourinary: Positive for vaginal discharge (Monday-Light pink and watery. ). Negative for difficulty urinating, dysuria and vaginal bleeding.  Neurological: Positive for headaches (None currently). Negative for dizziness and light-headedness.   Physical Exam   Blood pressure 111/74, pulse 65, temperature 98.5 F (36.9 C), resp. rate 18, height 5' (1.524 m), weight 83 kg, last menstrual period 09/24/2020, unknown if currently breastfeeding.  Physical Exam Vitals reviewed.  Constitutional:      Appearance: She is well-developed.  HENT:     Head: Normocephalic and atraumatic.  Eyes:     Conjunctiva/sclera: Conjunctivae normal.  Cardiovascular:     Rate and Rhythm: Normal rate.     Heart sounds: Normal heart sounds.  Pulmonary:     Effort: Pulmonary effort is normal. No  respiratory distress.     Breath sounds: Normal breath sounds.  Abdominal:     General: Bowel sounds are normal.     Tenderness: There is abdominal tenderness (Mild) in the right lower quadrant, suprapubic area and left lower quadrant.  Musculoskeletal:     Cervical back: Normal range of motion.  Skin:    General: Skin is warm and dry.  Neurological:     Mental Status: She is alert and oriented to person, place, and time.  Psychiatric:        Mood and Affect: Mood normal.        Behavior: Behavior normal.        Thought Content: Thought content normal.     MAU Course  Procedures Results for orders placed or performed during the hospital encounter of 12/29/20 (from the past 24 hour(s))  Pregnancy, urine POC     Status: Abnormal   Collection Time: 12/29/20  3:00 PM  Result Value Ref Range   Preg Test, Ur POSITIVE (A) NEGATIVE  Urinalysis, Routine w reflex microscopic Urine, Clean Catch     Status: Abnormal   Collection Time: 12/29/20  3:02 PM  Result Value Ref Range   Color, Urine YELLOW YELLOW   APPearance HAZY (A) CLEAR   Specific Gravity, Urine 1.023 1.005 - 1.030   pH 7.0 5.0 - 8.0   Glucose, UA NEGATIVE NEGATIVE mg/dL   Hgb urine dipstick NEGATIVE NEGATIVE   Bilirubin Urine NEGATIVE NEGATIVE   Ketones, ur NEGATIVE NEGATIVE mg/dL   Protein, ur NEGATIVE NEGATIVE mg/dL   Nitrite NEGATIVE NEGATIVE   Leukocytes,Ua MODERATE (A) NEGATIVE   RBC / HPF 0-5 0 - 5 RBC/hpf   WBC, UA 11-20 0 - 5 WBC/hpf   Bacteria, UA RARE (A) NONE SEEN   Squamous Epithelial / LPF 6-10 0 - 5   Mucus PRESENT   CBC     Status: None   Collection Time: 12/29/20  3:55 PM  Result Value Ref Range   WBC 8.4 4.0 - 10.5 K/uL   RBC 4.64 3.87 - 5.11 MIL/uL   Hemoglobin 14.2 12.0 - 15.0 g/dL   HCT 16.1 09.6 - 04.5 %   MCV 89.2 80.0 - 100.0 fL   MCH 30.6 26.0 - 34.0 pg   MCHC 34.3 30.0 - 36.0 g/dL   RDW 40.9 81.1 - 91.4 %   Platelets 197 150 - 400 K/uL   nRBC 0.0 0.0 - 0.2 %  hCG, quantitative,  pregnancy     Status: Abnormal   Collection Time: 12/29/20  3:55 PM  Result Value Ref Range   hCG, Beta Chain, Quant, S 782,956 (H) <5 mIU/mL  Wet prep, genital  Status: Abnormal   Collection Time: 12/29/20  4:30 PM   Specimen: PATH Cytology Cervicovaginal Ancillary Only  Result Value Ref Range   Yeast Wet Prep HPF POC NONE SEEN NONE SEEN   Trich, Wet Prep NONE SEEN NONE SEEN   Clue Cells Wet Prep HPF POC PRESENT (A) NONE SEEN   WBC, Wet Prep HPF POC MANY (A) NONE SEEN   Sperm NONE SEEN    US OB Comp Less 14 Wks  Result Date: 12/29/2020 CLINICAL DATA:  Pregnant patient. Abdominal pain and cramping. Patient is 13 weeks and 5 days pregnant based on her last menstrual period. EXAM: OBSTETRIC <14 WK ULTRASOUND TECHNIQUE: Transabdominal ultrasound was performed for evaluation of the gestation as well as the maternal uterus and adnexal regions. COMPARISON:  None. FINDINGS: Intrauterine gestational sac: Single Yolk sac:  Visualized. Embryo:  Visualized. Cardiac Activity: Visualized. Heart Rate:  bpm CRL:   19.0 mm   8 w 2 d                  [place] EDC: 07/01/2021 Subchorionic hemorrhage:  None visualized. Maternal uterus/adnexae: No uterine masses. Closed cervix. Normal ovaries. No adnexal masses. No pelvic free fluid. IMPRESSION: 1. Single live intrauterine pregnancy with a measured gestational age of [redacted] weeks and 2 days. No complications. Electronically Signed   By: Amie Portland M.D.   On: 12/29/2020 17:16    MDM Pelvic Exam; Wet Prep and GC/CT Labs: UA, UPT, CBC, hCG, ABO Ultrasound Assessment and Plan  35 year old G4P3003 at 13.5 weeks Abdominal Pain  Language Barrier  -POC Reviewed -Exam performed and findings discussed. -Zofran given f/b tylenol for pain. -Cultures collected by nurse. -Labs ordered and pending. -Will send [place].  -Interpretations completed with assistance of in person interpreter-Amy Lloyd.   Cherre Robins 12/29/2020, 4:37 PM   Reassessment (6:01 PM) SIUP at 8.2  weeks  -Patient reports pain has improved slightly and now 6/10. -Informed of bacterial vaginosis and treatment.   -Metrogel sent to pharmacy on file.  -[place] results reviewed. Informed of change in [facility] to Aug 08, 2021. -Discussed anticipated improvement of abdominal cramping with treatment of BV. -Bleeding Precautions Given. -Patient offered and accepts provider list.  -Encouraged to call or return to MAU if symptoms worsen or with the onset of new symptoms. -Discharged to home in stable condition.  Cherre Robins MSN, CNM Advanced Practice Provider, [facility] [employer]

## 2020-12-29 NOTE — Discharge Instructions (Signed)
  [facility] Providers          [facility]  [address], [place], [place] [address]  [phone]  [facility]  [address], [place], [place] [address]  [phone]  [facility]  [address], [place], [place] [address]  [phone]  [facility] [place]  [address], [place], [place] [address]  [phone]  [facility]  [address]First floor), [place], [place] [address]  [phone]  [facility] [address], [place], [place] [address] [phone]  [facility]  [address], [place], [place] [address]  [phone]  [facility]  [address], [place], [place] [address]  [phone]  [facility]  [address], [place], [place] [address]  [phone]  [facility]  [address], [place], [place] [address]  [phone]  [facility]  [address], [place], [place] [address]  [phone]  [facility]  [address], [place], [place] [address]  [phone]  [facility]   [address], [place], [place] [address]  [phone]  [facility]  [address], [place], [place] [address]   [phone]  [facility]  [address], [place],  [address]  [phone]         

## 2020-12-29 NOTE — MAU Note (Signed)
Pt reports she has been having abd pain and cramping for the past 2 weeks. Also c/o N/V can't keep anything down. Had positive HPT. LMP 09/24/2020. Denies any vaginal bleeding or discharge.

## 2020-12-30 LAB — GC/CHLAMYDIA PROBE AMP (~~LOC~~) NOT AT ARMC
Chlamydia: NEGATIVE
Comment: NEGATIVE
Comment: NORMAL
Neisseria Gonorrhea: NEGATIVE

## 2020-12-30 MED FILL — METRONIDAZOLE 0.75 % GEL: 0.75 | 5 days supply | Qty: 70 | Fill #0

## 2021-03-15 ENCOUNTER — Other Ambulatory Visit (INDEPENDENT_AMBULATORY_CARE_PROVIDER_SITE_OTHER): Payer: Self-pay

## 2021-03-15 ENCOUNTER — Other Ambulatory Visit: Payer: Self-pay

## 2021-03-15 DIAGNOSIS — Z3481 Encounter for supervision of other normal pregnancy, first trimester: Secondary | ICD-10-CM

## 2021-03-15 DIAGNOSIS — Z3201 Encounter for pregnancy test, result positive: Secondary | ICD-10-CM

## 2021-03-15 LAB — POCT URINALYSIS DIP (MANUAL ENTRY)
Glucose, UA: NEGATIVE mg/dL
Ketones, POC UA: NEGATIVE mg/dL
Nitrite, UA: NEGATIVE
Protein Ur, POC: NEGATIVE mg/dL
Spec Grav, UA: 1.025 (ref 1.010–1.025)
Urobilinogen, UA: 4 E.U./dL — AB
pH, UA: 6.5 (ref 5.0–8.0)

## 2021-03-16 LAB — HEPATITIS C ANTIBODY: Hep C Virus Ab: <0.1 s/co ratio (ref 0.0–0.9)

## 2021-03-17 LAB — OBSTETRIC PANEL, INCLUDING HIV
Antibody Screen: NEGATIVE
Basophils Absolute: 0 10*3/uL (ref 0.0–0.2)
Basos: 0 %
EOS (ABSOLUTE): 0.1 10*3/uL (ref 0.0–0.4)
Eos: 1 %
HIV Screen 4th Generation wRfx: NONREACTIVE
Hematocrit: 34.3 % (ref 34.0–46.6)
Hemoglobin: 12.1 g/dL (ref 11.1–15.9)
Hepatitis B Surface Ag: NEGATIVE
Immature Grans (Abs): 0.1 10*3/uL (ref 0.0–0.1)
Immature Granulocytes: 1 %
Lymphocytes Absolute: 1.2 10*3/uL (ref 0.7–3.1)
Lymphs: 19 %
MCH: 32.5 pg (ref 26.6–33.0)
MCHC: 35.3 g/dL (ref 31.5–35.7)
MCV: 92 fL (ref 79–97)
Monocytes Absolute: 0.4 10*3/uL (ref 0.1–0.9)
Monocytes: 6 %
Neutrophils Absolute: 4.9 10*3/uL (ref 1.4–7.0)
Neutrophils: 73 %
Platelets: 165 10*3/uL (ref 150–450)
RBC: 3.72 x10E6/uL — ABNORMAL LOW (ref 3.77–5.28)
RDW: 13.2 % (ref 11.7–15.4)
RPR Ser Ql: NONREACTIVE
Rh Factor: POSITIVE
Rubella Antibodies, IGG: 12.6 index (ref >0.99)
WBC: 6.7 10*3/uL (ref 3.4–10.8)

## 2021-03-17 LAB — CULTURE, OB URINE

## 2021-03-17 LAB — HGB FRACTIONATION CASCADE
Hgb A2: 2.9 % (ref 1.8–3.2)
Hgb A: 96.8 % (ref 96.4–98.8)
Hgb F: 0.3 % (ref 0.0–2.0)
Hgb S: 0 %

## 2021-03-17 LAB — URINE CULTURE, OB REFLEX

## 2021-03-22 ENCOUNTER — Other Ambulatory Visit: Payer: Self-pay

## 2021-03-22 ENCOUNTER — Other Ambulatory Visit (HOSPITAL_COMMUNITY)
Admission: RE | Admit: 2021-03-22 | Discharge: 2021-03-22 | Disposition: A | Discharge status: Home / Self Care | Payer: Self-pay | Source: Ambulatory Visit | Attending: Family Medicine | Admitting: Family Medicine

## 2021-03-22 ENCOUNTER — Ambulatory Visit (INDEPENDENT_AMBULATORY_CARE_PROVIDER_SITE_OTHER): Payer: Self-pay | Admitting: Family Medicine

## 2021-03-22 VITALS — BP 100/62 | HR 92 | Wt 179.8 lb

## 2021-03-22 DIAGNOSIS — Z3A20 20 weeks gestation of pregnancy: Secondary | ICD-10-CM | POA: Insufficient documentation

## 2021-03-22 DIAGNOSIS — O0932 Supervision of pregnancy with insufficient antenatal care, second trimester: Secondary | ICD-10-CM

## 2021-03-22 LAB — POCT 1 HR PRENATAL GLUCOSE: Glucose 1 Hr Prenatal, POC: 129 mg/dL

## 2021-03-22 MED ORDER — ASPIRIN EC 81 MG PO TBEC
81.0000 mg | DELAYED_RELEASE_TABLET | Freq: Every day | ORAL | 11 refills | Status: DC
  Filled 2021-03-22: qty 30, 30d supply, fill #0

## 2021-03-22 MED ORDER — VITAMIN B-6 50 MG PO TABS
50.0000 mg | ORAL_TABLET | Freq: Every day | ORAL | 0 refills | Status: DC
  Filled 2021-03-22: qty 30, 30d supply, fill #0

## 2021-03-22 NOTE — Progress Notes (Signed)
Initial OB examination.  Patient complains of some nausea and vomiting, though this is improving.  Also with occasional headaches and breast tenderness.  Early 1 hour Glucola screening performed today.  Anatomy ultrasound ordered, to be done at health department.  Patient is adopted-a-mom.

## 2021-03-22 NOTE — Patient Instructions (Addendum)
Fue maravilloso verte hoy.  Por favor traiga TODOS sus medicamentos a cada visita.  Hoy hicimos una prueba para comprobar si hay diabetes.  Tambin me hice una prueba de Papanicolaou y un chequeo de infecciones vaginales o cervicales.  Le envi una receta a su farmacia de vitamina B6 que puede ayudar con las nuseas. Tambin recomendara que coma comidas pequeas y frecuentes a lo Paediatric nurse. Mantenga algunas galletas junto a su cama y puede comer esto tan pronto como se levante por la [place], a veces esto tambin ayuda. Trate de evitar los alimentos cidos. Sus nuseas Production assistant, radio a medida que Marketing executive.  Le programaremos una ecografa de anatoma. Le llamaremos con la fecha y la hora para esto.  Regrese en 4 semanas para otra cita prenatal.   Gracias por elegir Medicina familiar de .  Llame al [phone] si tiene alguna pregunta sobre la cita de [place].  Asegrese de programar un seguimiento en la recepcin antes de irse hoy.  Sabino Dick, D.O. PGY-1 Medicina Familiar    El [place] trimestre de [place] Second Trimester of Pregnancy  El segundo trimestre de embarazo va desde la semana 13 hasta la semana 27. Tambin se dice que va desde el mes 4 hasta el mes 6 de [place]. Este suele ser el momento en el que mejor se siente. Durante el segundo trimestre:  Las nuseas del embarazo han disminuido o han desaparecido.  Usted puede tener ms energa.  Usted puede tener hambre con ms frecuencia. En esta poca, el beb en gestacin (feto) crece muy rpido. Hacia el final del sexto mes, el beb en gestacin puede medir aproximadamente 12 pulgadas y pesar alrededor de 1 libras. Es probable que comience a Engineer, structural beb se [employer] las 16 y las 20 [address] de Psychiatrist. Cambios en el cuerpo durante el segundo trimestre Su organismo contina atravesando por muchos cambios durante este perodo. Los cambios varan y generalmente vuelven a la  normalidad despus del nacimiento del beb. Cambios fsicos  Aumentar ms peso.  Podrn aparecer las primeras estras en las caderas, el vientre (abdomen) y las [place].  Las [employer] Foods crecern y Writer.  Pueden aparecer zonas oscuras o manchas en el rostro.  Es posible que se forme una lnea oscura desde el ombligo hasta la zona del pubis (linea nigra).  Tal vez haya cambios en el cabello. Cambios en la salud  Es posible que tenga dolores de [place].  Es posible que tenga acidez estomacal.  Es posible que tenga dificultades para defecar (estreimiento).  Es posible que tenga hemorroides o venas abultadas e hinchadas (venas varicosas).  Las encas pueden sangrarle.  Es posible que haga pis (orine) con mayor frecuencia.  Puede sentir dolor en la espalda. Siga estas instrucciones en su casa: Medicamentos  Use los medicamentos de venta libre y los recetados solamente como se lo haya indicado el mdico. Algunos medicamentos no son seguros Academic librarian.  Tome vitaminas prenatales que contengan por lo menos (mcg) de cido flico. Comida y bebida  Consuma comidas saludables que incluyan lo siguiente: ? Nils Pyle y verduras frescas. ? Cereales integrales. ? Buenas fuentes de protenas, como carne, huevos y tofu. ? Productos lcteos con bajo contenido de grasa.  Evite la carne cruda y el [place], la [place] y el queso sin Market researcher.  Es posible que deba tomar medidas para prevenir o tratar los problemas para defecar: ? Product manager suficiente lquido para Radio producer pis (orina) de color amarillo plido. ?  Come alimentos ricos en fibra. Entre ellos, frijoles, cereales integrales y frutas y verduras frescas. ? Limitar los alimentos con alto contenido de grasa y International aid/development worker. Estos incluyen alimentos fritos o dulces. Actividad  Haga ejercicios solamente como se lo haya indicado el mdico. La mayora de las personas pueden realizar su actividad fsica habitual durante el  [place]. Intente realizar como mnimo de actividad fsica por lo menos 5das a la semana.  Deje de hacer ejercicio si tiene dolor o clicos en el vientre o en la zona lumbar.  No haga ejercicio si hace demasiado calor, hay demasiada humedad o se encuentra en un lugar de mucha altura (altitud elevada).  Evite levantar pesos [employer]ands.  Si lo desea, puede continuar teniendo [employer], a menos que el mdico le indique lo contrario. Alivio del dolor y del Dentist  Use un sostn que le brinde buen soporte si le duelen las [place].  Dese baos de asiento con agua tibia para Engineer, materials o las molestias causadas por las hemorroides. Use una crema para las hemorroides si el mdico la autoriza.  Descanse con las piernas levantadas (elevadas) si tiene calambres en las piernas o dolor en la parte baja de la espalda.  Si desarrolla venas abultadas en las piernas: ? Use medias de compresin segn las indicaciones de su mdico. ? Levante los pies durante , 3 o 4veces por Futures trader. ? Limite la sal en sus alimentos. Seguridad  Use el cinturn de seguridad en todo momento mientras vaya en auto.  Hable con el mdico si alguien le est haciendo dao o gritando [place]. Estilo de vida  No se d baos de inmersin en agua caliente, baos turcos ni saunas.  No se haga duchas vaginales. No use tampones ni toallas higinicas perfumadas.  Evite el contacto con las bandejas sanitarias de los gatos y la tierra que estos animales usan. Estos contienen grmenes que pueden daar al beb y causar la prdida del beb ya sea aborto espontneo o muerte fetal.  No consuma medicamentos a base de hierbas, drogas ilegales, ni medicamentos que el mdico no haya autorizado. No beba alcohol.  No fume ni consuma ningn producto que contenga nicotina o tabaco. Si necesita ayuda para dejar de fumar, consulte al mdico. Instrucciones generales  Cumpla con todas las visitas de seguimiento.  Esto es importante.  Consulte a su mdico acerca de dnde se dictan clases prenatales cerca de donde vive.  Consulte a su mdico sobre los [employer]oco[employer]illips debe comer o pdale que la ayude a Clinical research associate a Facilities manager. Dnde buscar ms informacin  American Pregnancy Association (Asociacin Americana del [name]): americanpregnancy.org  [employer] Corporation of Obstetricians and Gynecologists (Colegio Estadounidense de Obstetras y Gineclogos): www.acog.org  Office on Pit[employer]es (Oficina para la Salud de la Mujer): MightyReward.co.nz Comunquese con un mdico si:  Tiene un dolor de cabeza que no desaparece despus de Science writer.  Nota cambios en la visin o ve manchas delante de los ojos.  Tiene clicos o siente presin o dolor leves en la parte baja del vientre.  Sigue sintiendo como si fuera a vomitar (nuseas), vomita o hace deposiciones acuosas (diarrea).  Advierte lquido con mal olor que proviene de la vagina.  Siente dolor al orinar o hace orina con mal olor.  Tiene una gran hinchazn en la cara, las manos, las piernas, los tobillos o los pies.  Tiene fiebre. Solicite ayuda de inmediato si:  Tiene una prdida de lquido por la vagina.  Tiene sangrado o pequeas prdidas  vaginales.  Tiene clicos o dolor muy intensos en el vientre.  Tiene dificultad para respirar.  Sientes dolor en el pecho.  Se desmaya.  No ha sentido que el beb se moviera durante el perodo de tiempo que le dijo el mdico.  Licensed conveyancer, hinchazn o enrojecimiento nuevos en un brazo o una pierna o se produce un aumento de alguno de estos sntomas. Resumen  El segundo trimestre de embarazo va desde la semana 13 hasta la 27 (desde el mes 4 hasta el 6).  Consuma comidas saludables.  Haga ejercicios tal como le indic el mdico. La mayora de las personas pueden realizar su actividad fsica habitual durante el [place].  No consuma medicamentos a base de hierbas, drogas ilegales, ni  medicamentos que el mdico no haya autorizado. No beba alcohol.  Llame al mdico si se enferma o si nota algo inusual acerca de su embarazo. Esta informacin no tiene Theme park manager el consejo del mdico. Asegrese de hacerle al mdico cualquier pregunta que tenga. Document Revised: 06/17/2020 Document Reviewed: 06/17/2020 Elsevier Patient Education  2021 [employer].      It was wonderful to see you today.  Please bring ALL of your medications with you to every visit.   Today we did a screen to check for diabetes.  I also did a Pap smear and a check for any vaginal or cervical infections.   I sent a prescription to your pharmacy for Vitamin B6 which can help with nausea. I would also recommend that you eat small frequent meals throughout the day. Keep some crackers by your bed and you can eat this as soon as you get up in the morning, sometimes this also helps. Try to avoid acidic foods. Your nausea should be improving the farther along you get in pregnancy.  We will schedule you for an anatomy ultrasound. We will call you with the date and time for this.   Please return in 4 weeks for another prenatal appointment.   Thank you for choosing [facility].   Please call [phone] with any questions about today's appointment.  Please be sure to schedule follow up at the front  desk before you leave today.   Sabino Dick, DO PGY-1 Family Medicine

## 2021-03-22 NOTE — Progress Notes (Addendum)
Patient Name: Ryleigh Ramirez-Velazquez Date of Birth: 1986/07/10 [facility] Initial Prenatal Visit  Amerika Nourse is a 35 y.o. year old G4P3003 at [redacted]w[redacted]d who presents for her initial prenatal visit. Pregnancy is not planned but it is welcomed. She reports breast tenderness, nausea and headaches, vomiting. She is taking a prenatal vitamin.  She denies pelvic pain or vaginal bleeding.   Pregnancy Dating: . The patient is dated by early U/S.  . LMP: 09/24/20 . Period is certain:  Yes.  . Periods were regular:  No.  . LMP was a typical period:  No. Delayed, sometimes without period for 6 months.  Using hormonal contraception in 3 months prior to conception: Yes ; finished them at the end of October.  Lab Review: . Blood type: O . Rh Status: + . Antibody screen: Negative . HIV: Negative . RPR: Negative . Hemoglobin electrophoresis reviewed: Yes . Results of OB urine culture are: Negative . Rubella: Immune . Hep C Ab: Negative . Varicella status is Not immune  PMH: Reviewed and as detailed below: . HTN: No  . Gestational Hypertension/preeclampsia: No  . Type 1 or 2 Diabetes: No  . Depression:  No  . Seizure disorder:  No . VTE: No ,  . History of STI Yes, gonorrhea thinks it was in 2018 . Abnormal Pap smear:  No. . Genital herpes simplex:  No   PSH: . Gynecologic Surgery:  no . Surgical history reviewed, notable for: Appendix and gallbladder  Obstetric History: . Obstetric history tab updated and reviewed.  . Summary of prior pregnancies: G3P3003 o #1 11/13/04: Term, vaginal, female, living o #2 05/15/12: [redacted]w[redacted]d Term, vaginal, female, 2.951 kg, living o #3 1016/20: [redacted]w[redacted]d, Term, vaginal, female, 3.86 kg, living . Cesarean delivery: No  . Gestational Diabetes:  No . Hypertension in pregnancy: No . History of preterm birth: No . History of LGA/SGA infant:  No . History of shoulder dystocia: No . Indications for referral were reviewed, and the  patient has no obstetric indications for referral to High [facility] Clinic at this time.   Social History: . Partner's name: Fayne Mediate   . Tobacco use: No . Alcohol use:  No . Other substance use:  No  Current Medications:  . None  . Reviewed and appropriate in pregnancy.   Genetic and Infection Screen: . Flow Sheet Updated Yes  Prenatal Exam: Gen: Well nourished, well developed.  No distress.  Vitals noted. HEENT: Normocephalic, atraumatic.  Neck supple without cervical lymphadenopathy, thyromegaly or thyroid nodules.  Fair dentition. CV: RRR no murmur, gallops or rubs Lungs: CTA B.  Normal respiratory effort without wheezes or rales. Abd: soft, NTND. +BS.  Uterus not appreciated above pelvis. GU: Normal external female genitalia without lesions.  Nl vaginal, well rugated without lesions. Some white vaginal discharge appreciated. Fundal height 20 cm Ext: No clubbing, cyanosis or edema. Psych: Normal grooming and dress.  Not depressed or anxious appearing.  Normal thought content and process without flight of ideas or looseness of associations  Fetal heart tones: Appropriate  Assessment/Plan:  Skylyn Slezak is a 35 y.o. G4P3003 at [redacted]w[redacted]d who presents to initiate prenatal care. She is doing well.  Current pregnancy issues include some nausea and vomiting that is improving, occasional headaches and breast tenderness.  1. Routine prenatal care: Marland Kitchen As dating is reliable, a dating ultrasound has not been ordered. Dating tab updated. . Pre-pregnancy weight updated. Expected weight gain this pregnancy is 11-20 pounds  . Prenatal labs reviewed,  notable for clue cells and many WBC on prior wet prep (patient did not pick up Rx). . Indications for referral to [facility] were reviewed and the patient does not meet criteria for referral.  . Medication list reviewed and updated.  . Recommended patient see a dentist for regular care.  . Bleeding and pain precautions reviewed. . Importance  of prenatal vitamins reviewed.  . Genetic screening offered. Patient opted for: no screening. . The patient does have an indication for aspirin therapy beginning at 12-16 weeks. Aspirin was  recommended today.  . The patient will not be age 63 or over at time of delivery. Referral to genetic counseling was not offered today.  . The patient has the following risk factors for preexisting diabetes: BMI > 25 and high risk ethnicity (Latino, [place] American, Native American, [place] Islander, Asian Naval architect) . An early 1 hour glucose tolerance test was ordered. . Pregnancy Medical Home form completed, problems noted: No; Patient did not fill out PHQ-9  2. Pregnancy issues include the following which were addressed today:  . Nausea, vomiting: Prescription for vitamin B6 sent to pharmacy.  Counseled on ways to improve this by eating small snacks throughout the day, avoiding acidic foods, eating crackers in the morning before getting up.  . 1-hour Glucola: 129, WNL. Marland Kitchen Anatomy U/S form faxed to HD   Follow up 4 weeks for next prenatal visit.

## 2021-03-23 LAB — CERVICOVAGINAL ANCILLARY ONLY
Bacterial Vaginitis (gardnerella): POSITIVE — AB
Candida Glabrata: NEGATIVE
Candida Vaginitis: NEGATIVE
Comment: NEGATIVE
Comment: NEGATIVE
Comment: NEGATIVE

## 2021-03-24 LAB — CYTOLOGY - PAP
Chlamydia: NEGATIVE
Comment: NEGATIVE
Comment: NEGATIVE
Comment: NEGATIVE
Comment: NORMAL
Diagnosis: NEGATIVE
High risk HPV: NEGATIVE
Neisseria Gonorrhea: NEGATIVE
Trichomonas: NEGATIVE

## 2021-03-27 ENCOUNTER — Other Ambulatory Visit: Payer: Self-pay | Admitting: Family Medicine

## 2021-03-27 ENCOUNTER — Other Ambulatory Visit: Payer: Self-pay

## 2021-03-27 DIAGNOSIS — O23599 Infection of other part of genital tract in pregnancy, unspecified trimester: Secondary | ICD-10-CM

## 2021-03-27 DIAGNOSIS — B9689 Other specified bacterial agents as the cause of diseases classified elsewhere: Secondary | ICD-10-CM

## 2021-03-27 MED ORDER — METRONIDAZOLE 500 MG PO TABS
500.0000 mg | ORAL_TABLET | Freq: Two times a day (BID) | ORAL | 0 refills | Status: DC
  Filled 2021-03-27: qty 21, 11d supply, fill #0

## 2021-03-27 NOTE — Progress Notes (Signed)
Rx Metronidazole 500 mg BID x7 days for BV infection in pregnancy. Able to discuss with patient using telephone interpreter: [facility] [phone].

## 2021-03-30 ENCOUNTER — Encounter (HOSPITAL_COMMUNITY): Payer: Self-pay | Admitting: Obstetrics and Gynecology

## 2021-03-30 ENCOUNTER — Inpatient Hospital Stay (HOSPITAL_COMMUNITY)
Admission: AD | Admit: 2021-03-30 | Discharge: 2021-03-30 | Disposition: A | Discharge status: Home / Self Care | Payer: Self-pay | Attending: Obstetrics and Gynecology | Admitting: Obstetrics and Gynecology

## 2021-03-30 ENCOUNTER — Other Ambulatory Visit: Payer: Self-pay

## 2021-03-30 DIAGNOSIS — O26899 Other specified pregnancy related conditions, unspecified trimester: Secondary | ICD-10-CM

## 2021-03-30 DIAGNOSIS — Y92009 Unspecified place in unspecified non-institutional (private) residence as the place of occurrence of the external cause: Secondary | ICD-10-CM | POA: Insufficient documentation

## 2021-03-30 DIAGNOSIS — O9A212 Injury, poisoning and certain other consequences of external causes complicating pregnancy, second trimester: Secondary | ICD-10-CM

## 2021-03-30 DIAGNOSIS — W19XXXA Unspecified fall, initial encounter: Secondary | ICD-10-CM

## 2021-03-30 DIAGNOSIS — R519 Headache, unspecified: Secondary | ICD-10-CM | POA: Insufficient documentation

## 2021-03-30 DIAGNOSIS — R109 Unspecified abdominal pain: Secondary | ICD-10-CM

## 2021-03-30 DIAGNOSIS — W010XXA Fall on same level from slipping, tripping and stumbling without subsequent striking against object, initial encounter: Secondary | ICD-10-CM | POA: Insufficient documentation

## 2021-03-30 DIAGNOSIS — O26892 Other specified pregnancy related conditions, second trimester: Secondary | ICD-10-CM | POA: Insufficient documentation

## 2021-03-30 DIAGNOSIS — G44209 Tension-type headache, unspecified, not intractable: Secondary | ICD-10-CM

## 2021-03-30 DIAGNOSIS — Z3A21 21 weeks gestation of pregnancy: Secondary | ICD-10-CM | POA: Insufficient documentation

## 2021-03-30 LAB — URINALYSIS, ROUTINE W REFLEX MICROSCOPIC
Bilirubin Urine: NEGATIVE
Glucose, UA: NEGATIVE mg/dL
Hgb urine dipstick: NEGATIVE
Ketones, ur: NEGATIVE mg/dL
Nitrite: NEGATIVE
Protein, ur: NEGATIVE mg/dL
Specific Gravity, Urine: 1.018 (ref 1.005–1.030)
pH: 7 (ref 5.0–8.0)

## 2021-03-30 MED ORDER — ACETAMINOPHEN 500 MG PO TABS
1000.0000 mg | ORAL_TABLET | Freq: Once | ORAL | Status: AC
  Administered 2021-03-30: 1000 mg via ORAL
  Filled 2021-03-30: qty 2

## 2021-03-30 MED ORDER — TRAMADOL HCL 50 MG PO TABS
50.0000 mg | ORAL_TABLET | Freq: Once | ORAL | Status: DC
  Filled 2021-03-30: qty 1

## 2021-03-30 MED ORDER — CYCLOBENZAPRINE HCL 5 MG PO TABS
10.0000 mg | ORAL_TABLET | Freq: Once | ORAL | Status: AC
  Administered 2021-03-30: 10 mg via ORAL
  Filled 2021-03-30: qty 2

## 2021-03-30 NOTE — MAU Provider Note (Addendum)
Event Date/Time   First Provider Initiated Contact with Patient 03/30/21 2041     S Ms. Amy Lloyd is a 35 y.o. [phone] pregnant female at [redacted]w[redacted]d who presents to MAU today post-fall this afternoon. She was carrying laundry, tripped over toys and fell. Tried to avoid falling directly on her stomach, but hit it on an armrest. Had a headache prior to the fall, it got stronger after the fall. Also having some lower abdominal and lower back soreness/pain. Denies cramping, vaginal bleeding, loss of fluid, decreased fetal movement, fever or recent illness. No other physical complaints.  Spanish interpreter Wallene Huh) present for interpretation services  Pertinent items noted in HPI and remainder of comprehensive ROS otherwise negative.  O BP 105/70 (BP Location: Right Arm)   Pulse 78   Temp 99.1 F (37.3 C) (Oral)   Resp 17   Ht 5' (1.524 m)   Wt 181 lb 8 oz (82.3 kg)   LMP 09/24/2020   SpO2 100%   BMI 35.45 kg/m  Physical Exam Vitals and nursing note reviewed.  Constitutional:      General: She is not in acute distress.    Appearance: She is not ill-appearing.  HENT:     Head: Normocephalic and atraumatic.  Eyes:     Pupils: Pupils are equal, round, and reactive to light.  Cardiovascular:     Rate and Rhythm: Normal rate.  Pulmonary:     Effort: Pulmonary effort is normal.  Abdominal:     Palpations: Abdomen is soft.     Tenderness: There is abdominal tenderness (very mild tenderness over the area she hit). There is no guarding.     Comments: gravid  Musculoskeletal:        General: Normal range of motion.     Cervical back: Normal range of motion.  Skin:    General: Skin is warm and dry.     Capillary Refill: Capillary refill takes less than 2 seconds.     Findings: No erythema or rash.  Neurological:     Mental Status: She is alert and oriented to person, place, and time.     Sensory: No sensory deficit.     Gait: Gait normal.  Psychiatric:        Mood and  Affect: Mood normal.        Speech: Speech normal.        Behavior: Behavior normal.    FHT: 147  Tylenol and flexeril given for headache and back/pelvic soreness.  Care turned over to Wynelle Bourgeois, CNM at [address], CNM, MSN, IBCLC  A   P Discharge from MAU in stable condition No follow-ups on file. Warning signs for worsening condition that would warrant emergency follow-up discussed Patient may return to MAU as needed for emergent OB/GYN related complaints  Bernerd Limbo, CNM 03/30/2021 8:56 PM   Felt better after medicaiton for headache, pain went from a 9 to a 3.  A:  Single IUP at [redacted]w[redacted]d       S/P fall       Headache, acute P: Discharge home Followup as scheduled.  Encouraged to return if she develops worsening of symptoms, increase in pain, fever, or other concerning symptoms.    Aviva Signs, CNM

## 2021-03-30 NOTE — MAU Note (Signed)
She was holding her laundry and tripped over her daughters toys. She tried to prevent herself from falling too hard but during the fall she hit her abdomen with the armrest of a wooden chair. She also reports a throbbing headache, that feels as if it is swollen or hot, she has had this headache before and was given aspirin for it by her OB but has not found relief. Today the headache was aggravated by the fall. Denies vaginal bleeding or leaking of fluid.   Pain score: abdominal cramping 7/10. Headache 9/10 . Today's Vitals   03/30/21 2010 03/30/21 2012  BP: 105/70   Pulse: 78   Resp: 17   Temp: 99.1 F (37.3 C)   TempSrc: Oral   SpO2: 100%   Weight: 82.3 kg   Height: 5' (1.524 m)   PainSc:  7    Body mass index is 35.45 kg/m.

## 2021-03-30 NOTE — Discharge Instructions (Signed)
Prevencin de cadas en Engineer, mining, en adultos Fall Prevention in the Home, Adult Las cadas pueden causar lesiones y pueden ocurrirles a personas de todas las edades. Hay muchas cosas que puede hacer para que su casa sea un lugar seguro y para ayudar a prevenir las cadas. Pida ayuda cuando haga estos cambios. Qu medidas puedo tomar para prevenir cadas? Indicaciones generales  Use una buena iluminacin en todos los [place]. Reemplace las bombillas que se hayan quemado.  Encienda las luces en zonas oscuras. Use luces nocturnas.  Mantenga los objetos que [place] con frecuencia en lugares de fcil acceso. Baje los estantes de toda la casa de ser necesario.  Organice los muebles de modo de que haya espacio para caminar a su alrededor. Evite cambiar los [employer] de Environmental consultant.  No tenga alfombras ni otros objetos en el piso que puedan hacerlo tropezar.  Evite caminar sobre pisos mojados.  Si los pisos estn desparejos, reprelos.  Agregue pintura o cinta de color o contraste para Scientist, clinical (histocompatibility and immunogenetics) con claridad y poder ver: ? Las barras para sostn o los pasamanos. ? El Psychologist, educational y el ltimo escaln de las escaleras. ? Dnde est el borde de cada escaln.  Si [place] una escalera de mano: ? Asegrese de que est abierta por completo. No suba a una escalera de mano cerrada. ? Asegrese de que ambos lados de la escalera de mano estn asegurados en su lugar. ? Pdale a alguien que Comptroller de mano mientras usted la [place].  Sepa dnde estn sus mascotas cuando se desplace por su casa. Qu puedo hacer en el bao?  Mantenga el piso seco. Limpie de inmediato el agua del piso.  Elimine la acumulacin de jabn en la baera o la ducha.  Utilice alfombras o pegatinas antideslizantes en el piso de la baera o ducha.  Asegure las alfombras del bao con una cinta antideslizante doble faz para alfombras.  Si necesita sentarse cuando se ducha, use un banco plstico antideslizante.  Instale barras para sostn  al lado del inodoro, en la baera y en la ducha. No use los toalleros como barras de apoyo.      Qu puedo hacer en el dormitorio?  Asegrese de tener una luz junto a la cama que sea fcil de Science writer.  No use sbanas ni mantas que sean demasiado grandes para la cama y caigan al piso.  Tenga una silla firme con brazos laterales que pueda usar como apoyo cuando se vista. Qu puedo hacer en la cocina?  Limpie de inmediato cualquier derrame.  Si necesita alcanzar algo que est [employer], use un banco escalera con una barra de apoyo.  Mantenga los cables elctricos fuera del camino.  No use un pulidor o cera para pisos que dejen los pisos resbaladizos. Qu puedo hacer con las escaleras?  No deje ningn objeto en las escaleras.  Asegrese de tener un interruptor de luz en la parte superior e inferior de las escaleras.  Asegrese de que haya pasamanos en ambos lados de las escaleras. Repare los pasamanos que estn flojos o rotos.  Instale peldaos antideslizantes en todas las escaleras.  No coloque alfombras en la parte superior o inferior de las escaleras.  Elija una alfombra que no oculte el borde de los escalones de las escaleras.  Verifique las alfombras para asegurarse de estn bien adheridas a las escaleras. [place]. Qu puedo hacer en el exterior de mi casa?  Use una iluminacin brillante en el exterior.  Arregle los  bordes de los senderos y las entradas para autos as Optometrist.  Retire las cosas que pueden hacerlo tropezar cuando cruce una puerta, por ejemplo, un escaln o un umbral elevados.  Pode los arbustos o los rboles que se encuentran en los senderos hacia su casa.  Compruebe si los pasamanos estn sueltos o rotos, y si ambos lados de todos los escalones tienen pasamanos.  Instale barandillas de proteccin en los bordes de las terrazas y Soil scientist.  Retire los [employer] que estn en el camino y que puedan hacer  que se tropiece, como herramientas o piedras.  Limpie regularmente las hojas, la nieve o el hielo.  Utilice arena o sal en los senderos durante el invierno.  Limpie de inmediato los derrames en el garaje. Esto incluye los derrames de [place]. Qu otras medidas puedo tomar?  Use calzado con estas caractersticas: ? Que tenga taco bajo. No use zapatos con tacones altos. ? Que tenga suela de goma. ? Que le [employer] y con comodidad. ? Que sean cerrados. No use sandalias con punta abierta.  Use recursos que lo ayuden a desplazarse, si son necesarios. Estos incluyen: ? Bastones. ? Andadores. ? Patinetes con soporte para el pie. ? Muletas.  Revise los medicamentos con el mdico. Algunos medicamentos pueden hacer que se sienta mareado. Esto puede aumentar el riesgo de caerse. Pregntele al mdico qu otras cosas puede hacer para ayudar a prevenir las cadas. Dnde buscar ms informacin  Centers for Disease Control and Prevention (Centros para el Control y la Prevencin de [place]), STEADI: FootballExhibition.com.br  National Institute on Aging (Instituto Nacional sobre el Envejecimiento): https://walker.com/ Comunquese con un mdico si:  Tiene miedo de caerse en su casa.  Se siente dbil, somnoliento o mareado en su casa.  Se cae en su casa. Resumen  Hay muchas cosas simples que puede hacer para que su casa sea un lugar seguro y ayudar a prevenir las cadas.  Algunas formas de garantizar la seguridad en su casa incluyen eliminar cosas con las que pueda tropezar e instalar barras para sostn en el bao.  Pida ayuda cuando haga estos cambios en su hogar. Esta informacin no tiene Theme park manager el consejo del mdico. Asegrese de hacerle al mdico cualquier pregunta que tenga. Document Revised: 08/22/2020 Document Reviewed: 08/22/2020 Elsevier Patient Education  2021 Elsevier Inc.  General Headache Without Cause A headache is pain or discomfort that is felt around the head  or neck area. There are many causes and types of headaches. In some cases, the cause may not be found. Follow these instructions at home: Watch your condition for any changes. Let your doctor know about them. Take these steps to help with your condition: Managing pain  Take over-the-counter and prescription medicines only as told by your doctor.  Lie down in a dark, quiet room when you have a headache.  If told, put ice on your head and neck area: ? Put ice in a plastic bag. ? Place a towel between your skin and the bag. ? Leave the ice on for 20 minutes, 2-3 times per day.  If told, put heat on the affected area. Use the heat source that your doctor recommends, such as a moist heat pack or a heating pad. ? Place a towel between your skin and the heat source. ? Leave the heat on for 20-30 minutes. ? Remove the heat if your skin turns bright red. This is very important if you are unable to feel pain,  heat, or cold. You may have a greater risk of getting burned.  Keep lights dim if bright lights bother you or make your headaches worse.      Eating and drinking  Eat meals on a regular schedule.  If you drink alcohol: ? Limit how much you use to:  0-1 drink a day for women.  0-2 drinks a day for men. ? Be aware of how much alcohol is in your drink. In the [place] one drink equals one 12 oz bottle of beer (355 mL), one 5 oz glass of wine (148 mL), or one 1 oz glass of hard liquor (44 mL).  Stop drinking caffeine, or reduce how much caffeine you drink. General instructions  Keep a journal to find out if certain things bring on headaches. For example, write down: ? What you eat and drink. ? How much sleep you get. ? Any change to your diet or medicines.  Get a massage or try other ways to relax.  Limit stress.  Sit up straight. Do not tighten (tense) your muscles.  Do not use any products that contain nicotine or tobacco. This includes cigarettes, e-cigarettes, and chewing  tobacco. If you need help quitting, ask your doctor.  Exercise regularly as told by your doctor.  Get enough sleep. This often means 7-9 hours of sleep each night.  Keep all follow-up visits as told by your doctor. This is important.   Contact a doctor if:  Your symptoms are not helped by medicine.  You have a headache that feels different than the other headaches.  You feel sick to your stomach (nauseous) or you throw up (vomit).  You have a fever. Get help right away if:  Your headache gets very bad quickly.  Your headache gets worse after a lot of physical activity.  You keep throwing up.  You have a stiff neck.  You have trouble seeing.  You have trouble speaking.  You have pain in the eye or ear.  Your muscles are weak or you lose muscle control.  You lose your balance or have trouble walking.  You feel like you will pass out (faint) or you pass out.  You are mixed up (confused).  You have a seizure. Summary  A headache is pain or discomfort that is felt around the head or neck area.  There are many causes and types of headaches. In some cases, the cause may not be found.  Keep a journal to help find out what causes your headaches. Watch your condition for any changes. Let your doctor know about them.  Contact a doctor if you have a headache that is different from usual, or if your headache is not helped by medicine.  Get help right away if your headache gets very bad, you throw up, you have trouble seeing, you lose your balance, or you have a seizure. This information is not intended to replace advice given to you by your health care provider. Make sure you discuss any questions you have with your health care provider. Document Revised: 06/23/2018 Document Reviewed: 06/23/2018 Elsevier Patient Education  2021 [employer].

## 2021-04-20 ENCOUNTER — Ambulatory Visit (INDEPENDENT_AMBULATORY_CARE_PROVIDER_SITE_OTHER): Payer: Self-pay | Admitting: Family Medicine

## 2021-04-20 ENCOUNTER — Other Ambulatory Visit: Payer: Self-pay

## 2021-04-20 VITALS — BP 100/60 | HR 98 | Wt 184.4 lb

## 2021-04-20 DIAGNOSIS — O99612 Diseases of the digestive system complicating pregnancy, second trimester: Secondary | ICD-10-CM

## 2021-04-20 DIAGNOSIS — Z131 Encounter for screening for diabetes mellitus: Secondary | ICD-10-CM

## 2021-04-20 DIAGNOSIS — R1013 Epigastric pain: Secondary | ICD-10-CM

## 2021-04-20 DIAGNOSIS — K219 Gastro-esophageal reflux disease without esophagitis: Secondary | ICD-10-CM | POA: Insufficient documentation

## 2021-04-20 DIAGNOSIS — Z3492 Encounter for supervision of normal pregnancy, unspecified, second trimester: Secondary | ICD-10-CM

## 2021-04-20 MED ORDER — VITAMIN B-6 50 MG PO TABS
50.0000 mg | ORAL_TABLET | Freq: Every day | ORAL | 0 refills | Status: DC
  Filled 2021-04-20: qty 30, 30d supply, fill #0

## 2021-04-20 MED ORDER — PANTOPRAZOLE SODIUM 40 MG PO TBEC
40.0000 mg | DELAYED_RELEASE_TABLET | Freq: Every day | ORAL | 2 refills | Status: DC
  Filled 2021-04-20: qty 30, 30d supply, fill #0

## 2021-04-20 NOTE — Patient Instructions (Addendum)
Fue maravilloso verte hoy.  Por favor traiga TODOS sus medicamentos a cada visita.  Hoy hablamos de:  -Su prxima cita ser el 6/2 a las 10:45 AM. -Le dejar saber los resultados de la prueba de diabetes. -Por favor vaya a la MAU si no siente que su beb se mueve, tiene prdida de sangre o lquido. -He surtido recetas de Protonix (para el reflujo) y vitamina B6 (para ayudar con las nuseas)  Equities trader por elegir Medicina familiar de [place].  Llame al [phone] si tiene alguna pregunta sobre la cita de [place].  Asegrese de programar un seguimiento en la recepcin antes de irse hoy.  Sabino Dick, D.O. PGY-1 Medicina Familiar   It was wonderful to see you today.  Please bring ALL of your medications with you to every visit.   Today we talked about:  -Your next appointment will be on 6/2 at 10:45AM.  -I will let you know the results of the diabetes screen. -Please go to the MAU if you don't feel your baby move, have leakage of blood or fluid. -I have filled prescriptions for Protonix (for reflux) and Vitamin B6 (to help with your nausea)   Thank you for choosing [facility].   Please call [phone] with any questions about today's appointment.  Please be sure to schedule follow up at the front  desk before you leave today.   Sabino Dick, DO PGY-1 Family Medicine    Segundo trimestre de Summer Set Second Trimester of Pregnancy  El segundo trimestre de embarazo va desde la semana 13 hasta la semana 27. Es Designer, jewellery desde el mes 4 hasta el mes 6 de Kathleen. El segundo trimestre suele ser el momento en el que mejor se siente. Su organismo se ha adaptado a Charity fundraiser, y comienza a Diplomatic Services operational officer. Durante el segundo trimestre:  Las nuseas del embarazo han disminuido o han desaparecido completamente.  Usted puede tener ms energa.  Es posible que tenga un aumento del apetito. El segundo trimestre es tambin un perodo en  el que el beb en gestacin (feto) crece rpidamente. Hacia el final del sexto mes, el feto puede medir aproximadamente 12 pulgadas y pesar alrededor de 1 libras. Es probable que sienta que el beb se Teacher, English as a foreign language (da pataditas) entre las 16 y 20semanas del Psychiatrist. Cambios en el cuerpo durante el segundo trimestre Su cuerpo continua experimentando numerosos cambios durante su segundo trimestre. Los cambios varan y generalmente vuelven a la normalidad despus del nacimiento del beb. Cambios fsicos  Seguir American Standard Companies. Notar que la parte baja del abdomen sobresale.  Podrn aparecer las primeras Albertson's caderas, el abdomen y las [place].  Las Con[employer]ra Foods seguirn creciendo y se tornarn sensibles.  Pueden aparecer zonas oscuras o manchas (cloasma o mscara del embarazo) en el rostro.  Es posible que se forme una lnea oscura desde el ombligo hasta la zona del pubis (linea nigra).  Tal vez haya cambios en el cabello. Esto cambios pueden incluir su engrosamiento, crecimiento rpido y [employer] textura. A algunas personas tambin se les cae el cabello durante o despus del [place], o tienen el cabello seco o fino. Cambios en la salud  Comienza a tener dolores de [place].  Es posible que tenga acidez estomacal.  Puede tener estreimiento.  Pueden aparecer hemorroides o abultarse e hincharse las venas (venas varicosas).  Las encas pueden sangrar y estar sensibles al cepillado y al hilo dental.  Nicanor Bake vez tenga necesidad de orinar con ms frecuencia  porque el feto est ejerciendo presin sobre la vejiga.  Puede sentir dolor en la espalda. Esto se debe a: ? Aumento de peso. ? Las hormonas del Management consultant las articulaciones en la pelvis. ? Un cambio en el peso y los msculos que ayudan a Pharmacologist su equilibrio. Siga estas instrucciones en su casa: Medicamentos  Siga las instrucciones del mdico en relacin con el uso de medicamentos. Durante el embarazo, hay medicamentos que  pueden tomarse y otros que no. No tome medicamentos a menos que lo haya autorizado el mdico.  Tome vitaminas prenatales que contengan por lo menos (mcg) de cido flico. Comida y bebida  Lleve una dieta saludable que incluya frutas y verduras frescas, cereales integrales, buenas fuentes de protenas como carnes [place], huevos o tofu, y productos lcteos descremados.  Evite la carne cruda y el [place], la [place] y el queso sin Market researcher. Estos portan grmenes que pueden provocar dao tanto a usted como al beb.  Es posible que tenga que tomar estas medidas para prevenir o tratar el estreimiento: ? Product manager suficiente lquido como para Pharmacologist la orina de color amarillo plido. ? Consumir alimentos ricos en fibra, como frijoles, cereales integrales, y frutas y verduras frescas. ? Limitar el consumo de alimentos ricos en grasa y azcares procesados, como los alimentos fritos o dulces. Actividad  Haga ejercicio solamente como se lo haya indicado el mdico. La mayora de las personas pueden continuar su rutina de ejercicios durante el [place]. Intente realizar como mnimo de actividad fsica por lo menos 5das a la semana. Deje de hacer ejercicio si comienza a Teacher, music.  Deje de hacer ejercicio si le aparecen dolor o clicos en la parte baja del vientre o de la espalda.  Evite hacer ejercicio si hace mucho calor o humedad, o si se encuentra a una altitud elevada.  Evite levantar pesos Fortune Brands.  Si lo desea, puede seguir teniendo [employer], salvo que el mdico le indique lo contrario. Alivio del dolor y del Dentist  Use un sujetador que le brinde buen soporte para prevenir las molestias causadas por la sensibilidad en las [place].  Dese baos de asiento con agua tibia para Engineer, materials o las molestias causadas por las hemorroides. Use una crema para las hemorroides si el mdico la autoriza.  Descanse con las piernas levantadas  (elevadas) si tiene calambres en las piernas o dolor en la parte baja de la espalda.  Si tiene venas varicosas: ? Use medias de compresin como se lo haya indicado el mdico. ? Eleve los pies durante , 3 o 4veces por da. ? Limite el consumo de sal en su dieta. Seguridad  Use el cinturn de seguridad en todo momento mientras conduce o va en auto.  Hable con el mdico si es vctima de [employer] o fsico. Estilo de vida  No se d baos de inmersin en agua caliente, baos turcos ni saunas.  No se haga duchas vaginales. No use tampones ni toallas higinicas perfumadas.  Evite el contacto con las bandejas sanitarias de los gatos y la tierra que estos animales usan. Estos elementos contienen bacterias que pueden causar defectos congnitos al beb y la posible prdida del feto debido a un aborto espontneo o muerte fetal.  No use remedios a base de hierbas, alcohol, drogas ilegales ni medicamentos que no estn aprobados por el mdico. Las sustancias qumicas de estos productos pueden daar al beb.  No consuma ningn producto que contenga nicotina o tabaco, como  cigarrillos, cigarrillos electrnicos y tabaco de Theatre manager. Si necesita ayuda para dejar de fumar, consulte al mdico. Instrucciones generales  Durante una visita prenatal de rutina, el [employer] har un examen fsico y Probation officer. Tambin le hablar sobre su salud general. Cumpla con todas las visitas de seguimiento. Esto es importante.  Pdale al mdico que la derive a clases de educacin prenatal en su localidad.  Pida ayuda si tiene necesidades nutricionales o de asesoramiento Academic librarian. El mdico puede aconsejarla o derivarla a especialistas para que la ayuden con diferentes necesidades. Dnde buscar ms informacin  American Pregnancy Association (Asociacin Americana del [name]): americanpregnancy.org  [employer] Corporation of Obstetricians and Gynecologists (Colegio Estadounidense de Obstetras y  [place]): EmploymentAssurance.cz?  Office on Pitney Bowes (Oficina para la Salud de la Mujer): MightyReward.co.nz Comunquese con un mdico si tiene:  Un dolor de cabeza que no desaparece despus de Science writer.  Cambios en la visin o ve manchas delante de los ojos.  Clicos leves, presin en la pelvis o dolor persistente en el abdomen.  Nuseas persistentes, vmitos o diarrea.  Secrecin vaginal con mal olor u orina con Charles [employer]ab ftido.  Dolor al Beatrix Shipper.  Hinchazn sbita o extrema del rostro, las [address], los tobillos, los pies o las piernas.  Grant Ruts. Busque ayuda de inmediato si:  Tiene una prdida de lquido por la vagina.  Tiene sangrado ligero o manchas vaginales.  Tiene dolor intenso o clicos en el abdomen.  Presenta dificultad para respirar.  Siente dolor en el pecho.  Tiene episodios de Baxter International.  No ha sentido a su beb moverse durante el perodo de [employer]pra Energy indic el mdico.  Tiene dolor, hinchazn o enrojecimiento nuevos o ms intensos en un brazo o una pierna. Resumen  El segundo trimestre de embarazo va desde la semana 13 hasta la 27 (desde el mes 4 hasta el 6).  No use remedios a base de hierbas, alcohol, drogas ilegales ni medicamentos que no estn aprobados por el mdico. Las sustancias qumicas de estos productos pueden daar al beb.  Haga ejercicio solamente como se lo haya indicado el mdico. La mayora de las personas pueden continuar su rutina de ejercicios durante el [place].  Cumpla con todas las visitas de seguimiento. Esto es importante. Esta informacin no tiene Theme park manager el consejo del mdico. Asegrese de hacerle al mdico cualquier pregunta que tenga. Document Revised: 06/13/2020 Document Reviewed: 06/13/2020 Elsevier Patient Education  2021 [employer].

## 2021-04-20 NOTE — Progress Notes (Signed)
  [facility] Prenatal Visit  Amy Lloyd is a 35 y.o. [id number] at [redacted]w[redacted]d here for routine follow up. She is dated by early ultrasound.  She reports heartburn, nausea, vomiting and body feeling heavy, occasional palpitations.  She reports good fetal movement. No bleeding, loss of fluid, contractions. See flow sheet for details. Vitals:   04/20/21 1502  BP: 100/60  Pulse: 98    A/P: Pregnancy at [redacted]w[redacted]d.  Doing well.   . Dating reviewed, dating tab is correct . Fetal heart tones Appropriate . Fundal height within expected range.  . States that anatomy ultrasound performed but unable to review at this time- will request report.  . Influenza vaccine not administered as not influenza season. .  . COVID vaccination was discussed. Patient reports two previous vaccines though unable to confirm and patient does not have COVID card with her. Follow up and offer COVID vaccine/booster if needed at next visit.  . Indications for screening for preexisting diabetes include: BMI > 25 and high risk ethnicity (Latino, [place] American, Native American, [place] Islander, Asian Naval architect) .  Marland Kitchen Pregnancy education provided on the following topics: fetal growth and movement, ultrasound assessment, and upcoming laboratory assessment.   . Unable to schedule patient in [facility] during second trimester given late presentation for initial prenatal care, she is scheduled for OB clinic at 29 weeks (5 weeks from now). . Preterm labor precautions given.   2. Pregnancy issues include the following and were addressed as appropriate today:  . GERD: Rx Protonix         Nausea: Vitamin B6 prescription resent as patient states difficulties with this last time; discussed calling clinic if any further issues with medications         Feelings of being heavy/palpitations could be related to normal pregnancy or potential orthostasis as they resolve when she stands. Encourage oral hydration.          F/u on COVID vaccines and if patient is eligible for booster. States she will bring her vaccine card to follow up appointment  1-hr glucola performed today.  . Problem list and pregnancy box updated: Yes.     Follow up 4 weeks.

## 2021-04-21 ENCOUNTER — Other Ambulatory Visit: Payer: Self-pay | Admitting: Family Medicine

## 2021-04-21 DIAGNOSIS — R739 Hyperglycemia, unspecified: Secondary | ICD-10-CM

## 2021-04-21 LAB — GLUCOSE TOLERANCE, 1 HOUR: Glucose, 1Hr PP: 137 mg/dL (ref 65–199)

## 2021-04-21 NOTE — Progress Notes (Signed)
Attempted to call patient using Spanish interpreter, [place] #[phone], regarding her failed 1 hour GTT.  Unfortunately, was unable to reach patient after 2 attempts (called mobile number listed in epic).  There was an additional number listed as a home number, this was also attempted but the voicemail appears to be a female with a different name.  Voicemail left on mobile phone number stating that I will call back again at another time.  3-hour GTT order placed at this time.

## 2021-04-24 ENCOUNTER — Encounter: Payer: Self-pay | Admitting: Family Medicine

## 2021-04-24 NOTE — Progress Notes (Signed)
I have tried to contact patient 4x, unsuccessfully (twice on 5/6 and 5/9). Letter mailed to patient with results and with instructions to return to lab for 3-hour GTT.

## 2021-04-27 ENCOUNTER — Other Ambulatory Visit: Payer: Self-pay

## 2021-04-28 ENCOUNTER — Other Ambulatory Visit: Payer: Self-pay

## 2021-04-28 ENCOUNTER — Other Ambulatory Visit: Payer: Self-pay | Admitting: Family Medicine

## 2021-04-28 DIAGNOSIS — Z3482 Encounter for supervision of other normal pregnancy, second trimester: Secondary | ICD-10-CM

## 2021-04-28 DIAGNOSIS — R739 Hyperglycemia, unspecified: Secondary | ICD-10-CM

## 2021-04-28 MED ORDER — ONDANSETRON HCL 4 MG PO TABS
4.0000 mg | ORAL_TABLET | Freq: Three times a day (TID) | ORAL | 0 refills | Status: DC | PRN
  Filled 2021-04-28: qty 3, 1d supply, fill #0

## 2021-04-28 NOTE — Progress Notes (Signed)
Zofran prescribed for patient as she was unable to tolerate 3-hour GTT today. To return for repeat testing and has been instructed to take Zofran 1-hour prior to arrival/testing.

## 2021-04-28 NOTE — Progress Notes (Signed)
Patient became sick after drinking 100 grams of glucola for 3 hr gtt. Test cancelled per protocol. Doris Cheadle Madgie Dhaliwal

## 2021-05-04 ENCOUNTER — Other Ambulatory Visit: Payer: Self-pay

## 2021-05-04 ENCOUNTER — Other Ambulatory Visit: Payer: Self-pay | Admitting: Family Medicine

## 2021-05-04 ENCOUNTER — Other Ambulatory Visit (INDEPENDENT_AMBULATORY_CARE_PROVIDER_SITE_OTHER): Payer: Self-pay

## 2021-05-04 DIAGNOSIS — Z3A26 26 weeks gestation of pregnancy: Secondary | ICD-10-CM

## 2021-05-04 DIAGNOSIS — Z3482 Encounter for supervision of other normal pregnancy, second trimester: Secondary | ICD-10-CM

## 2021-05-04 LAB — POCT CBG (FASTING - GLUCOSE)-MANUAL ENTRY: Glucose Fasting, POC: 93 mg/dL (ref 70–99)

## 2021-05-04 MED ORDER — ONDANSETRON 4 MG PO TBDP
4.0000 mg | ORAL_TABLET | Freq: Three times a day (TID) | ORAL | 0 refills | Status: DC | PRN
  Filled 2021-05-04: qty 1, 1d supply, fill #0

## 2021-05-04 NOTE — Progress Notes (Signed)
1 tab of 4 mg Zofran ODT sent to pharmacy for upcoming 2 hr GTT.

## 2021-05-04 NOTE — Progress Notes (Signed)
Patient became sick about 45 min after ingesting 75 grams of glucola for 2 hr gtt. Test cancelled per protocol, MD notified. Patient will return on Tuesday of next week to repeat test. She was instructed to take 4 mg zofran one hour prior to her appointment and will receive a second dose prior to starting test. Jennette Bill

## 2021-05-04 NOTE — Addendum Note (Signed)
Addended by: Jennette Bill on: 05/04/2021 10:00 AM   Modules accepted: Orders

## 2021-05-05 ENCOUNTER — Other Ambulatory Visit: Payer: Self-pay

## 2021-05-09 ENCOUNTER — Telehealth: Payer: Self-pay | Admitting: Family Medicine

## 2021-05-09 ENCOUNTER — Other Ambulatory Visit: Payer: Self-pay

## 2021-05-09 NOTE — Telephone Encounter (Signed)
Advised by Tessie Fass from the [facility] lab that patient had no showed for her third attempt at 3-hr GTT. Unfortunately she has had two prior unsuccessful attempts at this 3-hr GTT even with Zofran administered. Patient has follow up with me next Thursday, 6/2.  Discussed this situation with preceptor, Dr. McDiarmid. Can attempt 2-hr GTT with Phenergan instead of Zofran this time. Attempted to contact patient with spanish interpreter (simon [phone]). Unfortunately, unable to reach patient x 2 attempts and voicemail full so unable to leave message.   Will attempt again at a later time.

## 2021-05-10 ENCOUNTER — Other Ambulatory Visit: Payer: Self-pay | Admitting: Family Medicine

## 2021-05-10 DIAGNOSIS — R739 Hyperglycemia, unspecified: Secondary | ICD-10-CM

## 2021-05-10 NOTE — Progress Notes (Signed)
Unable to reach patient again regarding repeating GTT.  Used Development worker, international aid [phone].  Interpreter was able to leave a voicemail on patient's phone indicating the importance of repeating this glucose tolerance test.  Was able to discuss that we will try for a 2-hour test instead of a 3-hour test and will premedicate with a different antiemetic (Phenergan).  Recommended that patient come into the lab prior to her appointment with me next Thursday.  Informed her to arrive around 8 45-9 AM.  Gave her the number to the clinic to call if she has any further questions or concerns.  Future order placed for 2-hour GTT.  Patient will require premedication with Phenergan.  Can be clinic administered.

## 2021-05-18 ENCOUNTER — Ambulatory Visit (INDEPENDENT_AMBULATORY_CARE_PROVIDER_SITE_OTHER): Payer: Self-pay | Admitting: Family Medicine

## 2021-05-18 ENCOUNTER — Encounter: Payer: Self-pay | Admitting: Family Medicine

## 2021-05-18 ENCOUNTER — Other Ambulatory Visit: Payer: Self-pay

## 2021-05-18 VITALS — BP 116/78 | HR 95 | Wt 189.0 lb

## 2021-05-18 DIAGNOSIS — O219 Vomiting of pregnancy, unspecified: Secondary | ICD-10-CM

## 2021-05-18 DIAGNOSIS — N949 Unspecified condition associated with female genital organs and menstrual cycle: Secondary | ICD-10-CM | POA: Insufficient documentation

## 2021-05-18 DIAGNOSIS — Z3482 Encounter for supervision of other normal pregnancy, second trimester: Secondary | ICD-10-CM

## 2021-05-18 DIAGNOSIS — Z3A28 28 weeks gestation of pregnancy: Secondary | ICD-10-CM

## 2021-05-18 DIAGNOSIS — R739 Hyperglycemia, unspecified: Secondary | ICD-10-CM

## 2021-05-18 DIAGNOSIS — Z3480 Encounter for supervision of other normal pregnancy, unspecified trimester: Secondary | ICD-10-CM

## 2021-05-18 DIAGNOSIS — O321XX0 Maternal care for breech presentation, not applicable or unspecified: Secondary | ICD-10-CM

## 2021-05-18 LAB — POCT CBG (FASTING - GLUCOSE)-MANUAL ENTRY: Glucose Fasting, POC: 86 mg/dL (ref 70–99)

## 2021-05-18 MED ORDER — ONDANSETRON 4 MG PO TBDP
4.0000 mg | ORAL_TABLET | Freq: Once | ORAL | Status: AC
  Administered 2021-05-18: 4 mg via ORAL

## 2021-05-18 NOTE — Patient Instructions (Addendum)
Fue maravilloso verte hoy.  Por favor traiga TODOS sus medicamentos a cada visita.  Hoy hablamos de:  Hoy estamos revisando anlisis de [place]. Te dejar saber los resultados de eso.   Te di [employer] QR para las clases de parto que se ofrecen en [place].  Su ultrasonido est programado para el 23 de junio a las 10:00.  Tambin te entregu una carta para que te pongas la vacuna TDap en el departamento de salud.  Si experimenta sangrado vaginal, prdida de lquido o no siente que su beb se mueva, vaya directamente a la Patent attorney de [address] de [facility].   Karl Pock por elegir Medicina familiar de [place].  Llame al [phone] si tiene alguna pregunta sobre la cita de [place].  Asegrese de programar un seguimiento en la recepcin antes de irse hoy.  Sabino Dick, D.O. PGY-1 Medicina Familiar   It was wonderful to see you today.  Please bring ALL of your medications with you to every visit.   Today we talked about:  Today we are checking blood work. I will let you know the results of that.    I gave you the QR Code for childbirth classes offered at [facility].  Your ultrasound is scheduled for June 23rd at 10:00.  I also provided you with a letter to get your TDap vaccine at the health department.  If you experience vaginal bleeding, leakage of fluid or don't feel your baby moving around please go directly to the Maternity Assessment Unit at [facility].    Thank you for choosing [facility].   Please call [phone] with any questions about today's appointment.  Please be sure to schedule follow up at the front  desk before you leave today.   Sabino Dick, DO PGY-1 Family Medicine    [email or web address]">  Tercer trimestre de Psychiatrist Third Trimester of Pregnancy  El tercer trimestre de embarazo va desde la semana 28 hasta la semana 40. Esto  corresponde a los meses 7 a 9. El tercer trimestre es un perodo en el que el beb en gestacin (feto) crece rpidamente. Hacia el final del noveno mes, el feto mide alrededor de 20pulgadas (45cm) de largo y pesa entre 6 y 10 libras (2.7 y 4.5kg). Cambios en el cuerpo durante el tercer trimestre Durante el tercer trimestre, su cuerpo contina experimentando numerosos cambios. Los cambios varan y generalmente vuelven a la normalidad despus del nacimiento del beb. Cambios fsicos  Seguir American Standard Companies. Es de esperar que aumente entre 25 y 35libras (11 y 16kg) Psychiatric nurse final del embarazo si inicia el Psychiatrist con un peso normal. Si tiene bajo [place], es de esperar que aumente entre 28 y 40 libras (13 y18 kg), y si tiene sobrepeso, es de esperar que aumente entre 15 y 25 libras (7 y 11kg).  Podrn aparecer las primeras Albertson's caderas, el abdomen y las Gratis.  Las ConAgra Foods seguirn creciendo y Writer. Un lquido amarillo Charity fundraiser) puede salir de sus pechos. Esta es la primera leche que usted produce para su beb.  Tal vez haya cambios en el cabello. Esto cambios pueden incluir su engrosamiento, crecimiento rpido y Allied Waste Industries textura. A algunas personas tambin se les cae el cabello durante o despus del [place], o tienen el cabello seco o fino.  El ombligo puede salir hacia afuera.  Puede observar que se le Eli Lilly and Company, el rostro o los tobillos. Cambios en la salud  Es  posible que tenga acidez estomacal.  Puede sufrir estreimiento.  Puede desarrollar hemorroides.  Puede desarrollar venas hinchadas y abultadas (venas varicosas) en las piernas.  Puede presentar ms dolor en la pelvis, la espalda o los muslos. Esto se debe al Citigroup de peso y al aumento de las hormonas que relajan las articulaciones.  Puede presentar un aumento del hormigueo o entumecimiento en las manos, brazos y piernas. La piel de su abdomen tambin puede sentirse entumecida.  Puede  sentir que le falta el aire debido a que se expande el tero. Otros cambios  Puede tener necesidad de Geographical information systems officer con ms frecuencia porque el feto baja hacia la pelvis y ejerce presin sobre la vejiga.  Puede tener ms problemas para dormir. Esto puede deberse al tamao de su abdomen, una mayor necesidad de orinar y un aumento en el metabolismo de su cuerpo.  Puede notar que el feto "baja" o lo siente ms bajo, en el abdomen (aligeramiento).  Puede tener un aumento de la secrecin vaginal.  Puede notar que tiene dolor alrededor del hueso plvico a medida que el tero se distiende. Siga estas instrucciones en su casa: Medicamentos  Siga las instrucciones del mdico en relacin con el uso de medicamentos. Durante el embarazo, hay medicamentos que pueden tomarse y otros que no. No tome ningn medicamento a menos que lo haya autorizado el mdico.  Tome vitaminas prenatales que contengan por lo menos (mcg) de cido flico. Comida y bebida  Lleve una dieta saludable que incluya frutas y verduras frescas, cereales integrales, buenas fuentes de protenas como carnes [place], huevos o tofu, y productos lcteos descremados.  Evite la carne cruda y el [place], la [place] y el queso sin Market researcher. Estos portan grmenes que pueden provocar dao tanto a usted como al beb.  Tome 4 o 5 comidas pequeas en lugar de 3 comidas abundantes al da.  Es posible que tenga que tomar estas medidas para prevenir o tratar el estreimiento: ? Product manager suficiente lquido como para Pharmacologist la orina de color amarillo plido. ? Consumir alimentos ricos en fibra, como frijoles, cereales integrales, y frutas y verduras frescas. ? Limitar el consumo de alimentos ricos en grasa y azcares procesados, como los alimentos fritos o dulces. Actividad  Haga ejercicio solamente como se lo haya indicado el mdico. La mayora de las personas pueden continuar su actividad fsica habitual durante el [place]. Intente  realizar como mnimo de actividad fsica por lo menos 5das a la semana. Deje de hacer ejercicio si experimenta contracciones en el tero.  Deje de hacer ejercicio si le aparecen dolor o clicos en la parte baja del vientre o de la espalda.  Evite levantar pesos Fortune Brands.  No haga ejercicio si hace mucho calor o humedad, o si se encuentra a una altitud elevada.  Si lo desea, puede seguir teniendo [employer] [place] [employer]ers, salvo que el mdico le indique lo contrario. Alivio del dolor y del [place] p[place]as frecuentes y descanse con las piernas levantadas (elevadas) si tiene calambres en las piernas o dolor en la parte baja de la espalda.  Dese baos de asiento con agua tibia para Engineer, materials o las molestias causadas por las hemorroides. Use una crema para las hemorroides si el mdico la autoriza.  Use un sujetador que le brinde buen soporte para prevenir las molestias causadas por la sensibilidad en las [place].  Si tiene venas varicosas: ? Use medias de compresin como se lo haya indicado el mdico. ? Eleve los pies durante ,  3 o 4veces por da. ? Limite el consumo de sal en su dieta. Seguridad  Hable con su mdico antes de viajar distancias largas.  No se d baos de inmersin en agua caliente, baos turcos ni saunas.  Use el cinturn de seguridad en todo momento mientras conduce o va en auto.  Hable con el mdico si es vctima de Genuine Partsmaltrato verbal o fsico. Preparacin para el nacimiento Para prepararse para la llegada de su beb:  Tome clases prenatales para entender, Education administratorpracticar, y hacer preguntas sobre el [place] Be[place]rabajo de parto y Applingel parto.  Visite el hospital y recorra el rea de maternidad.  Compre un asiento de seguridad FirstEnergy Corporientado hacia atrs, y asegrese de saber cmo instalarlo en su automvil.  Prepare la habitacin o el lugar donde dormir el beb. Asegrese de quitar todas las almohadas y Remyanimales de peluche de la cuna del beb para evitar la  asfixia. Indicaciones generales  Evite el contacto con las bandejas sanitarias de los gatos y la tierra que estos animales usan. Estos alimentos contienen bacterias que pueden causar defectos congnitos en el beb. Si tiene Financial controllerun gato, pdale a alguien que limpie la caja de arena por usted.  No se haga lavados vaginales ni use tampones. No use toallas higinicas perfumadas.  No consuma ningn producto que contenga nicotina o tabaco, como cigarrillos, cigarrillos electrnicos y tabaco de Theatre managermascar. Si necesita ayuda para dejar de consumir estos productos, consulte al mdico.  No use ningn remedio a base de hierbas, drogas ilegales o medicamentos que no le hayan sido recetados. Las sustancias qumicas de estos productos pueden daar al beb.  No beba alcohol.  Le realizarn exmenes prenatales ms frecuentes durante el tercer trimestre. Durante una visita prenatal de rutina, el mdico le har un examen fsico, Civil engineer, contractingle realizar pruebas y Heritage managerhablar con usted de su salud general. Cumpla con todas las visitas de seguimiento. Esto es importante. Dnde buscar ms informacin  American Pregnancy Association (Asociacin Estadounidense del [name]): americanpregnancy.org  Celanese Corporationmerican College of Obstetricians and Gynecologists (Colegio Estadounidense de Obstetras y ArpGineclogos): EmploymentAssurance.czacog.org/womens-health/pregnancy?  Office on Pitney BowesWomen's Health (Oficina para la Salud de la Mujer): [email or web address] Comunquese con un mdico si tiene:  Teacher, English as a foreign languageiebre.  Clicos leves en la pelvis, presin en la pelvis o dolor persistente en la zona abdominal o la parte baja de la espalda.  Vmitos o diarrea.  Secrecin vaginal con mal olor u orina con mal olor.  Dolor al Beatrix Shipperorinar.  Un dolor de cabeza que no desaparece despus de Science writertomar analgsicos.  Cambios en la visin o ve manchas delante de los ojos. Solicite ayuda de inmediato si:  Rompe la bolsa.  Tiene contracciones regulares separadas por menos de 5minutos.  Tiene  sangrado o pequeas prdidas vaginales.  Siente un dolor abdominal intenso.  Tiene dificultad para respirar.  Siente dolor en el pecho.  Sufre episodios de Baxter Internationaldesmayo.  No ha sentido a su beb moverse durante el perodo de Sempra Energytiempo que le indic el mdico.  Tiene dolor, hinchazn o enrojecimiento nuevos en un brazo o una pierna o se produce un aumento de alguno de estos sntomas. Resumen  El tercer trimestre del Psychiatristembarazo comprende desde la semana28 hasta la semana 40 (desde el mes7 hasta el mes9).  Puede tener ms problemas para dormir. Esto puede deberse al tamao de su abdomen, una mayor necesidad de orinar y un aumento en el metabolismo de su cuerpo.  Le realizarn exmenes prenatales ms frecuentes durante el tercer trimestre. Cumpla con todas las visitas de seguimiento. Esto es importante.  Esta informacin no tiene Theme park manager el consejo del mdico. Asegrese de hacerle al mdico cualquier pregunta que tenga. Document Revised: 06/10/2020 Document Reviewed: 06/10/2020 Elsevier Patient Education  2021 ArvinMeritor.

## 2021-05-18 NOTE — Progress Notes (Signed)
  [facility] Prenatal Visit  Amy Lloyd is a 35 y.o. [phone] at [redacted]w[redacted]d here for routine follow up. She is dated by early ultrasound.  She reports Intermittent abdominal pains, left-sided. Worse with cough and certain movements. . She reports fetal movement. She denies vaginal bleeding, contractions, or loss of fluid. See flow sheet for details.  Vitals:   05/18/21 1037  BP: 116/78  Pulse: 95    A/P: Pregnancy at [redacted]w[redacted]d.  Doing well.   1. Routine prenatal care:  Marland Kitchen Dating reviewed, dating tab is correct . Fetal heart tones Appropriate . Fundal height >2 cm from expected size given dating, discussed with preceptor. Has follow up ultrasound scheduled.  . Infant feeding choice: Both  . Contraception choice: IUD (wound need to get at HD) . Infant circumcision desired not applicable  . The patient does not have a history of Cesarean delivery and no referral to Center for [facility] is indicated . Influenza vaccine not administered as not influenza season.   . Tdap was not given today. Letter provided to receive at health department . 3 hour glucola, CBC, RPR, and HIV were obtained today.    . Rh status was reviewed and patient does not need Rhogam.  Rhogam was not given today.  . Pregnancy medical home was completed. PHQ-9 form was not done today.   . Childbirth and education classes were offered. QR code provided to link for classes in Spanish.  . Pregnancy education regarding benefits of breastfeeding, contraception, fetal growth, expected weight gain, and safe infant sleep were discussed.  . Preterm labor and fetal movement precautions reviewed.  2. Pregnancy issues include the following and were addressed as appropriate today:  . Abdominal pains consistent with round ligament pain. Normal finding in pregnancy.   She is measuring larger than what would be expected for her GA. She has follow up ultrasound scheduled for later this month. Previous Anatomy  U/S showed female sex, breech position and fetal spine was unable to be visualized 2/2 fetal positioning.   Urine sample collected today. Patient with intermittent dysuria and describes pink-ish colored urine. Will evaluate and treat as necessary. . Problem list and pregnancy box updated: Yes.   Patient scheduled in [facility] during third trimester on 6/9.  Follow up 1 week for faculty visit.

## 2021-05-19 LAB — HIV ANTIBODY (ROUTINE TESTING W REFLEX): HIV Screen 4th Generation wRfx: NONREACTIVE

## 2021-05-19 LAB — CBC WITH DIFFERENTIAL/PLATELET
Basophils Absolute: 0 10*3/uL (ref 0.0–0.2)
Basos: 0 %
EOS (ABSOLUTE): 0.1 10*3/uL (ref 0.0–0.4)
Eos: 1 %
Hematocrit: 33.9 % — ABNORMAL LOW (ref 34.0–46.6)
Hemoglobin: 11.5 g/dL (ref 11.1–15.9)
Immature Grans (Abs): 0.1 10*3/uL (ref 0.0–0.1)
Immature Granulocytes: 1 %
Lymphocytes Absolute: 1.1 10*3/uL (ref 0.7–3.1)
Lymphs: 18 %
MCH: 30.6 pg (ref 26.6–33.0)
MCHC: 33.9 g/dL (ref 31.5–35.7)
MCV: 90 fL (ref 79–97)
Monocytes Absolute: 0.4 10*3/uL (ref 0.1–0.9)
Monocytes: 7 %
Neutrophils Absolute: 4.7 10*3/uL (ref 1.4–7.0)
Neutrophils: 73 %
Platelets: 135 10*3/uL — ABNORMAL LOW (ref 150–450)
RBC: 3.76 x10E6/uL — ABNORMAL LOW (ref 3.77–5.28)
RDW: 13 % (ref 11.7–15.4)
WBC: 6.4 10*3/uL (ref 3.4–10.8)

## 2021-05-19 LAB — GLUCOSE TOLERANCE, 2 HOURS W/ 1HR
Glucose, 1 hour: 135 mg/dL (ref 65–179)
Glucose, 2 hour: 84 mg/dL (ref 65–152)
Glucose, Fasting: 79 mg/dL (ref 65–91)

## 2021-05-19 LAB — RPR: RPR Ser Ql: NONREACTIVE

## 2021-05-20 LAB — URINE CULTURE, OB REFLEX

## 2021-05-20 LAB — CULTURE, OB URINE

## 2021-05-25 ENCOUNTER — Ambulatory Visit (INDEPENDENT_AMBULATORY_CARE_PROVIDER_SITE_OTHER): Payer: Self-pay | Admitting: Family Medicine

## 2021-05-25 ENCOUNTER — Other Ambulatory Visit: Payer: Self-pay

## 2021-05-25 ENCOUNTER — Ambulatory Visit (INDEPENDENT_AMBULATORY_CARE_PROVIDER_SITE_OTHER): Payer: Self-pay

## 2021-05-25 VITALS — BP 110/79 | HR 77 | Wt 188.6 lb

## 2021-05-25 DIAGNOSIS — Z23 Encounter for immunization: Secondary | ICD-10-CM

## 2021-05-25 DIAGNOSIS — Z3A29 29 weeks gestation of pregnancy: Secondary | ICD-10-CM

## 2021-05-25 NOTE — Patient Instructions (Addendum)
It was wonderful to see you today.  Please bring ALL of your medications with you to every visit.   Today we talked about:  Your ultrasound is scheduled for June 23rd at 10AM.   Please go to the health department to get your Tdap vaccine. A letter for this was given to you at our prior visit.  If you experience any vaginal bleeding, leakage of fluids, don't feel your baby moving as much, or start to have contractions less than 5 minutes apart please go directly to the [facility] at [facility]  for evaluation.  Maternity and women's care services located on the [place] side of [facility] (Entrance C off [address]).  [address] Entrance C [place]  [phone]     [facility].   Please call [phone] with any questions about today's appointment.  Please be sure to schedule follow up at the front  desk before you leave today.   Sabino DickAlejandra Ellenor Wisniewski, DO PGY-1 Family Medicine      Fue maravilloso verte hoy.  Por favor traiga TODOS sus medicamentos a cada visita.  Hoy hablamos de:  Su ultrasonido est programado para el 23 de junio a las 10:00.  Vaya al departamento de salud para obtener su vacuna Tdap. Se le entreg una carta para esto en nuestra visita anterior.  Si experimenta sangrado vaginal, prdida de lquidos, no siente que su beb se mueva tanto o comienza a Technical brewertener contracciones con menos de 5 minutos de diferencia, vaya directamente a la Unidad de Evaluacin Materna en [facility] para una evaluacin.  Servicios de atencin de mujeres y maternidad ubicados en el lado sur de [facility] (Entrada C en [address] [place] [phone]     Devona KonigGracias por elegir Medicina familiar de [place].  Llame al [phone] si tiene alguna pregunta sobre la cita de  [place].  Asegrese de programar un seguimiento en la recepcin antes de irse hoy.  Sabino DickAlejandra Sonia Lloyd, D.O. PGY-1 Medicina Familiar    [email or web address]://www.nichd.nih.gov/health/topics/labor-delivery/Pages/default.aspx">  Tercer trimestre de Psychiatristembarazo Third Trimester of Pregnancy  El tercer trimestre de embarazo va desde la semana 28 hasta la semana 40. Esto corresponde a los meses 7 a 9. El tercer trimestre es un perodo en el que el beb en gestacin (feto) crece rpidamente. Hacia el final del noveno mes, el feto mide alrededor de 20 pulgadas (45 cm) de largo y pesa entre 6 y 10 libras (2.7 y 4.5 kg). Cambios en el cuerpo durante el tercer trimestre Durante el tercer trimestre, su cuerpo contina experimentando numerosos cambios. Los cambios varan y generalmente vuelven a la normalidad despus del nacimiento del beb. Cambios fsicos Seguir American Standard Companiesaumentando de peso. Es de esperar que aumente entre 25 y 35 libras (11 y 16 kg) hacia el final del Psychiatristembarazo si inicia el Psychiatristembarazo con un peso normal. Si tiene bajo Lilliepeso, es de esperar que aumente entre 28 y 40 libras (13 y 18 kg), y si tiene sobrepeso, es de esperar que aumente entre 15 y 25 libras (7 y 11 kg). Podrn aparecer las primeras Albertson'sestras en las caderas, el abdomen y las [place]amas. Las ConAgra Foodsmamas seguirn creciendo y Writerpueden doler. Un lquido amarillo Charity fundraiser(calostro) puede salir de sus pechos. Esta es la primera leche que usted produce para su beb. Tal vez haya cambios en el cabello. Esto cambios pueden incluir  su engrosamiento, crecimiento rpido y Allied Waste Industries textura. A algunas personas tambin se les cae el cabello durante o despus del [place], o tienen el cabello seco o fino. El ombligo puede salir hacia afuera. Puede observar que se le Eli Lilly and Company, el rostro o los tobillos. Cambios en la salud Es posible que tenga acidez estomacal. Puede sufrir estreimiento. Puede desarrollar hemorroides. Puede desarrollar venas hinchadas y abultadas (venas varicosas)  en las piernas. Puede presentar ms dolor en la pelvis, la espalda o los muslos. Esto se debe al Citigroup de peso y al aumento de las hormonas que relajan las articulaciones. Puede presentar un aumento del hormigueo o entumecimiento en las manos, brazos y piernas. La piel de su abdomen tambin puede sentirse entumecida. Puede sentir que le falta el aire debido a que se expande el tero. Otros cambios Puede tener necesidad de Geographical information systems officer con ms frecuencia porque el feto baja hacia la pelvis y ejerce presin sobre la vejiga. Puede tener ms problemas para dormir. Esto puede deberse al tamao de su abdomen, una mayor necesidad de orinar y un aumento en el metabolismo de su cuerpo. Puede notar que el feto "baja" o lo siente ms bajo, en el abdomen (aligeramiento). Puede tener un aumento de la secrecin vaginal. Puede notar que tiene dolor alrededor del hueso plvico a medida que el tero se distiende. Siga estas instrucciones en su casa: Medicamentos Siga las instrucciones del mdico en relacin con el uso de medicamentos. Durante el embarazo, hay medicamentos que pueden tomarse y otros que no. No tome ningn medicamento a menos que lo haya autorizado el mdico. Tome vitaminas prenatales que contengan por lo menos 600 microgramos (mcg) de cido flico. Comida y bebida Lleve una dieta saludable que incluya frutas y verduras frescas, cereales integrales, buenas fuentes de protenas como carnes [place], huevos o tofu, y productos lcteos descremados. Evite la carne cruda y el [place], la [place] y el queso sin Market researcher. Estos portan grmenes que pueden provocar dao tanto a usted como al beb. Tome 4 o 5 comidas pequeas en lugar de 3 comidas abundantes al da. Es posible que tenga que tomar estas medidas para prevenir o tratar el estreimiento: Product manager suficiente lquido como para Pharmacologist la orina de color amarillo plido. Consumir alimentos ricos en fibra, como frijoles, cereales integrales, y frutas y  verduras frescas. Limitar el consumo de alimentos ricos en grasa y azcares procesados, como los alimentos fritos o dulces. Actividad Haga ejercicio solamente como se lo haya indicado el mdico. La mayora de las personas pueden continuar su actividad fsica habitual durante el [place]. Intente realizar como mnimo 30 minutos de actividad fsica por lo menos 5 das a la [place]. Deje de hacer ejercicio si experimenta contracciones en el tero. Deje de hacer ejercicio si le aparecen dolor o clicos en la parte baja del vientre o de la espalda. Evite levantar pesos Fortune Brands. No haga ejercicio si hace mucho calor o humedad, o si se encuentra a una altitud elevada. Si lo desea, puede seguir teniendo The St. Paul Travelers, salvo que el mdico le indique lo contrario. Alivio del dolor y del [place] pausas frecuentes y descanse con las piernas levantadas (elevadas) si tiene calambres en las piernas o dolor en la parte baja de la espalda. Dese baos de asiento con agua tibia para Engineer, materials o las molestias causadas por las hemorroides. Use una crema para las hemorroides si el mdico la autoriza. Use un sujetador que le brinde buen soporte para prevenir las Parker Hannifin  por la sensibilidad en las mamas. Si tiene venas varicosas: Use medias de compresin como se lo haya indicado el mdico. Eleve los pies durante 15 minutos, 3 o 4 veces por da. Limite el consumo de sal en su dieta. Seguridad Hable con su mdico antes de viajar distancias largas. No se d baos de inmersin en agua caliente, baos turcos ni saunas. Use el cinturn de seguridad en todo momento mientras conduce o va en auto. Hable con el mdico si es vctima de Genuine Parts o fsico. Preparacin para el nacimiento Para prepararse para la llegada de su beb: Tome clases prenatales para entender, Education administrator, y hacer preguntas sobre el [place] de parto y [place]. Visite el hospital y recorra el rea de maternidad. Compre  un asiento de seguridad FirstEnergy Corp, y asegrese de saber cmo instalarlo en su automvil. Prepare la habitacin o el lugar donde dormir el beb. Asegrese de quitar todas las almohadas y Edison de peluche de la cuna del beb para evitar la asfixia. Indicaciones generales Evite el contacto con las bandejas sanitarias de los gatos y la tierra que estos animales usan. Estos alimentos contienen bacterias que pueden causar defectos congnitos en el beb. Si tiene Financial controller, pdale a alguien que limpie la caja de arena por usted. No se haga lavados vaginales ni use tampones. No use toallas higinicas perfumadas. No consuma ningn producto que contenga nicotina o tabaco, como cigarrillos, cigarrillos electrnicos y tabaco de Theatre manager. Si necesita ayuda para dejar de consumir estos productos, consulte al mdico. No use ningn remedio a base de hierbas, drogas ilegales o medicamentos que no le hayan sido recetados. Las sustancias qumicas de estos productos pueden daar al beb. No beba alcohol. Le realizarn exmenes prenatales ms frecuentes durante el tercer trimestre. Durante una visita prenatal de rutina, el mdico le har un examen fsico, Civil engineer, contracting pruebas y Heritage manager con usted de su salud general. Cumpla con todas las visitas de seguimiento. Esto es importante. Dnde buscar ms informacin American Pregnancy Association (Asociacin Estadounidense del [name]): americanpregnancy.org Celanese Corporation of Obstetricians and Gynecologists (Colegio Estadounidense de Obstetras y [place]): EmploymentAssurance.cz? Office on Pitney Bowes (Oficina para la Salud de la Mujer): MightyReward.co.nz Comunquese con un mdico si tiene: Teacher, English as a foreign language. Clicos leves en la pelvis, presin en la pelvis o dolor persistente en la zona abdominal o la parte baja de la espalda. Vmitos o diarrea. Secrecin vaginal con mal olor u orina con mal olor. Dolor al Beatrix Shipper. Un dolor de cabeza que no  desaparece despus de Science writer. Cambios en la visin o ve manchas delante de los ojos. Solicite ayuda de inmediato si: Rompe la bolsa. Tiene contracciones regulares separadas por menos de 5 minutos. Tiene sangrado o pequeas prdidas vaginales. Siente un dolor abdominal intenso. Tiene dificultad para respirar. Siente dolor en el pecho. Sufre episodios de Baxter International. No ha sentido a su beb moverse durante el perodo de Sempra Energy indic el mdico. Tiene dolor, hinchazn o enrojecimiento nuevos en un brazo o una pierna o se produce un aumento de alguno de estos sntomas. Resumen El tercer trimestre del Psychiatrist comprende desde la semana 28 hasta la semana 40 (desde el mes 7 hasta el mes 9). Puede tener ms problemas para dormir. Esto puede deberse al tamao de su abdomen, una mayor necesidad de orinar y un aumento en el metabolismo de su cuerpo. Le realizarn exmenes prenatales ms frecuentes durante el tercer trimestre. Cumpla con todas las visitas de seguimiento. Esto es importante. Esta informacin no tiene Lehman Brothers  fin reemplazar el consejo del mdico. Asegrese de hacerle al mdico cualquier pregunta que tenga. Document Revised: 06/10/2020 Document Reviewed: 06/10/2020 Elsevier Patient Education  2021 Elsevier Inc.  Dolor del ligamento redondo Round Ligament Pain  El ligamento redondo es un cordn de msculo y tejido que sirve de sostn para Careers information officer. Puede volverse una fuente de dolor durante el embarazo si se distiende o se torsiona a medida que el beb crece. Generalmente, el dolor Cendant Corporation trimestre (semanas 13 a 28) de [place], y Software engineer y Landscape architect el momento del [place]. No se trata de un problema grave y no es perjudicial para el beb. El dolor del ligamento redondo suele ser agudo y punzante, y durar poco tiempo, pero tambin puede ser sordo, persistente y continuo. Se lo percibe en la regin inferior del abdomen o en la ingle. A menudo comienza  en la zona ms profunda de la ingle y se extiende hacia regin externa de la cadera. El dolor puede producirse cuando usted: Cambia sbitamente de posicin, como pasar rpidamente de estar sentada a ponerse de pie. Se da vuelta en la cama. Tose o estornuda. Hace actividad fsica. Siga estas indicaciones en su casa: Controle su afeccin para detectar cualquier cambio. Cuando el dolor comience, reljese. Luego pruebe cualquiera de estos mtodos para Acupuncturist dolor: Sentarse. Flexionar las rodillas hacia el abdomen. Acostarse de costado con una almohada debajo del abdomen y Eastman Chemical las piernas. Sentarse en una baera con agua tibia durante 15 a 20 minutos o hasta que el dolor desaparezca. Tome los medicamentos de venta libre y los recetados solamente como se lo haya indicado el mdico. Muvase lentamente cuando se siente o se ponga de pie. No haga caminatas largas si le generan dolor. Suspenda o reduzca las actividades fsicas si Public relations account executive. Concurra a todas las visitas de control como se lo haya indicado el mdico. Esto es importante.   Comunquese con un mdico si: El dolor no desaparece con Scientist, research (medical). Tiene un dolor en la espalda que no tena antes. El medicamento no resulta eficaz. Solicite ayuda inmediatamente si: Tiene fiebre o escalofros. Tiene contracciones uterinas. Tiene una hemorragia vaginal abundante. Tiene nuseas o vmitos. Tiene diarrea. Siente dolor al ConocoPhillips. Resumen El dolor del ligamento redondo se siente en la parte inferior del abdomen o la ingle. Generalmente es un dolor agudo y punzante, y dura poco tiempo. Tambin puede ser un dolor sordo, persistente y continuo. Este dolor por lo general empieza en el segundo trimestre (semanas 13 a 28). Se produce porque el tero se estira a medida que el beb crece, y no es perjudicial para el beb. Usted puede notar el dolor cuando cambia sbitamente de posicin, cuando toce o estornuda, o durante la actividad  fsica. Relajarse, flexionar las rodillas hacia el abdomen, acostarse sobre un lado o tomar un bao de agua tibia pueden ayudar a Engineer, site. Solicite ayuda a su mdico si el dolor no desaparece o si tiene hemorragia vaginal, nuseas, vmitos, diarrea o dolor al ConocoPhillips. Esta informacin no tiene Theme park manager el consejo del mdico. Asegrese de hacerle al mdico cualquier pregunta que tenga. Document Revised: 07/17/2018 Document Reviewed: 07/17/2018 Elsevier Patient Education  2021 ArvinMeritor.

## 2021-05-25 NOTE — Progress Notes (Signed)
  [facility] Prenatal Visit  Amy Lloyd is a 35 y.o. [phone] at [redacted]w[redacted]d here for routine follow up. She is dated by early ultrasound.  She reports  still occasional left-sided abdominal pains . She reports fetal movement. She denies vaginal bleeding, contractions, or loss of fluid. See flow sheet for details.  Vitals:   05/25/21 1016  BP: 110/79  Pulse: 77      A/P: Pregnancy at [redacted]w[redacted]d.  Doing well.   Routine prenatal care:  Dating reviewed, dating tab is correct Fetal heart tones Appropriate Fundal height >2 cm from expected size given dating, discussed with preceptor.  Has ultrasound scheduled for 6/23 Infant feeding choice: Both  Contraception choice: IUD, will need to be done at HD. Infant circumcision desired not applicable  The patient does not have a history of Cesarean delivery and no referral to Center for [facility] is indicated Influenza vaccine not administered as not influenza season.   Tdap was not given today.  Received information and letter at last visit to get at [facility] Department.  1 hour glucola, CBC, RPR, and HIV were previously obtained.    Rh status was reviewed and patient does not need Rhogam.  Rhogam was not given today.  Pregnancy medical home was previously done and reviewed.   Childbirth and education classes were previously offered and information given to patient on how to access. Pregnancy education regarding benefits of breastfeeding, contraception, fetal growth, expected weight gain, and safe infant sleep were discussed.  Preterm labor and fetal movement precautions reviewed.  2. Pregnancy issues include the following and were addressed as appropriate today:   Round ligament pain: continues but is tolerable. Handout given and conservative measures discussed with patient. Received COVID booster vaccine today. There is some confusion about the medications she is taking currently (Pepcid and protonix?). It didn't seem  as though she ws sure of what she was taking, asked patient to bring in her medications at follow up.  Problem list and pregnancy box updated: Yes.   [facility]. Follow up 2 weeks with Dr. Annia Friendly.

## 2021-05-25 NOTE — Progress Notes (Signed)
   Covid-19 Vaccination Clinic  Name:  Sigourney Portillo    MRN: [id number] DOB: Jan 16, 1986  05/25/2021  Ms. Ramirez-Velazquez was observed post Covid-19 immunization for 15 minutes without incident. She was provided with Vaccine Information Sheet and instruction to access the V-Safe system.   Ms. Sambrano was instructed to call 911 with any severe reactions post vaccine: Difficulty breathing  Swelling of face and throat  A fast heartbeat  A bad rash all over body  Dizziness and weakness

## 2021-06-08 ENCOUNTER — Other Ambulatory Visit: Payer: Self-pay

## 2021-06-08 ENCOUNTER — Ambulatory Visit (INDEPENDENT_AMBULATORY_CARE_PROVIDER_SITE_OTHER): Payer: Self-pay | Admitting: Family Medicine

## 2021-06-08 VITALS — BP 102/64 | HR 107 | Wt 191.6 lb

## 2021-06-08 DIAGNOSIS — Z3480 Encounter for supervision of other normal pregnancy, unspecified trimester: Secondary | ICD-10-CM

## 2021-06-08 NOTE — Progress Notes (Signed)
  [facility] Prenatal Visit  Amy Lloyd is a 35 y.o. [phone] at [redacted]w[redacted]d here for routine follow up. She is dated by early ultrasound.  She reports no complaints.  She reports fetal movement. She denies vaginal bleeding, contractions, or loss of fluid.  See flow sheet for details.  Taking Pepcid for acid reflux.  Follow up U/S on 6/23 for fundal height larger than dates, completed this morning and do not have results yet. Dated appropriately via 8 week U/S.  Vitals:   06/08/21 1337  BP: 102/64  Pulse: (!) 107   Fundal height: 31cm FHT: 130-135 bpm Gen: NAD, well appearing     A/P: Pregnancy at [redacted]w[redacted]d.  Doing well.   Routine prenatal care:  Dating reviewed, dating tab is correct Fetal heart tones: Appropriate Fundal height: within expected range.  Appears appropriate today compared to 2 weeks ago)  The patient does not have a history of HSV and valacyclovir is not at this time.  The patient does not have a history of Cesarean delivery and no referral to Center for [facility] is indicated Infant feeding choice: Breastfeeding Contraception choice: IUD Infant circumcision desired not applicable Influenza vaccine not administered as not influenza season.   Tdap was not given today, has appt at HD on Monday for this . Childbirth and education classes were offered. Pregnancy education regarding benefits of breastfeeding, contraception, fetal growth, expected weight gain, and safe infant sleep were discussed.  Preterm labor and fetal movement precautions reviewed.   2. Pregnancy issues include the following and were addressed as appropriate today: Fundal height measuring larger than dates: Occurred on most recent office visit, however appropriate today.  Follow-up U/S was performed at [facility] today, will obtain records. GERD: On Pepcid, tolerating well. Breech on 04/06/2021 ultrasound, will reassess on recent U/S.   Problem list and pregnancy box updated:  Yes.   Scheduled with Dr. Melba Coon on 7/8 for f/u. MAU precautions discussed.   Follow up 2 weeks.   Allayne Stack, DO

## 2021-06-08 NOTE — Patient Instructions (Addendum)
[email or web address]   Si no siente 10 movimientos en 2 horas, por favor vaya a la unidad de maternidad.    A fetal movement count  is the number of times that you feel your baby move during a certain amount of time. This may also be called a fetal kick count. A fetal movement count is recommended for every pregnant woman. You may be asked to start counting fetal movements as early as week 28 of your pregnancy. Pay attention to when your baby is most active. You may notice your baby's sleep and wake cycles. You may also notice things that make your baby move more. You should do a fetal movement count: When your baby is normally most active. At the same time each day. A good time to count movements is while you are resting, after having something to eat and drink. How do I count fetal movements? Find a quiet, comfortable area. Sit, or lie down on your side. Write down the date, the start time and stop time, and the number of movements that you felt between those two times. Take this information with you to your health care visits. Write down your start time when you feel the first movement. Count kicks, flutters, swishes, rolls, and jabs. You should feel at least 10 movements. You may stop counting after you have felt 10 movements, or if you have been counting for 2 hours. Write down the stop time. If you do not feel 10 movements in 2 hours, contact your health care provider for further instructions. Your health care provider may want to do additional tests to assess your baby's well-being. Contact a health care provider if: You feel fewer than 10 movements in 2 hours. Your baby is not moving like he or she usually does. Date: ____________ Start time: ____________ Stop time: ____________ Movements: ____________ Date: ____________ Start time: ____________ Stop time: ____________ Movements: ____________ Date: ____________ Start time: ____________ Stop time: ____________ Movements:  ____________ Date: ____________ Start time: ____________ Stop time: ____________ Movements: ____________ Date: ____________ Start time: ____________ Stop time: ____________ Movements: ____________ Date: ____________ Start time: ____________ Stop time: ____________ Movements: ____________ Date: ____________ Start time: ____________ Stop time: ____________ Movements: ____________ Date: ____________ Start time: ____________ Stop time: ____________ Movements: ____________ Date: ____________ Start time: ____________ Stop time: ____________ Movements: ____________ This information is not intended to replace advice given to you by your health care provider. Make sure you discuss any questions you have with your health care provider. Document Revised: 07/23/2019 Document Reviewed: 07/23/2019 Elsevier Patient Education  2020 ArvinMeritor.

## 2021-06-12 ENCOUNTER — Inpatient Hospital Stay (HOSPITAL_COMMUNITY)
Admission: AD | Admit: 2021-06-12 | Discharge: 2021-06-13 | Disposition: A | Discharge status: Home / Self Care | Payer: Self-pay | Attending: Obstetrics and Gynecology | Admitting: Obstetrics and Gynecology

## 2021-06-12 ENCOUNTER — Encounter (HOSPITAL_COMMUNITY): Payer: Self-pay | Admitting: Obstetrics and Gynecology

## 2021-06-12 DIAGNOSIS — O99353 Diseases of the nervous system complicating pregnancy, third trimester: Secondary | ICD-10-CM | POA: Insufficient documentation

## 2021-06-12 DIAGNOSIS — O479 False labor, unspecified: Secondary | ICD-10-CM

## 2021-06-12 DIAGNOSIS — G44209 Tension-type headache, unspecified, not intractable: Secondary | ICD-10-CM | POA: Insufficient documentation

## 2021-06-12 DIAGNOSIS — Z20822 Contact with and (suspected) exposure to covid-19: Secondary | ICD-10-CM | POA: Insufficient documentation

## 2021-06-12 DIAGNOSIS — O4703 False labor before 37 completed weeks of gestation, third trimester: Secondary | ICD-10-CM | POA: Insufficient documentation

## 2021-06-12 DIAGNOSIS — Z3A31 31 weeks gestation of pregnancy: Secondary | ICD-10-CM | POA: Insufficient documentation

## 2021-06-12 LAB — CBC
HCT: 32.2 % — ABNORMAL LOW (ref 36.0–46.0)
Hemoglobin: 10.8 g/dL — ABNORMAL LOW (ref 12.0–15.0)
MCH: 28.7 pg (ref 26.0–34.0)
MCHC: 33.5 g/dL (ref 30.0–36.0)
MCV: 85.6 fL (ref 80.0–100.0)
Platelets: 135 10*3/uL — ABNORMAL LOW (ref 150–400)
RBC: 3.76 MIL/uL — ABNORMAL LOW (ref 3.87–5.11)
RDW: 13.1 % (ref 11.5–15.5)
WBC: 7.8 10*3/uL (ref 4.0–10.5)
nRBC: 0 % (ref 0.0–0.2)

## 2021-06-12 LAB — URINALYSIS, ROUTINE W REFLEX MICROSCOPIC
Bilirubin Urine: NEGATIVE
Glucose, UA: NEGATIVE mg/dL
Hgb urine dipstick: NEGATIVE
Ketones, ur: 20 mg/dL — AB
Nitrite: NEGATIVE
Protein, ur: NEGATIVE mg/dL
Specific Gravity, Urine: 1.015 (ref 1.005–1.030)
pH: 6 (ref 5.0–8.0)

## 2021-06-12 LAB — PROTEIN / CREATININE RATIO, URINE
Creatinine, Urine: 117.45 mg/dL
Protein Creatinine Ratio: 0.09 mg/mg{Cre} (ref 0.00–0.15)
Total Protein, Urine: 11 mg/dL

## 2021-06-12 LAB — RESP PANEL BY RT-PCR (FLU A&B, COVID) ARPGX2
Influenza A by PCR: NEGATIVE
Influenza B by PCR: NEGATIVE
SARS Coronavirus 2 by RT PCR: NEGATIVE

## 2021-06-12 MED ORDER — ACETAMINOPHEN 500 MG PO TABS
1000.0000 mg | ORAL_TABLET | Freq: Once | ORAL | Status: AC
  Administered 2021-06-12: 1000 mg via ORAL
  Filled 2021-06-12: qty 2

## 2021-06-12 NOTE — MAU Provider Note (Signed)
Chief Complaint:  Headache and Abdominal Pain   None    HPI: Amy Lloyd is a 35 y.o. G4P3003 at [redacted]w[redacted]d by 8 week ultrasound who presents to maternity admissions reporting concern of headache and vision changes. Specifically pt reports bilateral headache rated 6/10 in addition to blurred vision. Pt reports that she took tylenol 325mg  once at 1600 with minimal relief. Pt also reports intermittent shortness of breath without prior history of asthma or known cardiac conditions. No known sick contacts. Of note, pt received tetanus vaccine earlier today. She reports good fetal movement, denies LOF, vaginal bleeding, vaginal itching/burning, urinary symptoms, n/v, or fever/chills.    Past Medical History: Past Medical History:  Diagnosis Date   Gallbladder attack     Past obstetric history: OB History  Gravida Para Term Preterm AB Living  4 3 3     3   SAB IAB Ectopic Multiple Live Births        0 3    # Outcome Date GA Lbr Len/2nd Weight Sex Delivery Anes PTL Lv  4 Current           3 Term 10/02/19 [redacted]w[redacted]d 06:28 / 00:32 3860 g F Vag-Spont EPI  LIV  2 Term 05/15/12 [redacted]w[redacted]d 12:42 / 00:21 2951 g F Vag-Spont EPI  LIV     Birth Comments: na  1 Term 11/13/04    [redacted]w[redacted]d    Past Surgical History: Past Surgical History:  Procedure Laterality Date   APPENDECTOMY  09/26/2015   "lap"   CHOLECYSTECTOMY N/A 11/12/2017   Procedure: LAPAROSCOPIC CHOLECYSTECTOMY;  Surgeon: 11/26/2015, MD;  Location: [facility] OR;  Service: General;  Laterality: N/A;   LAPAROSCOPIC APPENDECTOMY N/A 09/26/2015   Procedure: APPENDECTOMY LAPAROSCOPIC;  Surgeon: [facility], MD;  Location: [facility] OR;  Service: General;  Laterality: N/A;    Family History: History reviewed. No pertinent family history.  Social History: Social History   Tobacco Use   Smoking status: Never   Smokeless tobacco: Never  Vaping Use   Vaping Use: Never used  Substance Use Topics   Alcohol use: No   Drug use: No     Allergies: No Known Allergies  Meds:  No medications prior to admission.    ROS:  Review of Systems  Constitutional:  Positive for chills. Negative for fever.  HENT:  Negative for congestion and sore throat.   Eyes:  Positive for visual disturbance. Negative for photophobia.  Respiratory:  Positive for shortness of breath. Negative for chest tightness.   Cardiovascular:  Negative for chest pain, palpitations and leg swelling.  Gastrointestinal:  Negative for abdominal pain, constipation, diarrhea, nausea and vomiting.  Genitourinary:  Negative for dysuria, vaginal bleeding, vaginal discharge and vaginal pain.  Neurological:  Positive for headaches. Negative for dizziness and light-headedness.    I have reviewed patient's Past Medical Hx, Surgical Hx, Family Hx, Social Hx, medications and allergies.   Physical Exam  Patient Vitals for the past 24 hrs:  BP Temp Temp src Pulse Resp SpO2 Height Weight  06/13/21 0042 111/68 -- -- 84 -- -- -- --  06/13/21 0039 -- -- -- -- -- 98 % -- --  06/12/21 2208 106/79 98.2 F (36.8 C) Oral 99 18 -- 5' (1.524 m) 87 kg   Constitutional: Well-developed, well-nourished female in no acute distress.  Cardiovascular: normal rate Respiratory: normal effort GI: Abd soft, non-tender, gravid appropriate for gestational age.  MS: Extremities nontender, no edema, normal ROM Neurologic: Alert and oriented x 4.  Bimanual exam: Cervix FT/thick/high, soft Dilation: 1 Effacement (%): Thick Station: Ballotable Exam by:: Lynnda Shields, MD  FHT:  Baseline 135, moderate variability, accelerations present, no decelerations Contractions: none   Labs: Results for orders placed or performed during the hospital encounter of 06/12/21 (from the past 24 hour(s))  Protein / creatinine ratio, urine     Status: None   Collection Time: 06/12/21 10:27 PM  Result Value Ref Range   Creatinine, Urine 117.45 mg/dL   Total Protein, Urine 11 mg/dL   Protein Creatinine  Ratio 0.09 0.00 - 0.15 mg/mg[Cre]  Resp Panel by RT-PCR (Flu A&B, Covid) Nasopharyngeal Swab     Status: None   Collection Time: 06/12/21 10:51 PM   Specimen: Nasopharyngeal Swab; Nasopharyngeal(NP) swabs in vial transport medium  Result Value Ref Range   SARS Coronavirus 2 by RT PCR NEGATIVE NEGATIVE   Influenza A by PCR NEGATIVE NEGATIVE   Influenza B by PCR NEGATIVE NEGATIVE  Comprehensive metabolic panel     Status: Abnormal   Collection Time: 06/12/21 11:01 PM  Result Value Ref Range   Sodium 135 135 - 145 mmol/L   Potassium 3.4 (L) 3.5 - 5.1 mmol/L   Chloride 105 98 - 111 mmol/L   CO2 19 (L) 22 - 32 mmol/L   Glucose, Bld 80 70 - 99 mg/dL   BUN 6 6 - 20 mg/dL   Creatinine, Ser 1.76 0.44 - 1.00 mg/dL   Calcium 8.6 (L) 8.9 - 10.3 mg/dL   Total Protein 5.8 (L) 6.5 - 8.1 g/dL   Albumin 2.3 (L) 3.5 - 5.0 g/dL   AST 21 15 - 41 U/L   ALT 22 0 - 44 U/L   Alkaline Phosphatase 114 38 - 126 U/L   Total Bilirubin 0.8 0.3 - 1.2 mg/dL   GFR, Estimated >16 >07 mL/min   Anion gap 11 5 - 15  CBC     Status: Abnormal   Collection Time: 06/12/21 11:01 PM  Result Value Ref Range   WBC 7.8 4.0 - 10.5 K/uL   RBC 3.76 (L) 3.87 - 5.11 MIL/uL   Hemoglobin 10.8 (L) 12.0 - 15.0 g/dL   HCT 37.1 (L) 06.2 - 69.4 %   MCV 85.6 80.0 - 100.0 fL   MCH 28.7 26.0 - 34.0 pg   MCHC 33.5 30.0 - 36.0 g/dL   RDW 85.4 62.7 - 03.5 %   Platelets 135 (L) 150 - 400 K/uL   nRBC 0.0 0.0 - 0.2 %  Urinalysis, Routine w reflex microscopic Urine, Clean Catch     Status: Abnormal   Collection Time: 06/12/21 11:01 PM  Result Value Ref Range   Color, Urine YELLOW YELLOW   APPearance HAZY (A) CLEAR   Specific Gravity, Urine 1.015 1.005 - 1.030   pH 6.0 5.0 - 8.0   Glucose, UA NEGATIVE NEGATIVE mg/dL   Hgb urine dipstick NEGATIVE NEGATIVE   Bilirubin Urine NEGATIVE NEGATIVE   Ketones, ur 20 (A) NEGATIVE mg/dL   Protein, ur NEGATIVE NEGATIVE mg/dL   Nitrite NEGATIVE NEGATIVE   Leukocytes,Ua LARGE (A) NEGATIVE   RBC  / HPF 0-5 0 - 5 RBC/hpf   WBC, UA 11-20 0 - 5 WBC/hpf   Bacteria, UA RARE (A) NONE SEEN   Squamous Epithelial / LPF 0-5 0 - 5   Mucus PRESENT    O/Positive/-- (03/30 1014)  Imaging:  No results found.  MAU Course/MDM: Orders Placed This Encounter  Procedures   Resp Panel by RT-PCR (Flu A&B, Covid) Nasopharyngeal  Swab   OB Urine Culture   Comprehensive metabolic panel   CBC   Protein / creatinine ratio, urine   Urinalysis, Routine w reflex microscopic Urine, Clean Catch   Airborne and Contact precautions   Discharge patient Discharge disposition: 01-Home or Self Care; Discharge patient date: 06/13/2021    Meds ordered this encounter  Medications   acetaminophen (TYLENOL) tablet 1,000 mg     NST reviewed and reactive. Treatments in MAU included tylenol.   Pt discharge with strict return precautions for recurrent headache, contractions, vaginal bleeding, leakage of fluid, decreased fetal movement or other concerns.  Assessment: Amy Lloyd is a 34 y.o. G4P3003 at [redacted]w[redacted]d by 8 week ultrasound who presents to maternity admissions reporting concern of headache, bilateral blurred vision and intermittent shortness of breath after receiving tetanus vaccine today. Reassuringly, pt's symptoms nearly resolved s/p tylenol. Normal maternal vitals and normal preeclampsia labs. Reactive FHT without evidence of preterm labor.  1. Acute non intractable tension-type headache   2. False labor    Plan: Discharge home; f/u with prenatal provider on 7/8 as previously scheduled. Labor precautions and fetal kick counts  Allergies as of 06/13/2021   No Known Allergies      Medication List     TAKE these medications    acetaminophen 325 MG tablet Commonly known as: Tylenol Take 2 tablets (650 mg total) by mouth every 6 (six) hours as needed (for pain scale < 4).   aspirin EC 81 MG tablet Take 1 tablet (81 mg total) by mouth daily. Swallow whole.   famotidine 20 MG  tablet Commonly known as: PEPCID Take 1 tablet (20 mg total) by mouth 2 (two) times daily.   ferrous sulfate 325 (65 FE) MG tablet Commonly known as: FerrouSul Take 1 tablet (325 mg total) by mouth 2 (two) times daily.   ondansetron 4 MG disintegrating tablet Commonly known as: Zofran ODT Take 1 tablet (4 mg total) by mouth every 8 (eight) hours as needed for nausea or vomiting.   pyridOXINE 50 MG tablet Commonly known as: VITAMIN B-6 Take 1 tablet (50 mg total) by mouth daily.       Sheila Oats, MD OB Fellow, Faculty Practice 06/13/2021 1:01 AM

## 2021-06-12 NOTE — MAU Note (Signed)
Pt says when she wipes - has pink D/C - started Saturday Has H/A , blurry vision - started today.  Got  Tetanus shot today . Took reg Tylenol 1 tab- at 4 pm   PNC with - FP- on [address]. Has had abd pain- started at 11am.  Last sex- 3 weeks ago

## 2021-06-13 LAB — COMPREHENSIVE METABOLIC PANEL
ALT: 22 U/L (ref 0–44)
AST: 21 U/L (ref 15–41)
Albumin: 2.3 g/dL — ABNORMAL LOW (ref 3.5–5.0)
Alkaline Phosphatase: 114 U/L (ref 38–126)
Anion gap: 11 (ref 5–15)
BUN: 6 mg/dL (ref 6–20)
CO2: 19 mmol/L — ABNORMAL LOW (ref 22–32)
Calcium: 8.6 mg/dL — ABNORMAL LOW (ref 8.9–10.3)
Chloride: 105 mmol/L (ref 98–111)
Creatinine, Ser: 0.53 mg/dL (ref 0.44–1.00)
GFR, Estimated: >60 mL/min (ref >60)
Glucose, Bld: 80 mg/dL (ref 70–99)
Potassium: 3.4 mmol/L — ABNORMAL LOW (ref 3.5–5.1)
Sodium: 135 mmol/L (ref 135–145)
Total Bilirubin: 0.8 mg/dL (ref 0.3–1.2)
Total Protein: 5.8 g/dL — ABNORMAL LOW (ref 6.5–8.1)

## 2021-06-14 LAB — CULTURE, OB URINE

## 2021-06-20 NOTE — Progress Notes (Signed)
  [facility] Prenatal Visit  Amy Lloyd is a 35 y.o. [phone] at [redacted]w[redacted]d here for routine follow up. She is dated by early ultrasound.  She reports no complaints.  She reports fetal movement. She denies vaginal bleeding, contractions, or loss of fluid.  See flow sheet for details.  Vitals:   06/23/21 1409  BP: 96/62  Pulse: 91     A/P: Pregnancy at [redacted]w[redacted]d.  Doing well.   Routine prenatal care:  Dating reviewed, dating tab is correct Fetal heart tones: Appropriate Fundal height: >2 cm from expected size given dating, discussed with preceptor.  The patient does not have a history of HSV and valacyclovir is not indicated at this time.  The patient does not have a history of Cesarean delivery and no referral to [facility] for [facility] is indicated Infant feeding choice: Breastfeeding Contraception choice: IUD, will need to be done at health department as patient is [employer] Infant circumcision desired not applicable Influenza vaccine not administered as not influenza season.   Tdap was not given today. Previously received at health department on 6/27. COVID vaccination was discussed and has already received booster.  Childbirth and education classes were previously offered and information given to patient. Pregnancy education regarding benefits of breastfeeding, contraception, fetal growth, expected weight gain, and safe infant sleep were discussed.  Preterm labor and fetal movement precautions reviewed.   2. Pregnancy issues include the following and were addressed as appropriate today: Polyhydramnios seen on most recent U/S 06/08/21. Will transfer care to [facility]. Unable to contact today as office is closed, patient given number to contact and referral sent.  Will f/u with patient regarding follow up fetal BPP and U/S CCM referral for financial assistance    Breech in third trimester: Will need consult for ECV with [facility] after 36 weeks if still  breech. Problem list and pregnancy box updated: Yes.   Has already been scheduled for Ob Faculty clinic in third trimester.  Follow up 2 weeks.

## 2021-06-22 NOTE — Patient Instructions (Addendum)
It was wonderful to see you today.  Please bring ALL of your medications with you to every visit.   Today we talked about:  Your ultrasound result showed that you have a condition called polyhydramnios which is caused by extra amniotic fluid. We will transfer your care to [facility]. I have placed a referral, if you don't hear from them in the next week the number is [phone].   We will contact you with your next appointment times.  Fue maravilloso verte hoy.  Por favor traiga TODOS sus medicamentos a cada visita.  Hoy hablamos de:  El resultado de su ultrasonido mostr que usted tiene una condicin llamada polihidramnios que es causada por exceso de lquido amnitico. Transferiremos su atencin a [facility]. He colocado una referencia, si no tiene noticias de ellos en la prxima semana, el nmero es [phone].  Nos pondremos en contacto con usted con los horarios de su prxima cita.  Si experimenta sangrado vaginal, prdida de lquidos, no siente que su beb se mueva tanto o comienza a Technical brewer con menos de 5 minutos de diferencia, vaya directamente a la Unidad de Evaluacin Materna en [facility] para una evaluacin.  Servicios de atencin de mujeres y maternidad ubicados en el lado sur de [place]. [facility] en [address] [place], [place] [address]     Thank you for choosing [facility].   Please call [phone] with any questions about today's appointment.   Sabino Dick, DO PGY-2 Family Medicine

## 2021-06-23 ENCOUNTER — Ambulatory Visit (INDEPENDENT_AMBULATORY_CARE_PROVIDER_SITE_OTHER): Payer: Self-pay | Admitting: Family Medicine

## 2021-06-23 ENCOUNTER — Other Ambulatory Visit: Payer: Self-pay

## 2021-06-23 VITALS — BP 96/62 | HR 91 | Wt 191.2 lb

## 2021-06-23 DIAGNOSIS — Z3480 Encounter for supervision of other normal pregnancy, unspecified trimester: Secondary | ICD-10-CM

## 2021-06-23 DIAGNOSIS — O403XX0 Polyhydramnios, third trimester, not applicable or unspecified: Secondary | ICD-10-CM

## 2021-06-23 DIAGNOSIS — O409XX0 Polyhydramnios, unspecified trimester, not applicable or unspecified: Secondary | ICD-10-CM | POA: Insufficient documentation

## 2021-06-23 NOTE — Addendum Note (Signed)
Addended by: Lamonte Sakai, Yordi Krager D on: 06/23/2021 03:20 PM   Modules accepted: Orders

## 2021-06-23 NOTE — Addendum Note (Signed)
Addended by: Manson Passey, Truda Staub on: 06/23/2021 03:18 PM   Modules accepted: Orders

## 2021-06-23 NOTE — Addendum Note (Signed)
Addended by: Manson Passey, Isatou Agredano on: 06/23/2021 03:16 PM   Modules accepted: Orders

## 2021-06-26 ENCOUNTER — Other Ambulatory Visit: Payer: Self-pay | Admitting: *Deleted

## 2021-06-26 ENCOUNTER — Telehealth: Payer: Self-pay | Admitting: *Deleted

## 2021-06-26 ENCOUNTER — Other Ambulatory Visit: Payer: Self-pay | Admitting: Family Medicine

## 2021-06-26 ENCOUNTER — Telehealth: Payer: Self-pay | Admitting: Family Medicine

## 2021-06-26 ENCOUNTER — Ambulatory Visit: Payer: Self-pay | Attending: Family Medicine

## 2021-06-26 ENCOUNTER — Other Ambulatory Visit: Payer: Self-pay

## 2021-06-26 ENCOUNTER — Ambulatory Visit (HOSPITAL_BASED_OUTPATIENT_CLINIC_OR_DEPARTMENT_OTHER): Payer: Self-pay | Admitting: Obstetrics

## 2021-06-26 DIAGNOSIS — Z3A33 33 weeks gestation of pregnancy: Secondary | ICD-10-CM | POA: Insufficient documentation

## 2021-06-26 DIAGNOSIS — Z3480 Encounter for supervision of other normal pregnancy, unspecified trimester: Secondary | ICD-10-CM

## 2021-06-26 DIAGNOSIS — O409XX0 Polyhydramnios, unspecified trimester, not applicable or unspecified: Secondary | ICD-10-CM

## 2021-06-26 DIAGNOSIS — O403XX0 Polyhydramnios, third trimester, not applicable or unspecified: Secondary | ICD-10-CM

## 2021-06-26 NOTE — Progress Notes (Signed)
MFM Note  Amy Lloyd was seen for a detailed fetal anatomy scan due to polyhydramnios that was noted on a recent ultrasound performed in your office.  The patient has screened negative for gestational diabetes in her current pregnancy.  She denies any significant past medical history and denies any other problems in her current pregnancy.    On today's exam, the fetus measures large for her gestational age (6 pounds 8 ounces, 98th percentile).  Polyhydramnios with a total AFI of 29.8 cm was noted.  Although the views of the fetal anatomy were limited today due to her advanced gestational age, there were no obvious fetal anomalies to account for the polyhydramnios noted today.  The patient was informed that anomalies may be missed due to technical limitations. If the fetus is in a suboptimal position or maternal habitus is increased, visualization of the fetus in the maternal uterus may be impaired.  A biophysical profile performed today was 8 out of 8.  Vigorous fetal movements were noted throughout today's exam.  The patient was reassured that as she has screened negative for gestational diabetes, the polyhydramnios noted today may just be a normal variant.  She was advised that polyhydramnios increases the risk of preterm labor/contractions and PPROM.   Due to polyhydramnios, we will continue to follow her with weekly biophysical profiles and amniotic fluid checks.  Another biophysical profile was scheduled in our office in 1 week.    We will perform another growth ultrasound in 4 weeks to estimate the fetal weight closer to delivery. She should probably be delivered at around 39 weeks.  A total of 30 minutes was spent counseling and coordinating the care for this patient.  Greater than 50% of the time was spent in direct face-to-face contact.

## 2021-06-26 NOTE — Telephone Encounter (Signed)
Called [facility] to discuss transfer of prenatal care as patient is now considered high-risk with polyhydramnios and LGA fetus. Appointment scheduled for July 25th at 230PM.  Used Spanish interpreter Coralie Common [phone] to call patient and advise her of this appointment. Address and telephone number were provided to patient.

## 2021-06-26 NOTE — Telephone Encounter (Signed)
   Telephone encounter was:  Unsuccessful.  06/26/2021 Name: Amy Lloyd MRN: [id number] DOB: [birth year]  Unsuccessful outbound call made today to assist with:  \  Outreach Attempt:  1st Attempt  A HIPAA compliant voice message was left requesting a return call.  Instructed patient to call back   Alois Cliche -[facility] Guide , Embedded Care Coordination [facility], Care Management  [phone] [address] [place] , [place] [address] Email : [email or web address]

## 2021-06-27 ENCOUNTER — Other Ambulatory Visit: Payer: Self-pay | Admitting: Family Medicine

## 2021-06-27 ENCOUNTER — Telehealth: Payer: Self-pay | Admitting: *Deleted

## 2021-06-27 DIAGNOSIS — Z3480 Encounter for supervision of other normal pregnancy, unspecified trimester: Secondary | ICD-10-CM

## 2021-06-27 NOTE — Telephone Encounter (Signed)
   Telephone encounter was:  Successful.  06/27/2021 Name: Amy Lloyd MRN: [id number] DOB: October 25, 1986  Amy Lloyd is a 35 y.o. year old female who is a primary care patient of Claiborne Rigg, NP . The community resource team was consulted for assistance with the intrepreter placed call to patient. [phone]) Javierpatient says he has applied for medicaid and is getting ultrasound done and having no financial difficulties if more outreach needed , I need more specific questions   Care guide performed the following interventions: Patient provided with information about care guide support team and interviewed to confirm resource needs.  Follow Up Plan:  No further follow up planned at this time. The patient has been provided with needed resources.  Alois Cliche -[facility] Guide , Embedded Care Coordination [facility], Care Management  [phone] [address] [place] , [place] [address] Email : [email or web address]

## 2021-07-03 ENCOUNTER — Ambulatory Visit: Payer: Self-pay | Attending: Obstetrics | Admitting: *Deleted

## 2021-07-03 ENCOUNTER — Ambulatory Visit: Payer: Self-pay | Admitting: *Deleted

## 2021-07-03 ENCOUNTER — Encounter: Payer: Self-pay | Admitting: *Deleted

## 2021-07-03 ENCOUNTER — Other Ambulatory Visit: Payer: Self-pay

## 2021-07-03 VITALS — BP 113/71 | HR 83

## 2021-07-03 DIAGNOSIS — Z3A33 33 weeks gestation of pregnancy: Secondary | ICD-10-CM

## 2021-07-03 DIAGNOSIS — O409XX0 Polyhydramnios, unspecified trimester, not applicable or unspecified: Secondary | ICD-10-CM

## 2021-07-03 DIAGNOSIS — Z3A00 Weeks of gestation of pregnancy not specified: Secondary | ICD-10-CM | POA: Insufficient documentation

## 2021-07-03 DIAGNOSIS — O403XX0 Polyhydramnios, third trimester, not applicable or unspecified: Secondary | ICD-10-CM | POA: Insufficient documentation

## 2021-07-03 NOTE — Procedures (Signed)
Amy Lloyd [birth year] [redacted]w[redacted]d  Fetus A Non-Stress Test Interpretation for 07/03/21  Indication: Polyhydramnios  Fetal Heart Rate A Mode: External Baseline Rate (A): 150 bpm Variability: Moderate Accelerations: 15 x 15 Decelerations: None Multiple birth?: No  Uterine Activity Mode: Palpation, Toco Contraction Frequency (min): 1-3 Contraction Duration (sec): 30-50 Contraction Quality: Mild Resting Tone Palpated: Relaxed Resting Time: Adequate  Interpretation (Fetal Testing) Nonstress Test Interpretation: Reactive Comments: Dr. Judeth Cornfield reviewed tracing.

## 2021-07-10 ENCOUNTER — Ambulatory Visit (INDEPENDENT_AMBULATORY_CARE_PROVIDER_SITE_OTHER): Payer: Self-pay | Admitting: Obstetrics and Gynecology

## 2021-07-10 ENCOUNTER — Other Ambulatory Visit (HOSPITAL_COMMUNITY)
Admission: RE | Admit: 2021-07-10 | Discharge: 2021-07-10 | Disposition: A | Discharge status: Home / Self Care | Payer: Self-pay | Source: Ambulatory Visit | Attending: Obstetrics and Gynecology | Admitting: Obstetrics and Gynecology

## 2021-07-10 ENCOUNTER — Encounter: Payer: Self-pay | Admitting: Obstetrics and Gynecology

## 2021-07-10 ENCOUNTER — Other Ambulatory Visit: Payer: Self-pay

## 2021-07-10 VITALS — BP 118/77 | HR 78 | Wt 195.3 lb

## 2021-07-10 DIAGNOSIS — Z3480 Encounter for supervision of other normal pregnancy, unspecified trimester: Secondary | ICD-10-CM

## 2021-07-10 DIAGNOSIS — O409XX0 Polyhydramnios, unspecified trimester, not applicable or unspecified: Secondary | ICD-10-CM

## 2021-07-10 NOTE — Progress Notes (Signed)
   PRENATAL VISIT NOTE  Subjective:  Amy Lloyd is a 35 y.o. G4P3003 at [redacted]w[redacted]d being seen today for ongoing prenatal care.  She is currently monitored for the following issues for this high-risk pregnancy and has Gastroesophageal reflux during pregnancy in second trimester, antepartum; Breech presentation, antepartum; Round ligament pain; Supervision of other normal pregnancy, antepartum; and Polyhydramnios affecting pregnancy on their problem list.  Patient reports no complaints.  Contractions: Irritability. Vag. Bleeding: None.  Movement: Present. Denies leaking of fluid.   The following portions of the patient's history were reviewed and updated as appropriate: allergies, current medications, past family history, past medical history, past social history, past surgical history and problem list.   Objective:   Vitals:   07/10/21 1436  BP: 118/77  Pulse: 78  Weight: 195 lb 4.8 oz (88.6 kg)    Fetal Status: Fetal Heart Rate (bpm): 140 Fundal Height: 40 cm Movement: Present     General:  Alert, oriented and cooperative. Patient is in no acute distress.  Skin: Skin is warm and dry. No rash noted.   Cardiovascular: Normal heart rate noted  Respiratory: Normal respiratory effort, no problems with respiration noted  Abdomen: Soft, gravid, appropriate for gestational age.  Pain/Pressure: Absent     Pelvic: Cervical exam performed in the presence of a chaperone        Extremities: Normal range of motion.  Edema: None  Mental Status: Normal mood and affect. Normal behavior. Normal judgment and thought content.   Assessment and Plan:  Pregnancy: G4P3003 at [redacted]w[redacted]d 1. Supervision of other normal pregnancy, antepartum Patient is doing well without complaints Cultures today  2. Polyhydramnios affecting pregnancy Follow up ultrasound tomorrow Plan for IOL at 39 weeks  Preterm labor symptoms and general obstetric precautions including but not limited to vaginal bleeding,  contractions, leaking of fluid and fetal movement were reviewed in detail with the patient. Please refer to After Visit Summary for other counseling recommendations.   Return in about 1 week (around 07/17/2021) for in person, ROB, High risk.  Future Appointments  Date Time Provider Department Center  07/11/2021 12:30 PM [facility] NURSE [facility]  07/11/2021 12:45 PM [facility] US5 [facility]  07/14/2021  8:30 AM Sabino Dick, DO [facility]  07/19/2021 10:30 AM [facility] NURSE [facility]  07/19/2021 10:45 AM [facility] US4 [facility]  07/24/2021 10:30 AM [facility] NURSE [facility]  07/24/2021 10:45 AM [facility] US4 [facility]    Catalina Antigua, MD

## 2021-07-10 NOTE — Progress Notes (Signed)
ROB 35.6 GBS and GC/CC today. No complaints.

## 2021-07-11 ENCOUNTER — Ambulatory Visit: Payer: Self-pay | Admitting: *Deleted

## 2021-07-11 ENCOUNTER — Ambulatory Visit: Payer: Self-pay | Attending: Obstetrics

## 2021-07-11 ENCOUNTER — Encounter: Payer: Self-pay | Admitting: *Deleted

## 2021-07-11 VITALS — BP 106/72 | HR 91

## 2021-07-11 DIAGNOSIS — O403XX0 Polyhydramnios, third trimester, not applicable or unspecified: Secondary | ICD-10-CM

## 2021-07-11 DIAGNOSIS — Z3A36 36 weeks gestation of pregnancy: Secondary | ICD-10-CM

## 2021-07-11 DIAGNOSIS — O0933 Supervision of pregnancy with insufficient antenatal care, third trimester: Secondary | ICD-10-CM

## 2021-07-11 DIAGNOSIS — O409XX0 Polyhydramnios, unspecified trimester, not applicable or unspecified: Secondary | ICD-10-CM | POA: Insufficient documentation

## 2021-07-11 DIAGNOSIS — Z362 Encounter for other antenatal screening follow-up: Secondary | ICD-10-CM

## 2021-07-12 ENCOUNTER — Encounter: Payer: Self-pay | Admitting: Obstetrics and Gynecology

## 2021-07-12 DIAGNOSIS — Z22330 Carrier of Group B streptococcus: Secondary | ICD-10-CM | POA: Insufficient documentation

## 2021-07-12 LAB — CERVICOVAGINAL ANCILLARY ONLY
Chlamydia: NEGATIVE
Comment: NEGATIVE
Comment: NORMAL
Neisseria Gonorrhea: NEGATIVE

## 2021-07-12 LAB — STREP GP B NAA: Strep Gp B NAA: POSITIVE — AB

## 2021-07-14 ENCOUNTER — Encounter: Payer: Self-pay | Admitting: Family Medicine

## 2021-07-19 ENCOUNTER — Other Ambulatory Visit: Payer: Self-pay

## 2021-07-19 ENCOUNTER — Ambulatory Visit: Payer: Self-pay | Admitting: *Deleted

## 2021-07-19 ENCOUNTER — Encounter: Payer: Self-pay | Admitting: *Deleted

## 2021-07-19 ENCOUNTER — Ambulatory Visit: Payer: Self-pay | Attending: Obstetrics

## 2021-07-19 ENCOUNTER — Ambulatory Visit (INDEPENDENT_AMBULATORY_CARE_PROVIDER_SITE_OTHER): Payer: Medicaid Other | Admitting: Obstetrics and Gynecology

## 2021-07-19 VITALS — BP 108/74 | HR 91 | Wt 198.0 lb

## 2021-07-19 VITALS — BP 122/69 | HR 91

## 2021-07-19 DIAGNOSIS — Z3A37 37 weeks gestation of pregnancy: Secondary | ICD-10-CM | POA: Insufficient documentation

## 2021-07-19 DIAGNOSIS — O403XX0 Polyhydramnios, third trimester, not applicable or unspecified: Secondary | ICD-10-CM

## 2021-07-19 DIAGNOSIS — Z362 Encounter for other antenatal screening follow-up: Secondary | ICD-10-CM

## 2021-07-19 DIAGNOSIS — O409XX0 Polyhydramnios, unspecified trimester, not applicable or unspecified: Secondary | ICD-10-CM | POA: Insufficient documentation

## 2021-07-19 DIAGNOSIS — Z22330 Carrier of Group B streptococcus: Secondary | ICD-10-CM | POA: Insufficient documentation

## 2021-07-19 DIAGNOSIS — O321XX0 Maternal care for breech presentation, not applicable or unspecified: Secondary | ICD-10-CM

## 2021-07-19 DIAGNOSIS — Z3480 Encounter for supervision of other normal pregnancy, unspecified trimester: Secondary | ICD-10-CM

## 2021-07-19 DIAGNOSIS — O0933 Supervision of pregnancy with insufficient antenatal care, third trimester: Secondary | ICD-10-CM

## 2021-07-19 NOTE — Progress Notes (Signed)
   PRENATAL VISIT NOTE  Subjective:  Amy Lloyd is a 35 y.o. G4P3003 at [redacted]w[redacted]d being seen today for ongoing prenatal care.  She is currently monitored for the following issues for this high-risk pregnancy and has Gastroesophageal reflux during pregnancy in second trimester, antepartum; Breech presentation, antepartum; Round ligament pain; Supervision of other normal pregnancy, antepartum; Polyhydramnios affecting pregnancy; GBS carrier; and [redacted] weeks gestation of pregnancy on their problem list.  Patient doing well with no acute concerns today. She reports occasional contractions.  Contractions: Irritability. Vag. Bleeding: None.  Movement: Present. Denies leaking of fluid.   The following portions of the patient's history were reviewed and updated as appropriate: allergies, current medications, past family history, past medical history, past social history, past surgical history and problem list. Problem list updated.  Objective:   Vitals:   07/19/21 0944  BP: 108/74  Pulse: 91  Weight: 198 lb (89.8 kg)    Fetal Status: Fetal Heart Rate (bpm): 141   Movement: Present     General:  Alert, oriented and cooperative. Patient is in no acute distress.  Skin: Skin is warm and dry. No rash noted.   Cardiovascular: Normal heart rate noted  Respiratory: Normal respiratory effort, no problems with respiration noted  Abdomen: Soft, gravid, appropriate for gestational age.  Pain/Pressure: Present     Pelvic: Cervical exam deferred        Extremities: Normal range of motion.  Edema: Trace  Mental Status:  Normal mood and affect. Normal behavior. Normal judgment and thought content.   Assessment and Plan:  Pregnancy: G4P3003 at [redacted]w[redacted]d  1. [redacted] weeks gestation of pregnancy   2. Supervision of other normal pregnancy, antepartum Continue routine care, per previous note pt will be scheduled for IOL at 39 weeks  3. Polyhydramnios affecting pregnancy Last AFI was 34, pt has BPP today and  follow up scan on 07/24/21  4. GBS carrier Treat in labor  5. Breech presentation with antenatal problem, single or unspecified fetus Due to moderate poly, fetal position is likely unstable, pt will definitely need confirmatory scan for position before start of IOL or AROM  Term labor symptoms and general obstetric precautions including but not limited to vaginal bleeding, contractions, leaking of fluid and fetal movement were reviewed in detail with the patient.  Please refer to After Visit Summary for other counseling recommendations.   Return in about 1 week (around 07/26/2021) for [facility], in person.   Mariel Aloe, MD Faculty Attending Center for [facility]

## 2021-07-24 ENCOUNTER — Ambulatory Visit: Payer: Self-pay | Admitting: *Deleted

## 2021-07-24 ENCOUNTER — Other Ambulatory Visit: Payer: Self-pay | Admitting: *Deleted

## 2021-07-24 ENCOUNTER — Ambulatory Visit: Payer: Self-pay | Attending: Obstetrics

## 2021-07-24 ENCOUNTER — Encounter: Payer: Self-pay | Admitting: *Deleted

## 2021-07-24 ENCOUNTER — Other Ambulatory Visit: Payer: Self-pay

## 2021-07-24 VITALS — BP 106/69 | HR 69

## 2021-07-24 DIAGNOSIS — Z362 Encounter for other antenatal screening follow-up: Secondary | ICD-10-CM

## 2021-07-24 DIAGNOSIS — O409XX0 Polyhydramnios, unspecified trimester, not applicable or unspecified: Secondary | ICD-10-CM | POA: Diagnosis present

## 2021-07-24 DIAGNOSIS — Z22330 Carrier of Group B streptococcus: Secondary | ICD-10-CM

## 2021-07-24 DIAGNOSIS — O0933 Supervision of pregnancy with insufficient antenatal care, third trimester: Secondary | ICD-10-CM

## 2021-07-24 DIAGNOSIS — Z3A37 37 weeks gestation of pregnancy: Secondary | ICD-10-CM

## 2021-07-24 DIAGNOSIS — O403XX0 Polyhydramnios, third trimester, not applicable or unspecified: Secondary | ICD-10-CM

## 2021-07-25 ENCOUNTER — Telehealth (HOSPITAL_COMMUNITY): Payer: Self-pay | Admitting: *Deleted

## 2021-07-25 ENCOUNTER — Encounter (HOSPITAL_COMMUNITY): Payer: Self-pay | Admitting: *Deleted

## 2021-07-25 NOTE — Telephone Encounter (Signed)
Preadmission screen  

## 2021-07-25 NOTE — Telephone Encounter (Signed)
[phone] interpreter number  Preadmission screen

## 2021-07-28 ENCOUNTER — Ambulatory Visit (INDEPENDENT_AMBULATORY_CARE_PROVIDER_SITE_OTHER): Payer: Medicaid Other | Admitting: Obstetrics & Gynecology

## 2021-07-28 ENCOUNTER — Other Ambulatory Visit: Payer: Self-pay

## 2021-07-28 VITALS — BP 117/75 | HR 82 | Wt 193.0 lb

## 2021-07-28 DIAGNOSIS — O409XX0 Polyhydramnios, unspecified trimester, not applicable or unspecified: Secondary | ICD-10-CM

## 2021-07-28 DIAGNOSIS — Z22330 Carrier of Group B streptococcus: Secondary | ICD-10-CM | POA: Diagnosis not present

## 2021-07-28 DIAGNOSIS — Z3480 Encounter for supervision of other normal pregnancy, unspecified trimester: Secondary | ICD-10-CM

## 2021-07-28 DIAGNOSIS — O3660X0 Maternal care for excessive fetal growth, unspecified trimester, not applicable or unspecified: Secondary | ICD-10-CM

## 2021-07-28 NOTE — Progress Notes (Signed)
   PRENATAL VISIT NOTE  Subjective:  Amy Lloyd is a 35 y.o. G4P3003 at [redacted]w[redacted]d being seen today for ongoing prenatal care.  She is currently monitored for the following issues for this high-risk pregnancy and has Gastroesophageal reflux during pregnancy in second trimester, antepartum; Breech presentation, antepartum; Round ligament pain; Supervision of other normal pregnancy, antepartum; Polyhydramnios affecting pregnancy; GBS carrier; and [redacted] weeks gestation of pregnancy on their problem list.  Patient reports occasional contractions.  Contractions: Not present. Vag. Bleeding: None.  Movement: Present. Denies leaking of fluid.   The following portions of the patient's history were reviewed and updated as appropriate: allergies, current medications, past family history, past medical history, past social history, past surgical history and problem list.   Objective:   Vitals:   07/28/21 1103  BP: 117/75  Pulse: 82  Weight: 193 lb (87.5 kg)    Fetal Status: Fetal Heart Rate (bpm): 155   Movement: Present     General:  Alert, oriented and cooperative. Patient is in no acute distress.  Skin: Skin is warm and dry. No rash noted.   Cardiovascular: Normal heart rate noted  Respiratory: Normal respiratory effort, no problems with respiration noted  Abdomen: Soft, gravid, appropriate for gestational age.  Pain/Pressure: Present     Pelvic: Cervical exam deferred        Extremities: Normal range of motion.     Mental Status: Normal mood and affect. Normal behavior. Normal judgment and thought content.   Assessment and Plan:  Pregnancy: G4P3003 at [redacted]w[redacted]d 1. GBS carrier Abx in labor  2. Polyhydramnios affecting pregnancy IOL 39 weeks  3. Supervision of other normal pregnancy, antepartum   4. Fetal macrosomia affecting management of mother, antepartum 4200 g EFW, tested pelvis  Term labor symptoms and general obstetric precautions including but not limited to vaginal  bleeding, contractions, leaking of fluid and fetal movement were reviewed in detail with the patient. Please refer to After Visit Summary for other counseling recommendations.   Return if symptoms worsen or fail to improve.  Future Appointments  Date Time Provider Department Center  07/31/2021 10:30 AM [facility] NURSE [facility]  07/31/2021 10:45 AM [facility] NST [facility]  08/01/2021  8:55 AM [facility] SCHED ROOM [facility] None    Scheryl Darter, MD

## 2021-07-31 ENCOUNTER — Other Ambulatory Visit: Payer: Self-pay | Admitting: Obstetrics & Gynecology

## 2021-07-31 ENCOUNTER — Ambulatory Visit: Payer: Medicaid Other | Attending: Obstetrics | Admitting: *Deleted

## 2021-07-31 ENCOUNTER — Ambulatory Visit: Payer: Self-pay | Admitting: *Deleted

## 2021-07-31 ENCOUNTER — Other Ambulatory Visit: Payer: Self-pay

## 2021-07-31 VITALS — BP 111/69 | HR 79

## 2021-07-31 DIAGNOSIS — O409XX0 Polyhydramnios, unspecified trimester, not applicable or unspecified: Secondary | ICD-10-CM | POA: Diagnosis not present

## 2021-07-31 DIAGNOSIS — O403XX1 Polyhydramnios, third trimester, fetus 1: Secondary | ICD-10-CM

## 2021-07-31 DIAGNOSIS — Z3A00 Weeks of gestation of pregnancy not specified: Secondary | ICD-10-CM | POA: Insufficient documentation

## 2021-07-31 DIAGNOSIS — Z22330 Carrier of Group B streptococcus: Secondary | ICD-10-CM

## 2021-07-31 NOTE — Procedures (Signed)
Amy Lloyd 09/07/1986 [redacted]w[redacted]d  Fetus A Non-Stress Test Interpretation for 07/31/21  Indication: Polyhydramnios  Fetal Heart Rate A Mode: External Baseline Rate (A): 140 bpm Variability: Moderate Accelerations: 15 x 15 Decelerations: None Multiple birth?: No  Uterine Activity Mode: Palpation, Toco Contraction Frequency (min): occ with ui Contraction Duration (sec): 50-110 Contraction Quality: Mild Resting Tone Palpated: Relaxed Resting Time: Adequate  Interpretation (Fetal Testing) Nonstress Test Interpretation: Reactive Overall Impression: Reassuring for gestational age Comments: Dr. Grace Bushy reviewed tracing

## 2021-08-01 ENCOUNTER — Inpatient Hospital Stay (HOSPITAL_COMMUNITY): Payer: Medicaid Other | Admitting: Anesthesiology

## 2021-08-01 ENCOUNTER — Other Ambulatory Visit: Payer: Self-pay

## 2021-08-01 ENCOUNTER — Inpatient Hospital Stay (HOSPITAL_COMMUNITY)
Admission: AD | Admit: 2021-08-01 | Discharge: 2021-08-03 | DRG: 807 | Disposition: A | Discharge status: Home / Self Care | Payer: Medicaid Other | Attending: Obstetrics and Gynecology | Admitting: Obstetrics and Gynecology

## 2021-08-01 ENCOUNTER — Inpatient Hospital Stay (HOSPITAL_COMMUNITY): Payer: Medicaid Other

## 2021-08-01 ENCOUNTER — Encounter (HOSPITAL_COMMUNITY): Payer: Self-pay | Admitting: Obstetrics and Gynecology

## 2021-08-01 DIAGNOSIS — O3663X0 Maternal care for excessive fetal growth, third trimester, not applicable or unspecified: Secondary | ICD-10-CM | POA: Diagnosis present

## 2021-08-01 DIAGNOSIS — Z22330 Carrier of Group B streptococcus: Secondary | ICD-10-CM

## 2021-08-01 DIAGNOSIS — O3660X0 Maternal care for excessive fetal growth, unspecified trimester, not applicable or unspecified: Secondary | ICD-10-CM

## 2021-08-01 DIAGNOSIS — O403XX0 Polyhydramnios, third trimester, not applicable or unspecified: Secondary | ICD-10-CM | POA: Diagnosis present

## 2021-08-01 DIAGNOSIS — Z3A39 39 weeks gestation of pregnancy: Secondary | ICD-10-CM | POA: Diagnosis not present

## 2021-08-01 DIAGNOSIS — Z7982 Long term (current) use of aspirin: Secondary | ICD-10-CM | POA: Diagnosis not present

## 2021-08-01 DIAGNOSIS — O99824 Streptococcus B carrier state complicating childbirth: Secondary | ICD-10-CM | POA: Diagnosis present

## 2021-08-01 HISTORY — DX: Gastro-esophageal reflux disease without esophagitis: K21.9

## 2021-08-01 LAB — CBC
HCT: 30.1 % — ABNORMAL LOW (ref 36.0–46.0)
Hemoglobin: 9.5 g/dL — ABNORMAL LOW (ref 12.0–15.0)
MCH: 24.4 pg — ABNORMAL LOW (ref 26.0–34.0)
MCHC: 31.6 g/dL (ref 30.0–36.0)
MCV: 77.2 fL — ABNORMAL LOW (ref 80.0–100.0)
Platelets: 133 10*3/uL — ABNORMAL LOW (ref 150–400)
RBC: 3.9 MIL/uL (ref 3.87–5.11)
RDW: 15.3 % (ref 11.5–15.5)
WBC: 6.1 10*3/uL (ref 4.0–10.5)
nRBC: 0 % (ref 0.0–0.2)

## 2021-08-01 LAB — PREPARE RBC (CROSSMATCH)

## 2021-08-01 LAB — SARS CORONAVIRUS 2 (TAT 6-24 HRS): SARS Coronavirus 2: NEGATIVE

## 2021-08-01 MED ORDER — LIDOCAINE-EPINEPHRINE (PF) 2 %-1:200000 IJ SOLN
INTRAMUSCULAR | Status: DC | PRN
  Administered 2021-08-01: 5 mL via EPIDURAL

## 2021-08-01 MED ORDER — PHENYLEPHRINE 40 MCG/ML (10ML) SYRINGE FOR IV PUSH (FOR BLOOD PRESSURE SUPPORT): 80.0000 ug | PREFILLED_SYRINGE | INTRAVENOUS | Status: DC | PRN

## 2021-08-01 MED ORDER — OXYTOCIN-SODIUM CHLORIDE 30-0.9 UT/500ML-% IV SOLN
1.0000 m[IU]/min | INTRAVENOUS | Status: DC
  Administered 2021-08-01: 2 m[IU]/min via INTRAVENOUS
  Filled 2021-08-01: qty 500

## 2021-08-01 MED ORDER — TERBUTALINE SULFATE 1 MG/ML IJ SOLN: 0.2500 mg | Freq: Once | INTRAMUSCULAR | Status: DC | PRN

## 2021-08-01 MED ORDER — OXYCODONE-ACETAMINOPHEN 5-325 MG PO TABS
1.0000 | ORAL_TABLET | ORAL | Status: DC | PRN
Start: 2021-08-01 — End: 2021-08-02

## 2021-08-01 MED ORDER — LACTATED RINGERS IV SOLN: INTRAVENOUS | Status: DC

## 2021-08-01 MED ORDER — FENTANYL-BUPIVACAINE-NACL 0.5-0.125-0.9 MG/250ML-% EP SOLN
12.0000 mL/h | EPIDURAL | Status: DC | PRN
  Administered 2021-08-01: 12 mL/h via EPIDURAL
  Filled 2021-08-01: qty 250

## 2021-08-01 MED ORDER — SOD CITRATE-CITRIC ACID 500-334 MG/5ML PO SOLN: 30.0000 mL | ORAL | Status: DC | PRN

## 2021-08-01 MED ORDER — PENICILLIN G POT IN DEXTROSE 60000 UNIT/ML IV SOLN
3.0000 10*6.[IU] | INTRAVENOUS | Status: DC
Start: 2021-08-01 — End: 2021-08-02
  Administered 2021-08-01 (×3): 3 10*6.[IU] via INTRAVENOUS
  Filled 2021-08-01 (×3): qty 50

## 2021-08-01 MED ORDER — DIPHENHYDRAMINE HCL 50 MG/ML IJ SOLN
12.5000 mg | INTRAMUSCULAR | Status: DC | PRN
Start: 2021-08-01 — End: 2021-08-02

## 2021-08-01 MED ORDER — OXYTOCIN BOLUS FROM INFUSION
333.0000 mL | Freq: Once | INTRAVENOUS | Status: AC
  Administered 2021-08-02 (×2): 333 mL via INTRAVENOUS

## 2021-08-01 MED ORDER — MISOPROSTOL 50MCG HALF TABLET
ORAL_TABLET | ORAL | Status: AC
  Administered 2021-08-01: 50 ug via BUCCAL
  Filled 2021-08-01: qty 1

## 2021-08-01 MED ORDER — LACTATED RINGERS IV SOLN: 500.0000 mL | INTRAVENOUS | Status: DC | PRN

## 2021-08-01 MED ORDER — ACETAMINOPHEN 325 MG PO TABS: 650.0000 mg | ORAL_TABLET | ORAL | Status: DC | PRN

## 2021-08-01 MED ORDER — LIDOCAINE HCL (PF) 1 % IJ SOLN: 30.0000 mL | INTRAMUSCULAR | Status: DC | PRN

## 2021-08-01 MED ORDER — SODIUM CHLORIDE 0.9% IV SOLUTION: Freq: Once | INTRAVENOUS | Status: DC

## 2021-08-01 MED ORDER — MISOPROSTOL 25 MCG QUARTER TABLET: 25.0000 ug | ORAL_TABLET | ORAL | Status: DC | PRN

## 2021-08-01 MED ORDER — HYDROXYZINE HCL 50 MG PO TABS: 50.0000 mg | ORAL_TABLET | Freq: Four times a day (QID) | ORAL | Status: DC | PRN

## 2021-08-01 MED ORDER — MISOPROSTOL 50MCG HALF TABLET
50.0000 ug | ORAL_TABLET | ORAL | Status: DC | PRN
  Filled 2021-08-01: qty 1

## 2021-08-01 MED ORDER — EPHEDRINE 5 MG/ML INJ: 10.0000 mg | INTRAVENOUS | Status: DC | PRN

## 2021-08-01 MED ORDER — PHENYLEPHRINE 40 MCG/ML (10ML) SYRINGE FOR IV PUSH (FOR BLOOD PRESSURE SUPPORT)
80.0000 ug | PREFILLED_SYRINGE | INTRAVENOUS | Status: DC | PRN
  Filled 2021-08-01: qty 10

## 2021-08-01 MED ORDER — FENTANYL CITRATE (PF) 100 MCG/2ML IJ SOLN
50.0000 ug | INTRAMUSCULAR | Status: DC | PRN
  Administered 2021-08-01 (×2): 100 ug via INTRAVENOUS
  Filled 2021-08-01 (×2): qty 2

## 2021-08-01 MED ORDER — OXYTOCIN-SODIUM CHLORIDE 30-0.9 UT/500ML-% IV SOLN: 2.5000 [IU]/h | INTRAVENOUS | Status: DC

## 2021-08-01 MED ORDER — ONDANSETRON HCL 4 MG/2ML IJ SOLN
4.0000 mg | Freq: Four times a day (QID) | INTRAMUSCULAR | Status: DC | PRN
  Administered 2021-08-02: 4 mg via INTRAVENOUS
  Filled 2021-08-01: qty 2

## 2021-08-01 MED ORDER — SODIUM CHLORIDE 0.9 % IV SOLN
5.0000 10*6.[IU] | Freq: Once | INTRAVENOUS | Status: AC
  Administered 2021-08-01: 5 10*6.[IU] via INTRAVENOUS
  Filled 2021-08-01: qty 5

## 2021-08-01 MED ORDER — LACTATED RINGERS IV SOLN
500.0000 mL | Freq: Once | INTRAVENOUS | Status: AC
  Administered 2021-08-01: 500 mL via INTRAVENOUS

## 2021-08-01 NOTE — Anesthesia Procedure Notes (Signed)
Epidural Patient location during procedure: OB Start time: 08/01/2021 6:35 PM End time: 08/01/2021 6:45 PM  Staffing Anesthesiologist: Elmer Picker, MD Performed: anesthesiologist   Preanesthetic Checklist Completed: patient identified, IV checked, risks and benefits discussed, monitors and equipment checked, pre-op evaluation and timeout performed  Epidural Patient position: sitting Prep: DuraPrep and site prepped and draped Patient monitoring: continuous pulse ox, blood pressure, heart rate and cardiac monitor Approach: midline Location: L3-L4 Injection technique: LOR air  Needle:  Needle type: Tuohy  Needle gauge: 17 G Needle length: 9 cm Needle insertion depth: 5 cm Catheter type: closed end flexible Catheter size: 19 Gauge Catheter at skin depth: 10 cm Test dose: negative  Assessment Sensory level: T8 Events: blood not aspirated, injection not painful, no injection resistance, no paresthesia and negative IV test  Additional Notes Patient identified. Risks/Benefits/Options discussed with patient including but not limited to bleeding, infection, nerve damage, paralysis, failed block, incomplete pain control, headache, blood pressure changes, nausea, vomiting, reactions to medication both or allergic, itching and postpartum back pain. Confirmed with bedside nurse the patient's most recent platelet count. Confirmed with patient that they are not currently taking any anticoagulation, have any bleeding history or any family history of bleeding disorders. Patient expressed understanding and wished to proceed. All questions were answered. Sterile technique was used throughout the entire procedure. Please see nursing notes for vital signs. Test dose was given through epidural catheter and negative prior to continuing to dose epidural or start infusion. Warning signs of high block given to the patient including shortness of breath, tingling/numbness in hands, complete motor block,  or any concerning symptoms with instructions to call for help. Patient was given instructions on fall risk and not to get out of bed. All questions and concerns addressed with instructions to call with any issues or inadequate analgesia.  Reason for block:procedure for pain

## 2021-08-01 NOTE — Anesthesia Preprocedure Evaluation (Signed)
Anesthesia Evaluation  Patient identified by MRN, date of birth, ID band Patient awake    Reviewed: Allergy & Precautions, NPO status , Patient's Chart, lab work & pertinent test results  Airway Mallampati: III  TM Distance: >3 FB Neck ROM: Full    Dental no notable dental hx.    Pulmonary neg pulmonary ROS,    Pulmonary exam normal breath sounds clear to auscultation       Cardiovascular negative cardio ROS Normal cardiovascular exam Rhythm:Regular Rate:Normal     Neuro/Psych negative neurological ROS  negative psych ROS   GI/Hepatic Neg liver ROS, GERD  ,  Endo/Other  negative endocrine ROS  Renal/GU negative Renal ROS  negative genitourinary   Musculoskeletal negative musculoskeletal ROS (+)   Abdominal   Peds  Hematology  (+) Blood dyscrasia (Hgb 9.5, plt 133), anemia ,   Anesthesia Other Findings IOL for polyhydraminos and LGA  Reproductive/Obstetrics (+) Pregnancy                             Anesthesia Physical Anesthesia Plan  ASA: 2  Anesthesia Plan: Epidural   Post-op Pain Management:    Induction:   PONV Risk Score and Plan: Treatment may vary due to age or medical condition  Airway Management Planned: Natural Airway  Additional Equipment:   Intra-op Plan:   Post-operative Plan:   Informed Consent: I have reviewed the patients History and Physical, chart, labs and discussed the procedure including the risks, benefits and alternatives for the proposed anesthesia with the patient or authorized representative who has indicated his/her understanding and acceptance.       Plan Discussed with: Anesthesiologist  Anesthesia Plan Comments: (Patient identified. Risks, benefits, options discussed with patient including but not limited to bleeding, infection, nerve damage, paralysis, failed block, incomplete pain control, headache, blood pressure changes, nausea,  vomiting, reactions to medication, itching, and post partum back pain. Confirmed with bedside nurse the patient's most recent platelet count. Confirmed with the patient that they are not taking any anticoagulation, have any bleeding history or any family history of bleeding disorders. Patient expressed understanding and wishes to proceed. All questions were answered. )        Anesthesia Quick Evaluation

## 2021-08-01 NOTE — Progress Notes (Signed)
Labor Progress Note Rmani Kapusta is a 35 y.o. U1J0315 at [redacted]w[redacted]d presented for IOL d/t poly and LGA.   S: Feeling contractions frequently.   O:  BP (!) 94/48   Pulse 62   Temp 98.2 F (36.8 C) (Oral)   Resp 20   Ht 5' (1.524 m)   Wt 87.2 kg   LMP 09/24/2020   SpO2 100%   Breastfeeding Unknown   BMI 37.54 kg/m  EFM: 140/mod/15x15/none  Contractions every 2 min   CVE: Dilation: 7.5 Effacement (%): 90 Cervical Position: Posterior Station: -1 Exam by:: Boone Master, RN and Salley Hews, RN   A&P: 35 y.o. [phone] [redacted]w[redacted]d  #Labor: Progressing well, cont pit as is. Will monitor, anticipate delivery soon.  #Pain: Epidural just placed  #FWB: Cat 1 #GBS positive PCN  #LGA: >99th percentile. Tested pelvis, will prepare for shoulder dystocia.   Allayne Stack, DO 7:31 PM

## 2021-08-01 NOTE — Progress Notes (Signed)
Labor Progress Note Amy Lloyd is a 35 y.o. G3O7564 at [redacted]w[redacted]d who presented for IOL d/t poly and LGA.   S: Sitting up in bed. Feeling nauseated. Having more pressure than before.  O:  BP 130/80   Pulse 65   Temp 98.5 F (36.9 C) (Oral)   Resp 16   Ht 5' (1.524 m)   Wt 87.2 kg   LMP 09/24/2020   SpO2 100%   Breastfeeding Unknown   BMI 37.54 kg/m   EFM: 135, mod, +accels, intermittent variable decels  Contractions every 1-2 minutes  Dilation: 10 Dilation Complete Date: 08/01/21 Dilation Complete Time: 2056 Effacement (%): 100 Cervical Position: Posterior Station: Plus 1 Presentation: Vertex Exam by:: Dr. Mathis Fare   A&P: 36 y.o. [id number] [redacted]w[redacted]d   #Labor: Improvement in fetal station since last check with throne positioning. Feeling more pressure but not having urge to push at this time. Pitocin stopped due to contractions every 1-2 minutes. Will reassess in 1 hour or when patient starts feeling more pressure. #Pain: Epidural #FWB: Cat 2 tracing due to intermittent variable decels. Continues to have good variability and accels.  #GBS positive; PCN  #LGA: >99th percentile. Tested pelvis, will prepare for shoulder dystocia.   Worthy Rancher, MD OB Fellow  Faculty Practice 11:49 PM

## 2021-08-01 NOTE — H&P (Signed)
OBSTETRIC ADMISSION HISTORY AND PHYSICAL  Amy Lloyd is a 35 y.o. female [phone] with IUP at [redacted]w[redacted]d by [place] presenting for IOL due to polyhydramnios. She reports +FMs, No LOF, no VB, no blurry vision, headaches or peripheral edema, and RUQ pain.  She plans on breast feeding. She request IUD for birth control. She received her prenatal care at  [facility].    Dating: By 8 wk U/S --->  Estimated Date of Delivery: 08/08/21  Sono:    @[redacted]w[redacted]d , CWD, normal anatomy, cephalic presentation, EFW below    Prenatal History/Complications:  --Mild Polyhydramnios (AFI 24.3)  --LGA/Macrosomia (EFW 4266g, 9lbs 6 oz, >99th%)   Past Medical History: Past Medical History:  Diagnosis Date   Gallbladder attack    GERD (gastroesophageal reflux disease)     Past Surgical History: Past Surgical History:  Procedure Laterality Date   APPENDECTOMY  09/26/2015   "lap"   CHOLECYSTECTOMY N/A 11/12/2017   Procedure: LAPAROSCOPIC CHOLECYSTECTOMY;  Surgeon: 11/14/2017, MD;  Location: [facility] OR;  Service: General;  Laterality: N/A;   LAPAROSCOPIC APPENDECTOMY N/A 09/26/2015   Procedure: APPENDECTOMY LAPAROSCOPIC;  Surgeon: 11/26/2015, MD;  Location: [facility] OR;  Service: General;  Laterality: N/A;    Obstetrical History: OB History     Gravida  4   Para  3   Term  3   Preterm      AB      Living  3      SAB      IAB      Ectopic      Multiple  0   Live Births  3           Social History Social History   Socioeconomic History   Marital status: Significant Other    Spouse name: Not on file   Number of children: Not on file   Years of education: Not on file   Highest education level: Not on file  Occupational History   Not on file  Tobacco Use   Smoking status: Never   Smokeless tobacco: Never  Vaping Use   Vaping Use: Never used  Substance and Sexual Activity   Alcohol use: No   Drug use: No   Sexual activity: Yes    Birth control/protection: None    Comment:  10 July 20  Other Topics Concern   Not on file  Social History Narrative   Not on file   Social Determinants of Health   Financial Resource Strain: Not on file  Food Insecurity: Not on file  Transportation Needs: Not on file  Physical Activity: Not on file  Stress: Not on file  Social Connections: Not on file    Family History: History reviewed. No pertinent family history.  Allergies: No Known Allergies  Medications Prior to Admission  Medication Sig Dispense Refill Last Dose   acetaminophen (TYLENOL) 325 MG tablet Take 2 tablets (650 mg total) by mouth every 6 (six) hours as needed (for pain scale < 4). 90 tablet 0    aspirin EC 81 MG tablet Take 1 tablet (81 mg total) by mouth daily. Swallow whole. 30 tablet 11    famotidine (PEPCID) 20 MG tablet Take 1 tablet (20 mg total) by mouth 2 (two) times daily. 90 tablet 1    ferrous sulfate (FERROUSUL) 325 (65 FE) MG tablet Take 1 tablet (325 mg total) by mouth 2 (two) times daily. 60 tablet 1    ondansetron (ZOFRAN ODT) 4 MG disintegrating tablet Take 1 tablet (4  mg total) by mouth every 8 (eight) hours as needed for nausea or vomiting. (Patient not taking: Reported on 07/31/2021) 1 tablet 0    prenatal vitamin w/FE, FA (PRENATAL 1 + 1) 27-1 MG TABS tablet Take 1 tablet by mouth daily at 12 noon.      pyridOXINE (VITAMIN B-6) 50 MG tablet Take 1 tablet (50 mg total) by mouth daily. 30 tablet 0      Review of Systems   All systems reviewed and negative except as stated in HPI  Blood pressure 110/77, pulse (!) 112, temperature 98.2 F (36.8 C), temperature source Oral, resp. rate 18, last menstrual period 09/24/2020, unknown if currently breastfeeding. General appearance: alert, cooperative, and no distress Lungs: clear to auscultation bilaterally Heart: regular rate and rhythm Abdomen: soft, non-tender; bowel sounds normal Extremities: Homans sign is negative, no sign of DVT Presentation: cephalic Fetal monitoringBaseline:  130 bpm, Variability: Good {> 6 bpm), Accelerations: Reactive, and Decelerations: Absent Uterine activityNone Exam by:: Dr. Annia Friendly   Prenatal labs: ABO, Rh: --/--/PENDING (08/16 0900) Antibody: PENDING (08/16 0900) Rubella: 12.60 (03/30 1014) RPR: Non Reactive (06/02 1202)  HBsAg: Negative (03/30 1014)  HIV: Non Reactive (06/02 1202)  GBS: Positive/-- (07/25 1511)  1 hr Glucola normal  Genetic screening  declined  Anatomy US normal   Prenatal Transfer Tool  Maternal Diabetes: No Genetic Screening: Declined Maternal Ultrasounds/Referrals: Normal Fetal Ultrasounds or other Referrals:  None Maternal Substance Abuse:  No Significant Maternal Medications:  None Significant Maternal Lab Results: Group B Strep positive  Results for orders placed or performed during the hospital encounter of 08/01/21 (from the past 24 hour(s))  Type and screen   Collection Time: 08/01/21  9:00 AM  Result Value Ref Range   ABO/RH(D) PENDING    Antibody Screen PENDING    Sample Expiration      08/04/2021,2359 Performed at [facility], [address] [place], [place] [address]   CBC   Collection Time: 08/01/21  9:20 AM  Result Value Ref Range   WBC 6.1 4.0 - 10.5 K/uL   RBC 3.90 3.87 - 5.11 MIL/uL   Hemoglobin 9.5 (L) 12.0 - 15.0 g/dL   HCT 93.5 (L) 70.1 - 77.9 %   MCV 77.2 (L) 80.0 - 100.0 fL   MCH 24.4 (L) 26.0 - 34.0 pg   MCHC 31.6 30.0 - 36.0 g/dL   RDW 39.0 30.0 - 92.3 %   Platelets 133 (L) 150 - 400 K/uL   nRBC 0.0 0.0 - 0.2 %    Patient Active Problem List   Diagnosis Date Noted   Polyhydramnios affecting pregnancy in third trimester 08/01/2021   [redacted] weeks gestation of pregnancy 07/19/2021   GBS carrier 07/12/2021   Polyhydramnios affecting pregnancy 06/23/2021   Breech presentation, antepartum 05/18/2021   Round ligament pain 05/18/2021   Supervision of other normal pregnancy, antepartum 05/18/2021   Gastroesophageal reflux during pregnancy in second trimester,  antepartum 04/20/2021    Assessment/Plan:  Amy Lloyd is a 35 y.o. G4P3003 at [redacted]w[redacted]d here for IOL due to mild polyhydramnios and LGA.   #Labor: Will begin with cytotec, previously quick progression with her deliveries, may anticipate the same.  #Pain: Epidural when needed #FWB: Cat 1  #ID: GBS pos, PCN #MOF: Breastfeeding  #MOC: IUD  #Circ: N/A   #LGA: EFW 4266g, 9lbs 6 oz, >99th%. Pelvis tested to 3860g. No GDM during pregnancy. Will prepare for shoulder dystocia.     Allayne Stack, DO  08/01/2021, 10:49  AM

## 2021-08-01 NOTE — Progress Notes (Signed)
Labor Progress Note Amy Lloyd is a 35 y.o. G3T5176 at [redacted]w[redacted]d presented for IOL d/t poly and LGA.   S: Resting comfortably. No concerns at this time.  O:  BP (!) 90/53   Pulse 64   Temp 98.2 F (36.8 C) (Oral)   Resp 16   Ht 5' (1.524 m)   Wt 87.2 kg   LMP 09/24/2020   SpO2 100%   Breastfeeding Unknown   BMI 37.54 kg/m  EFM: 135/mod/+accels/-decels Contractions every 2 minutes  CVE: Dilation: 10 Dilation Complete Date: 08/01/21 Dilation Complete Time: 2056 Effacement (%): 100 Station: -1 Presentation: Vertex Exam by:: Dr.Patrese Neal   A&P: 35 y.o. [id number] [redacted]w[redacted]d  #Labor: SVE 10/100/-1. AROM at 2057 performed with clear fluid. Mother and baby tolerated procedure well. Anticipate SVD shortly. #Pain: Epidural #FWB: Cat 1 #GBS positive; PCN  #LGA: >99th percentile. Tested pelvis, will prepare for shoulder dystocia.   Worthy Rancher, MD OB Fellow  Faculty Practice 9:09 PM

## 2021-08-02 ENCOUNTER — Encounter (HOSPITAL_COMMUNITY): Payer: Self-pay | Admitting: Obstetrics and Gynecology

## 2021-08-02 DIAGNOSIS — O3663X0 Maternal care for excessive fetal growth, third trimester, not applicable or unspecified: Secondary | ICD-10-CM

## 2021-08-02 DIAGNOSIS — O3660X0 Maternal care for excessive fetal growth, unspecified trimester, not applicable or unspecified: Secondary | ICD-10-CM

## 2021-08-02 DIAGNOSIS — O99824 Streptococcus B carrier state complicating childbirth: Secondary | ICD-10-CM

## 2021-08-02 DIAGNOSIS — O403XX0 Polyhydramnios, third trimester, not applicable or unspecified: Secondary | ICD-10-CM

## 2021-08-02 DIAGNOSIS — Z3A39 39 weeks gestation of pregnancy: Secondary | ICD-10-CM

## 2021-08-02 LAB — CBC
HCT: 30.6 % — ABNORMAL LOW (ref 36.0–46.0)
Hemoglobin: 9.4 g/dL — ABNORMAL LOW (ref 12.0–15.0)
MCH: 23.9 pg — ABNORMAL LOW (ref 26.0–34.0)
MCHC: 30.7 g/dL (ref 30.0–36.0)
MCV: 77.9 fL — ABNORMAL LOW (ref 80.0–100.0)
Platelets: 122 10*3/uL — ABNORMAL LOW (ref 150–400)
RBC: 3.93 MIL/uL (ref 3.87–5.11)
RDW: 15.3 % (ref 11.5–15.5)
WBC: 11.1 10*3/uL — ABNORMAL HIGH (ref 4.0–10.5)
nRBC: 0 % (ref 0.0–0.2)

## 2021-08-02 LAB — RPR: RPR Ser Ql: NONREACTIVE

## 2021-08-02 MED ORDER — SENNOSIDES-DOCUSATE SODIUM 8.6-50 MG PO TABS: 2.0000 | ORAL_TABLET | Freq: Every day | ORAL | Status: DC

## 2021-08-02 MED ORDER — DIBUCAINE (PERIANAL) 1 % EX OINT: 1.0000 "application " | TOPICAL_OINTMENT | CUTANEOUS | Status: DC | PRN

## 2021-08-02 MED ORDER — MEASLES, MUMPS & RUBELLA VAC IJ SOLR: 0.5000 mL | Freq: Once | INTRAMUSCULAR | Status: DC

## 2021-08-02 MED ORDER — SIMETHICONE 80 MG PO CHEW: 80.0000 mg | CHEWABLE_TABLET | ORAL | Status: DC | PRN

## 2021-08-02 MED ORDER — IBUPROFEN 600 MG PO TABS
600.0000 mg | ORAL_TABLET | Freq: Four times a day (QID) | ORAL | Status: DC
  Administered 2021-08-02 – 2021-08-03 (×5): 600 mg via ORAL
  Filled 2021-08-02 (×5): qty 1

## 2021-08-02 MED ORDER — ONDANSETRON HCL 4 MG PO TABS: 4.0000 mg | ORAL_TABLET | ORAL | Status: DC | PRN

## 2021-08-02 MED ORDER — PRENATAL MULTIVITAMIN CH
1.0000 | ORAL_TABLET | Freq: Every day | ORAL | Status: DC
  Administered 2021-08-02: 1 via ORAL
  Filled 2021-08-02: qty 1

## 2021-08-02 MED ORDER — DIPHENHYDRAMINE HCL 25 MG PO CAPS: 25.0000 mg | ORAL_CAPSULE | Freq: Four times a day (QID) | ORAL | Status: DC | PRN

## 2021-08-02 MED ORDER — TETANUS-DIPHTH-ACELL PERTUSSIS 5-2.5-18.5 LF-MCG/0.5 IM SUSY: 0.5000 mL | PREFILLED_SYRINGE | Freq: Once | INTRAMUSCULAR | Status: DC

## 2021-08-02 MED ORDER — BENZOCAINE-MENTHOL 20-0.5 % EX AERO: 1.0000 "application " | INHALATION_SPRAY | CUTANEOUS | Status: DC | PRN

## 2021-08-02 MED ORDER — COCONUT OIL OIL: 1.0000 "application " | TOPICAL_OIL | Status: DC | PRN

## 2021-08-02 MED ORDER — ACETAMINOPHEN 325 MG PO TABS: 650.0000 mg | ORAL_TABLET | ORAL | Status: DC | PRN

## 2021-08-02 MED ORDER — ONDANSETRON HCL 4 MG/2ML IJ SOLN: 4.0000 mg | INTRAMUSCULAR | Status: DC | PRN

## 2021-08-02 MED ORDER — WITCH HAZEL-GLYCERIN EX PADS: 1.0000 "application " | MEDICATED_PAD | CUTANEOUS | Status: DC | PRN

## 2021-08-02 MED ORDER — MEDROXYPROGESTERONE ACETATE 150 MG/ML IM SUSP: 150.0000 mg | INTRAMUSCULAR | Status: DC | PRN

## 2021-08-02 NOTE — Lactation Note (Signed)
This note was copied from a baby's chart. Lactation Consultation Note Attempted to see mom in L&D. Unable to d/t they are still working on mom.  Patient Name: Amy Lloyd HYHOO'I Date: 08/02/2021   Age:34 hours  Maternal Data    Feeding    LATCH Score                    Lactation Tools Discussed/Used    Interventions    Discharge    Consult Status      Charyl Dancer 08/02/2021, 2:21 AM

## 2021-08-02 NOTE — Anesthesia Postprocedure Evaluation (Signed)
Anesthesia Post Note  Patient: Amy Lloyd  Procedure(s) Performed: AN AD HOC LABOR EPIDURAL     Patient location during evaluation: Mother Baby Anesthesia Type: Epidural Level of consciousness: awake and alert Pain management: pain level controlled Vital Signs Assessment: post-procedure vital signs reviewed and stable Respiratory status: spontaneous breathing, nonlabored ventilation and respiratory function stable Cardiovascular status: stable Postop Assessment: no headache, no backache, epidural receding, no apparent nausea or vomiting, patient able to bend at knees, adequate PO intake and able to ambulate Anesthetic complications: no   No notable events documented.  Last Vitals:  Vitals:   08/02/21 0929 08/02/21 1241  BP: 124/70 126/62  Pulse: (!) 53 (!) 56  Resp: 16 16  Temp: 36.8 C 36.7 C  SpO2: 100% 100%    Last Pain:  Vitals:   08/02/21 1241  TempSrc: Oral  PainSc: 0-No pain   Pain Goal:                Epidural/Spinal Function Cutaneous sensation: Normal sensation (08/02/21 1241), Patient able to flex knees: Yes (08/02/21 1241), Patient able to lift hips off bed: Yes (08/02/21 1241), Back pain beyond tenderness at insertion site: No (08/02/21 1241), Progressively worsening motor and/or sensory loss: No (08/02/21 1241), Bowel and/or bladder incontinence post epidural: No (08/02/21 1241)  Earley Grobe Hristova

## 2021-08-02 NOTE — Progress Notes (Signed)
Patient now 10/100/+2. Trial of practice pushes with good fetal movement. Will continue pushing.   Evalina Field, MD  OB Fellow  Faculty Practice

## 2021-08-02 NOTE — Lactation Note (Addendum)
This note was copied from a baby's chart. Lactation Consultation Note Used interpreter Luther Parody [phone]. Mom's 4th child. Mom only really BF her 3rd child now 34 yrs old for 6 months. Mom BF/formula fed that child. Asked mom if the baby was loosing wt. That's why she felt she didn't have enough BM for the baby or did the Dr. Jamelle Rushing mom to supplement. Mom stated no she just felt like she didn't have enough because the baby wanted to eat a lot.  Baby unable to latch well in cradle position. Placed pillows for football hold. Baby in sitting upright position. Got baby to latch a few times really good then baby would clamp and tongue thrust and suckle on top lip. LC doesn't feel that the baby has good latch or will be able to transfer anything w/o assistance. LC will set up tools in rm. At f/u on MBU.  Patient Name: Amy Lloyd [id number] Date: 08/02/2021 Reason for consult: L&D Initial assessment;Term Age:81 hours  Maternal Data Does the patient have breastfeeding experience prior to this delivery?: Yes How long did the patient breastfeed?: 6 months/BF/Formula  Feeding    LATCH Score Latch: Repeated attempts needed to sustain latch, nipple held in mouth throughout feeding, stimulation needed to elicit sucking reflex.  Audible Swallowing: None  Type of Nipple: Flat (semi flat/very short shaft)  Comfort (Breast/Nipple): Soft / non-tender  Hold (Positioning): Assistance needed to correctly position infant at breast and maintain latch.  LATCH Score: 5   Lactation Tools Discussed/Used    Interventions Interventions: Breast feeding basics reviewed;Support pillows;Assisted with latch;Position options;Skin to skin;Breast compression;Adjust position  Discharge    Consult Status Consult Status: Follow-up from L&D    Dow Blahnik, Diamond Nickel 08/02/2021, 2:47 AM

## 2021-08-02 NOTE — Anesthesia Postprocedure Evaluation (Signed)
Anesthesia Post Note  Patient: Amy Lloyd  Procedure(s) Performed: AN AD HOC LABOR EPIDURAL     Anesthesia Post Evaluation No notable events documented.  Last Vitals:  Vitals:   08/02/21 1241 08/02/21 1722  BP: 126/62 128/62  Pulse: (!) 56 62  Resp: 16 16  Temp: 36.7 C 36.8 C  SpO2: 100% 100%    Last Pain:  Vitals:   08/02/21 1722  TempSrc: Oral  PainSc: 0-No pain   Pain Goal:                Epidural/Spinal Function Cutaneous sensation: Normal sensation (08/02/21 1722), Patient able to flex knees: Yes (08/02/21 1722), Patient able to lift hips off bed: Yes (08/02/21 1722), Back pain beyond tenderness at insertion site: No (08/02/21 1722), Progressively worsening motor and/or sensory loss: No (08/02/21 1722), Bowel and/or bladder incontinence post epidural: No (08/02/21 1722)  Cephus Shelling

## 2021-08-02 NOTE — Discharge Summary (Signed)
Postpartum Discharge Summary     Patient Name: Amy Lloyd DOB: 11-Sep-1986 MRN: [id number]  Date of admission: 08/01/2021 Delivery date:08/02/2021  Delivering provider: Genia Del  Date of discharge: 08/03/2021  Admitting diagnosis: Polyhydramnios affecting pregnancy in third trimester [O40.3XX0] Intrauterine pregnancy: [redacted]w[redacted]d    Secondary diagnosis:  Principal Problem:   Vaginal delivery Active Problems:   Polyhydramnios affecting pregnancy in third trimester   LGA (large for gestational age) fetus affecting management of mother  Additional problems: None    Discharge diagnosis: Term Pregnancy Delivered                                              Post partum procedures: None Augmentation: AROM, Pitocin, and Cytotec Complications: None  Hospital course: Induction of Labor With Vaginal Delivery   35y.o. yo [id number] admitted to the hospital 08/01/2021 for induction of labor.  Indication for induction:  polyhydramnios and LGA .  Patient had an uncomplicated labor course as follows: Membrane Rupture Time/Date: 8:57 PM ,08/01/2021   Delivery Method:Vaginal, Spontaneous  Episiotomy: None  Lacerations:  1st degree  Details of delivery can be found in separate delivery note.  Patient had a routine postpartum course. She is eating and drinking well, ambulating, passing flatus, and voiding without issue. Patient is discharged home 08/03/21.  Newborn Data: Birth date:08/02/2021  Birth time:1:32 AM  Gender:Female  Living status:Living  Apgars:9 ,9  Weight:3824 g   Magnesium Sulfate received: No BMZ received: No Rhophylac:N/A MMR:N/A - Immune T-DaP: Given prenatally Flu: N/A Transfusion:No  Physical exam  Vitals:   08/02/21 1241 08/02/21 1722 08/02/21 2045 08/03/21 0500  BP: 126/62 128/62 111/72 102/71  Pulse: (!) 56 62 76 64  Resp: _0 Temp: 98 F (36.7 C) 98.2 F (36.8 C) 98.1 F (36.7 C) 98.1 F (36.7 C)  TempSrc: Oral Oral  Oral Oral  SpO2: 100% 100% 98% 100%  Weight:      Height:       General: alert, cooperative, and no distress Lochia: appropriate Uterine Fundus: firm DVT Evaluation: no LE edema, no calf tenderness to palpation   Labs: Lab Results  Component Value Date   WBC 11.1 (H) 08/02/2021   HGB 9.4 (L) 08/02/2021   HCT 30.6 (L) 08/02/2021   MCV 77.9 (L) 08/02/2021   PLT 122 (L) 08/02/2021   CMP Latest Ref Rng & Units 06/12/2021  Glucose 70 - 99 mg/dL 80  BUN 6 - 20 mg/dL 6  Creatinine 0.44 - 1.00 mg/dL 0.53  Sodium 135 - 145 mmol/L 135  Potassium 3.5 - 5.1 mmol/L 3.4(L)  Chloride 98 - 111 mmol/L 105  CO2 22 - 32 mmol/L 19(L)  Calcium 8.9 - 10.3 mg/dL 8.6(L)  Total Protein 6.5 - 8.1 g/dL 5.8(L)  Total Bilirubin 0.3 - 1.2 mg/dL 0.8  Alkaline Phos 38 - 126 U/L 114  AST 15 - 41 U/L 21  ALT 0 - 44 U/L 22   Edinburgh Score: Edinburgh Postnatal Depression Scale Screening Tool 08/02/2021  I have been able to laugh and see the funny side of things. 0  I have looked forward with enjoyment to things. 0  I have blamed myself unnecessarily when things went wrong. 0  I have been anxious or worried for no good reason. 1  I have felt scared or panicky for  no good reason. 0  Things have been getting on top of me. 0  I have been so unhappy that I have had difficulty sleeping. 0  I have felt sad or miserable. 0  I have been so unhappy that I have been crying. 0  The thought of harming myself has occurred to me. 0  Edinburgh Postnatal Depression Scale Total 1     After visit meds:  Allergies as of 08/03/2021   No Known Allergies      Medication List     STOP taking these medications    aspirin EC 81 MG tablet   famotidine 20 MG tablet Commonly known as: PEPCID   ondansetron 4 MG disintegrating tablet Commonly known as: Zofran ODT   pyridOXINE 50 MG tablet Commonly known as: VITAMIN B-6       TAKE these medications    acetaminophen 325 MG tablet Commonly known as:  Tylenol Take 2 tablets (650 mg total) by mouth every 6 (six) hours as needed (pain). What changed: reasons to take this   ferrous sulfate 325 (65 FE) MG tablet Commonly known as: FerrouSul Take 1 tablet (325 mg total) by mouth every other day. What changed: when to take this   ibuprofen 200 MG tablet Commonly known as: ADVIL Take 2 tablets (400 mg total) by mouth every 6 (six) hours as needed (pain).   prenatal vitamin w/FE, FA 27-1 MG Tabs tablet Take 1 tablet by mouth daily at 12 noon.         Discharge home in stable condition Infant Feeding: Breast Infant Disposition: home with mother Discharge instruction: per After Visit Summary and Postpartum booklet. Activity: Advance as tolerated. Pelvic rest for 6 weeks.  Diet: routine diet Future Appointments:No future appointments. Follow up Visit: Message sent by Dr. Gwenlyn Perking on 8/17.  Please schedule this patient for a In person postpartum visit in 4 weeks with the following provider: Any provider. Additional Postpartum F/U: N/A   High risk pregnancy complicated by:  Polyhydramnios and LGA Delivery mode:  Vaginal, Spontaneous  Anticipated Birth Control:  IUD outpatient   Vilma Meckel, MD OB Fellow, [place] [facility] [place] 08/03/2021 10:48 AM

## 2021-08-03 MED ORDER — ACETAMINOPHEN 325 MG PO TABS: 650.0000 mg | ORAL_TABLET | Freq: Four times a day (QID) | ORAL | 0 refills | Status: AC | PRN

## 2021-08-03 MED ORDER — FERROUS SULFATE 325 (65 FE) MG PO TABS: 325.0000 mg | ORAL_TABLET | ORAL | 1 refills | Status: AC

## 2021-08-03 MED ORDER — IBUPROFEN 200 MG PO TABS: 400.0000 mg | ORAL_TABLET | Freq: Four times a day (QID) | ORAL | Status: DC | PRN

## 2021-08-03 NOTE — Lactation Note (Signed)
This note was copied from a baby's chart. Lactation Consultation Note  Patient Name: Amy Lloyd [id number] Date: 08/03/2021 Reason for consult: Follow-up assessment Age:34 hours  Video interpreter used for Spanish.  P4, Mother denies questions or concerns. Feed on demand with cues.  Goal 8-12+ times per day after first 24 hrs.  Place baby STS if not cueing.  Reviewed engorgement care and monitoring voids/stools. Discussed supply and demand.   Feeding Mother's Current Feeding Choice: Breast Milk and Formula  Interventions Interventions: Breast feeding basics reviewed;Education  Discharge Discharge Education: Engorgement and breast care;Warning signs for feeding baby  Consult Status Consult Status: Complete Date: 08/03/21    [name] [facility] 08/03/2021, 11:58 AM

## 2021-08-04 LAB — SURGICAL PATHOLOGY

## 2021-08-05 LAB — TYPE AND SCREEN
ABO/RH(D): O POS
Antibody Screen: NEGATIVE
Unit division: 0
Unit division: 0

## 2021-08-05 LAB — BPAM RBC
Blood Product Expiration Date: 202209192359
Blood Product Expiration Date: 202209202359
ISSUE DATE / TIME: 202208161539
Unit Type and Rh: 5100
Unit Type and Rh: 5100

## 2021-08-14 ENCOUNTER — Telehealth (HOSPITAL_COMMUNITY): Payer: Self-pay | Admitting: *Deleted

## 2021-08-14 NOTE — Telephone Encounter (Signed)
Interpreter reports mom is doing well. No concerns about herself. EPDS = 0 (Hospital score = 1) Interpreter reports baby is doing well. Feeding, peeing, and pooping without difficulty. Sleeps in crib on her back per mom. No concerns about baby.  Duffy Rhody, RN 08-14-2021 at 11:34am

## 2021-09-15 NOTE — Progress Notes (Signed)
Spanish interpreter De Nurse [phone] used for entirety of visit.   Amy Lloyd is a 35 y.o. [id number] female who presents for a postpartum visit. She is 6 weeks postpartum following a normal spontaneous vaginal delivery.  I have fully reviewed the prenatal and intrapartum course. The delivery was at [redacted]w[redacted]d gestational weeks.  Anesthesia: epidural. Postpartum course has been good. Baby is doing well. Baby is feeding by both breast and bottle - Gerber gentle . Bleeding no bleeding and bleeding only lasted 2 weeks . Bowel function is normal. Bladder function is normal. Patient is not sexually active. Contraception method is  interested in copper IUD . Postpartum depression screening: negative.   The pregnancy intention screening data noted above was reviewed. Potential methods of contraception were discussed. The patient elected to proceed with Mirena IUD.  Health Maintenance Due  Topic Date Due   INFLUENZA VACCINE  07/17/2021    The following portions of the patient's history were reviewed and updated as appropriate: allergies, current medications, past family history, past medical history, past social history, past surgical history, and problem list.  Review of Systems Denies chest pain, SOB.  No leg swelling. Sometimes has headache and ear pain.  Intermittent abdominal pain.   Objective:  BP 110/60   Pulse 61   Ht 5' (1.524 m)   Wt 168 lb 6.4 oz (76.4 kg)   LMP 09/24/2020   SpO2 99%   BMI 32.89 kg/m    General:  alert, cooperative, appears stated age, and no distress   Breasts:  not indicated  Lungs: clear to auscultation bilaterally  Heart:  regular rate and rhythm, S1, S2 normal, no murmur, click, rub or gallop  Abdomen: Soft, mild generalized tenderness without rebound or guarding    Wound N/A  GU exam:  not indicated       Assessment:    There are no diagnoses linked to this encounter.  Normal postpartum exam.   Plan:   Essential components of care per ACOG  recommendations:  1.  Mood and well being: Patient with negative depression screening today. Reviewed local resources for support.  - Patient tobacco use? No.   - hx of drug use? No.    2. Infant care and feeding:  -Patient currently breastmilk feeding? Yes. Discussed returning to work and pumping.  -Social determinants of health (SDOH) reviewed in EPIC. No concerns.   3. Sexuality, contraception and birth spacing - Patient does not want a pregnancy in the next year.  Desired family size is 4 children.  - Reviewed forms of contraception in tiered fashion. Patient desired IUD today. Will need to be placed at health department.  - Discussed birth spacing of 18 months  4. Sleep and fatigue -Encouraged family/partner/community support of 4 hrs of uninterrupted sleep to help with mood and fatigue  5. Physical Recovery  - Discussed patients delivery and complications. She describes her labor as good. - Patient had a Vaginal, no problems at delivery. Patient had a 1st degree laceration that was hemostatic and not repaired. Perineal healing reviewed. Patient expressed understanding - Patient has urinary incontinence? No. - Patient is safe to resume physical and sexual activity  6.  Health Maintenance - HM due items addressed No - Unsinsured, should get Flu and IUD at HD - Last pap smear  Diagnosis  Date Value Ref Range Status  03/22/2021   Final   - Negative for intraepithelial lesion or malignancy (NILM)  -Breast Cancer screening indicated? No.   7. Chronic Disease/Pregnancy  Condition follow up: None   Sabino Dick PGY-2 Family Medicine

## 2021-09-18 NOTE — Patient Instructions (Addendum)
Fue maravilloso verte hoy.  Por favor traiga TODOS sus medicamentos a cada visita.  Hoy hablamos de:  Me alegra saber que usted y su beb estn bien! Te animo a que sigas amamantando. Debe usar proteccin si tiene Clinical research associate. Puede ir al [facility] para su vacuna contra la gripe y para el DIU.  Karl Pock por elegir Medicina familiar de Ali Molina.  Llame al [phone] si tiene alguna pregunta sobre la cita de [place].  Asegrese de programar un seguimiento en la recepcin antes de irse hoy.  Sabino Dick, D.O. PGY-2 Medicina Familiar

## 2021-09-19 ENCOUNTER — Other Ambulatory Visit: Payer: Self-pay

## 2021-09-19 ENCOUNTER — Ambulatory Visit (INDEPENDENT_AMBULATORY_CARE_PROVIDER_SITE_OTHER): Payer: Self-pay | Admitting: Family Medicine

## 2021-09-19 ENCOUNTER — Encounter: Payer: Self-pay | Admitting: Family Medicine

## 2023-08-05 ENCOUNTER — Emergency Department
Admission: EM | Admit: 2023-08-05 | Discharge: 2023-08-05 | Disposition: A | Discharge status: Home / Self Care | Payer: Self-pay | Attending: Emergency Medicine | Admitting: Emergency Medicine

## 2023-08-05 ENCOUNTER — Emergency Department: Payer: Self-pay

## 2023-08-05 ENCOUNTER — Other Ambulatory Visit: Payer: Self-pay

## 2023-08-05 DIAGNOSIS — Y92410 Unspecified street and highway as the place of occurrence of the external cause: Secondary | ICD-10-CM | POA: Diagnosis not present

## 2023-08-05 DIAGNOSIS — M545 Low back pain, unspecified: Secondary | ICD-10-CM | POA: Diagnosis not present

## 2023-08-05 DIAGNOSIS — M25512 Pain in left shoulder: Secondary | ICD-10-CM | POA: Diagnosis not present

## 2023-08-05 MED ORDER — METHOCARBAMOL 500 MG PO TABS
500.0000 mg | ORAL_TABLET | Freq: Three times a day (TID) | ORAL | 0 refills | Status: AC | PRN
  Filled 2023-08-05: qty 15, 5d supply, fill #0

## 2023-08-05 MED ORDER — MELOXICAM 15 MG PO TABS
15.0000 mg | ORAL_TABLET | Freq: Every day | ORAL | 1 refills | Status: AC
  Filled 2023-08-05: qty 30, 30d supply, fill #0

## 2023-08-05 NOTE — Discharge Instructions (Signed)
Take Meloxicam and Robaxin as directed.  
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
## 2023-08-06 ENCOUNTER — Other Ambulatory Visit: Payer: Self-pay

## 2023-08-13 ENCOUNTER — Other Ambulatory Visit: Payer: Self-pay

## 2023-08-14 ENCOUNTER — Encounter: Payer: Medicaid Other | Admitting: Nurse Practitioner

## 2023-09-13 ENCOUNTER — Encounter: Payer: Medicaid Other | Admitting: Nurse Practitioner

## 2023-10-09 ENCOUNTER — Encounter: Payer: Medicaid Other | Admitting: Nurse Practitioner

## 2023-10-14 ENCOUNTER — Encounter: Payer: Medicaid Other | Admitting: Nurse Practitioner

## 2023-11-01 ENCOUNTER — Other Ambulatory Visit (HOSPITAL_COMMUNITY)
Admission: RE | Admit: 2023-11-01 | Discharge: 2023-11-01 | Disposition: A | Discharge status: Home / Self Care | Payer: Self-pay | Source: Ambulatory Visit | Attending: Nurse Practitioner | Admitting: Nurse Practitioner

## 2023-11-01 ENCOUNTER — Ambulatory Visit: Payer: Self-pay | Attending: Nurse Practitioner | Admitting: Nurse Practitioner

## 2023-11-01 ENCOUNTER — Other Ambulatory Visit: Payer: Self-pay

## 2023-11-01 ENCOUNTER — Encounter: Payer: Self-pay | Admitting: Nurse Practitioner

## 2023-11-01 VITALS — BP 107/71 | HR 70 | Ht 60.0 in | Wt 176.2 lb

## 2023-11-01 DIAGNOSIS — R102 Pelvic and perineal pain: Secondary | ICD-10-CM

## 2023-11-01 DIAGNOSIS — G44209 Tension-type headache, unspecified, not intractable: Secondary | ICD-10-CM

## 2023-11-01 DIAGNOSIS — R002 Palpitations: Secondary | ICD-10-CM

## 2023-11-01 DIAGNOSIS — Z0000 Encounter for general adult medical examination without abnormal findings: Secondary | ICD-10-CM

## 2023-11-01 MED ORDER — TOPIRAMATE 25 MG PO TABS
25.0000 mg | ORAL_TABLET | Freq: Every day | ORAL | 1 refills | Status: AC
  Filled 2023-11-01: qty 30, 15d supply, fill #0

## 2023-11-01 NOTE — Progress Notes (Signed)
Assessment & Plan:  Amy Lloyd was seen today for annual exam.  Diagnoses and all orders for this visit:  Annual physical exam -     CBC with Differential -     CMP14+EGFR  Pelvic pain -     US PELVIC COMPLETE WITH TRANSVAGINAL; Future -     Cervicovaginal ancillary only  Palpitation -     Thyroid Panel With TSH  Acute non intractable tension-type headache -     topiramate (TOPAMAX) 25 MG tablet; Take 1-2 tablets (25-50 mg total) by mouth daily. For headaches    Patient has been counseled on age-appropriate routine health concerns for screening and prevention. These are reviewed and up-to-date. Referrals have been placed accordingly. Immunizations are up-to-date or declined.    Subjective:   Chief Complaint  Patient presents with   Annual Exam    Amy Lloyd 37 y.o. female presents to office today for annual physical exam.    Notes pelvic pain/lower abdominal pain unrelated to her menstrual cycles. Denies constipation or diarrhea. Abdominal/pelvic CT in the past was negative. She does have a history of acute gastritis w/o hemorrhage, cholecystectomy and appendectomy.  Past medications include: zofran, PPI and h2 antagonist. She is currently not taking any of these medications at this time.    She has a history of non intractable headaches occurring on and off for the past several years. Not taking any medications currently for this. Associated symptoms; photophobia. Has tried tylenol in the past.    Review of Systems  Constitutional:  Negative for fever, malaise/fatigue and weight loss.  HENT: Negative.  Negative for nosebleeds.   Eyes: Negative.  Negative for blurred vision, double vision and photophobia.  Respiratory: Negative.  Negative for cough and shortness of breath.   Cardiovascular: Negative.  Negative for chest pain, palpitations and leg swelling.  Gastrointestinal:  Positive for abdominal pain. Negative for blood in stool, constipation, diarrhea,  heartburn, melena, nausea and vomiting.  Genitourinary: Negative.   Musculoskeletal: Negative.  Negative for myalgias.  Skin: Negative.   Neurological:  Positive for headaches. Negative for dizziness, focal weakness and seizures.  Endo/Heme/Allergies: Negative.   Psychiatric/Behavioral: Negative.  Negative for suicidal ideas.     Past Medical History:  Diagnosis Date   Gallbladder attack    GERD (gastroesophageal reflux disease)     Past Surgical History:  Procedure Laterality Date   APPENDECTOMY  09/26/2015   "lap"   CHOLECYSTECTOMY N/A 11/12/2017   Procedure: LAPAROSCOPIC CHOLECYSTECTOMY;  Surgeon: Emelia Loron, MD;  Location: [facility] OR;  Service: General;  Laterality: N/A;   LAPAROSCOPIC APPENDECTOMY N/A 09/26/2015   Procedure: APPENDECTOMY LAPAROSCOPIC;  Surgeon: Emelia Loron, MD;  Location: [facility] OR;  Service: General;  Laterality: N/A;    History reviewed. No pertinent family history.  Social History Reviewed with no changes to be made today.   Outpatient Medications Prior to Visit  Medication Sig Dispense Refill   acetaminophen (TYLENOL) 325 MG tablet Take 2 tablets (650 mg total) by mouth every 6 (six) hours as needed (pain).  0   ferrous sulfate (FERROUSUL) 325 (65 FE) MG tablet Take 1 tablet (325 mg total) by mouth every other day. 60 tablet 1   ibuprofen (ADVIL) 200 MG tablet Take 2 tablets (400 mg total) by mouth every 6 (six) hours as needed (pain). (Patient not taking: Reported on 09/19/2021)     prenatal vitamin w/FE, FA (PRENATAL 1 + 1) 27-1 MG TABS tablet Take 1 tablet by mouth daily at 12 noon. (Patient  not taking: Reported on 11/01/2023)     No facility-administered medications prior to visit.    No Known Allergies     Objective:    BP 107/71 (BP Location: Left Arm, Patient Position: Sitting, Cuff Size: Normal)   Pulse 70   Ht 5' (1.524 m)   Wt 176 lb 3.2 oz (79.9 kg)   LMP 09/01/2023 (Approximate) Comment: Patient has IUD  SpO2 98%    Breastfeeding No   BMI 34.41 kg/m  Wt Readings from Last 3 Encounters:  11/01/23 176 lb 3.2 oz (79.9 kg)  09/19/21 168 lb 6.4 oz (76.4 kg)  08/01/21 192 lb 3.2 oz (87.2 kg)    Physical Exam Constitutional:      Appearance: She is well-developed.  HENT:     Head: Normocephalic and atraumatic.     Right Ear: Hearing, tympanic membrane, ear canal and external ear normal.     Left Ear: Hearing, tympanic membrane, ear canal and external ear normal.     Nose: Nose normal.     Right Turbinates: Not enlarged.     Left Turbinates: Not enlarged.     Mouth/Throat:     Lips: Pink.     Mouth: Mucous membranes are moist.     Dentition: No dental tenderness, gingival swelling, dental abscesses or gum lesions.     Pharynx: No oropharyngeal exudate.  Eyes:     General: No scleral icterus.       Right eye: No discharge.     Extraocular Movements: Extraocular movements intact.     Conjunctiva/sclera: Conjunctivae normal.     Pupils: Pupils are equal, round, and reactive to light.  Neck:     Thyroid: No thyromegaly.     Trachea: No tracheal deviation.  Cardiovascular:     Rate and Rhythm: Normal rate and regular rhythm.     Heart sounds: Normal heart sounds. No murmur heard.    No friction rub.  Pulmonary:     Effort: Pulmonary effort is normal. No accessory muscle usage or respiratory distress.     Breath sounds: Normal breath sounds. No decreased breath sounds, wheezing, rhonchi or rales.  Abdominal:     General: Bowel sounds are normal. There is no distension.     Palpations: Abdomen is soft. There is no mass.     Tenderness: There is no abdominal tenderness. There is no right CVA tenderness, left CVA tenderness, guarding or rebound.     Hernia: No hernia is present.  Musculoskeletal:        General: No tenderness or deformity. Normal range of motion.     Cervical back: Normal range of motion and neck supple.  Lymphadenopathy:     Cervical: No cervical adenopathy.  Skin:    General:  Skin is warm and dry.     Findings: No erythema.  Neurological:     Mental Status: She is alert and oriented to person, place, and time.     Cranial Nerves: No cranial nerve deficit.     Motor: Motor function is intact.     Coordination: Coordination is intact. Coordination normal.     Gait: Gait is intact.     Deep Tendon Reflexes:     Reflex Scores:      Patellar reflexes are 1+ on the right side and 1+ on the left side. Psychiatric:        Attention and Perception: Attention normal.        Mood and Affect: Mood normal.  Speech: Speech normal.        Behavior: Behavior normal.        Thought Content: Thought content normal.        Judgment: Judgment normal.          Patient has been counseled extensively about nutrition and exercise as well as the importance of adherence with medications and regular follow-up. The patient was given clear instructions to go to ER or return to medical center if symptoms don't improve, worsen or new problems develop. The patient verbalized understanding.   Follow-up: No follow-ups on file.   Claiborne Rigg, FNP-BC [facility] [place], [place] [phone]   11/01/2023, 3:33 PM

## 2023-11-02 LAB — CMP14+EGFR
ALT: 29 [IU]/L (ref 0–32)
AST: 24 [IU]/L (ref 0–40)
Albumin: 4 g/dL (ref 3.9–4.9)
Alkaline Phosphatase: 101 [IU]/L (ref 44–121)
BUN/Creatinine Ratio: 16 (ref 9–23)
BUN: 11 mg/dL (ref 6–20)
Bilirubin Total: 0.5 mg/dL (ref 0.0–1.2)
CO2: 22 mmol/L (ref 20–29)
Calcium: 8.6 mg/dL — ABNORMAL LOW (ref 8.7–10.2)
Chloride: 104 mmol/L (ref 96–106)
Creatinine, Ser: 0.7 mg/dL (ref 0.57–1.00)
Globulin, Total: 2.7 g/dL (ref 1.5–4.5)
Glucose: 87 mg/dL (ref 70–99)
Potassium: 3.7 mmol/L (ref 3.5–5.2)
Sodium: 137 mmol/L (ref 134–144)
Total Protein: 6.7 g/dL (ref 6.0–8.5)
eGFR: 115 mL/min/{1.73_m2} (ref >59)

## 2023-11-02 LAB — CBC WITH DIFFERENTIAL/PLATELET
Basophils Absolute: 0 10*3/uL (ref 0.0–0.2)
Basos: 0 %
EOS (ABSOLUTE): 0.1 10*3/uL (ref 0.0–0.4)
Eos: 1 %
Hematocrit: 40.9 % (ref 34.0–46.6)
Hemoglobin: 13.9 g/dL (ref 11.1–15.9)
Immature Grans (Abs): 0 10*3/uL (ref 0.0–0.1)
Immature Granulocytes: 1 %
Lymphocytes Absolute: 1.8 10*3/uL (ref 0.7–3.1)
Lymphs: 28 %
MCH: 31.4 pg (ref 26.6–33.0)
MCHC: 34 g/dL (ref 31.5–35.7)
MCV: 92 fL (ref 79–97)
Monocytes Absolute: 0.4 10*3/uL (ref 0.1–0.9)
Monocytes: 6 %
Neutrophils Absolute: 4.2 10*3/uL (ref 1.4–7.0)
Neutrophils: 64 %
Platelets: 190 10*3/uL (ref 150–450)
RBC: 4.43 x10E6/uL (ref 3.77–5.28)
RDW: 12.2 % (ref 11.7–15.4)
WBC: 6.5 10*3/uL (ref 3.4–10.8)

## 2023-11-02 LAB — THYROID PANEL WITH TSH
Free Thyroxine Index: 1.7 (ref 1.2–4.9)
T3 Uptake Ratio: 25 % (ref 24–39)
T4, Total: 6.6 ug/dL (ref 4.5–12.0)
TSH: 2.01 u[IU]/mL (ref 0.450–4.500)

## 2023-11-04 LAB — CERVICOVAGINAL ANCILLARY ONLY
Bacterial Vaginitis (gardnerella): NEGATIVE
Candida Glabrata: NEGATIVE
Candida Vaginitis: NEGATIVE
Chlamydia: NEGATIVE
Comment: NEGATIVE
Comment: NEGATIVE
Comment: NEGATIVE
Comment: NEGATIVE
Comment: NEGATIVE
Comment: NORMAL
Neisseria Gonorrhea: NEGATIVE
Trichomonas: NEGATIVE

## 2023-11-18 ENCOUNTER — Encounter: Payer: Self-pay | Admitting: Nurse Practitioner
